# Patient Record
Sex: Male | Born: 1945 | Race: White | Hispanic: No | Smoking: Current every day smoker
Health system: Southern US, Community
[De-identification: ages and names within clinical notes are randomized; demographics above are authoritative.]

## PROBLEM LIST (undated history)

## (undated) DIAGNOSIS — I10 Essential (primary) hypertension: Secondary | ICD-10-CM

## (undated) DIAGNOSIS — R6520 Severe sepsis without septic shock: Secondary | ICD-10-CM

## (undated) DIAGNOSIS — A419 Sepsis, unspecified organism: Secondary | ICD-10-CM

## (undated) DIAGNOSIS — I214 Non-ST elevation (NSTEMI) myocardial infarction: Secondary | ICD-10-CM

## (undated) DIAGNOSIS — K5010 Crohn's disease of large intestine without complications: Secondary | ICD-10-CM

## (undated) DIAGNOSIS — I5021 Acute systolic (congestive) heart failure: Secondary | ICD-10-CM

## (undated) DIAGNOSIS — E119 Type 2 diabetes mellitus without complications: Secondary | ICD-10-CM

## (undated) DIAGNOSIS — I4891 Unspecified atrial fibrillation: Secondary | ICD-10-CM

## (undated) DIAGNOSIS — J9621 Acute and chronic respiratory failure with hypoxia: Secondary | ICD-10-CM

## (undated) DIAGNOSIS — I482 Chronic atrial fibrillation, unspecified: Secondary | ICD-10-CM

## (undated) HISTORY — PX: CAROTID ENDARTERECTOMY: SUR193

---

## 2018-04-26 ENCOUNTER — Inpatient Hospital Stay
Admission: RE | Admit: 2018-04-26 | Discharge: 2018-05-13 | Disposition: A | Discharge status: Home / Self Care | Payer: Medicare HMO | Source: Other Acute Inpatient Hospital | Attending: Internal Medicine | Admitting: Internal Medicine

## 2018-04-26 ENCOUNTER — Other Ambulatory Visit (HOSPITAL_COMMUNITY): Payer: Self-pay

## 2018-04-26 DIAGNOSIS — R140 Abdominal distension (gaseous): Secondary | ICD-10-CM

## 2018-04-26 DIAGNOSIS — R6520 Severe sepsis without septic shock: Secondary | ICD-10-CM

## 2018-04-26 DIAGNOSIS — I482 Chronic atrial fibrillation, unspecified: Secondary | ICD-10-CM | POA: Diagnosis present

## 2018-04-26 DIAGNOSIS — J189 Pneumonia, unspecified organism: Secondary | ICD-10-CM

## 2018-04-26 DIAGNOSIS — J9690 Respiratory failure, unspecified, unspecified whether with hypoxia or hypercapnia: Secondary | ICD-10-CM

## 2018-04-26 DIAGNOSIS — Z978 Presence of other specified devices: Secondary | ICD-10-CM

## 2018-04-26 DIAGNOSIS — Z4659 Encounter for fitting and adjustment of other gastrointestinal appliance and device: Secondary | ICD-10-CM

## 2018-04-26 DIAGNOSIS — Z9289 Personal history of other medical treatment: Secondary | ICD-10-CM

## 2018-04-26 DIAGNOSIS — A419 Sepsis, unspecified organism: Secondary | ICD-10-CM | POA: Diagnosis present

## 2018-04-26 DIAGNOSIS — I5021 Acute systolic (congestive) heart failure: Secondary | ICD-10-CM | POA: Diagnosis present

## 2018-04-26 DIAGNOSIS — I509 Heart failure, unspecified: Secondary | ICD-10-CM

## 2018-04-26 DIAGNOSIS — J9621 Acute and chronic respiratory failure with hypoxia: Secondary | ICD-10-CM | POA: Diagnosis present

## 2018-04-26 DIAGNOSIS — I214 Non-ST elevation (NSTEMI) myocardial infarction: Secondary | ICD-10-CM | POA: Diagnosis present

## 2018-04-26 DIAGNOSIS — Z9911 Dependence on respirator [ventilator] status: Secondary | ICD-10-CM

## 2018-04-26 HISTORY — DX: Acute systolic (congestive) heart failure: I50.21

## 2018-04-26 HISTORY — DX: Sepsis, unspecified organism: A41.9

## 2018-04-26 HISTORY — DX: Chronic atrial fibrillation, unspecified: I48.20

## 2018-04-26 HISTORY — DX: Severe sepsis without septic shock: R65.20

## 2018-04-26 HISTORY — DX: Acute and chronic respiratory failure with hypoxia: J96.21

## 2018-04-26 HISTORY — DX: Non-ST elevation (NSTEMI) myocardial infarction: I21.4

## 2018-04-27 LAB — COMPREHENSIVE METABOLIC PANEL
ALT: 13 U/L (ref 0–44)
AST: 17 U/L (ref 15–41)
Albumin: 2.5 g/dL — ABNORMAL LOW (ref 3.5–5.0)
Alkaline Phosphatase: 38 U/L (ref 38–126)
Anion gap: 6 (ref 5–15)
BUN: 71 mg/dL — AB (ref 8–23)
CHLORIDE: 118 mmol/L — AB (ref 98–111)
CO2: 19 mmol/L — ABNORMAL LOW (ref 22–32)
Calcium: 8.2 mg/dL — ABNORMAL LOW (ref 8.9–10.3)
Creatinine, Ser: 1.81 mg/dL — ABNORMAL HIGH (ref 0.61–1.24)
GFR calc Af Amer: 42 mL/min — ABNORMAL LOW (ref >60)
GFR calc non Af Amer: 37 mL/min — ABNORMAL LOW (ref >60)
Glucose, Bld: 80 mg/dL (ref 70–99)
Potassium: 4.5 mmol/L (ref 3.5–5.1)
Sodium: 143 mmol/L (ref 135–145)
Total Bilirubin: 0.4 mg/dL (ref 0.3–1.2)
Total Protein: 5.3 g/dL — ABNORMAL LOW (ref 6.5–8.1)

## 2018-04-27 LAB — BLOOD GAS, ARTERIAL
ACID-BASE DEFICIT: 4.6 mmol/L — AB (ref 0.0–2.0)
Acid-base deficit: 4.2 mmol/L — ABNORMAL HIGH (ref 0.0–2.0)
Acid-base deficit: 5.2 mmol/L — ABNORMAL HIGH (ref 0.0–2.0)
Acid-base deficit: 3.1 mmol/L — ABNORMAL HIGH (ref 0.0–2.0)
BICARBONATE: 21.9 mmol/L (ref 20.0–28.0)
Bicarbonate: 22.5 mmol/L (ref 20.0–28.0)
Bicarbonate: 21.3 mmol/L (ref 20.0–28.0)
Bicarbonate: 21.9 mmol/L (ref 20.0–28.0)
FIO2: 60
FIO2: 0.6
FIO2: 0.7
FIO2: 60
LHR: 16 {breaths}/min
MECHVT: 440 mL
MECHVT: 440 mL
MECHVT: 440 mL
MECHVT: 440 mL
O2 Saturation: 90.3 %
O2 Saturation: 94.3 %
O2 Saturation: 92 %
O2 Saturation: 98.1 %
PATIENT TEMPERATURE: 98.6
PCO2 ART: 43.2 mmHg (ref 32.0–48.0)
PEEP: 5 cmH2O
PEEP: 5 cmH2O
PEEP: 8 cmH2O
PEEP: 8 cmH2O
Patient temperature: 98.6
Patient temperature: 98.6
Patient temperature: 98.6
RATE: 16 resp/min
RATE: 20 resp/min
RATE: 24 resp/min
pCO2 arterial: 57.6 mmHg — ABNORMAL HIGH (ref 32.0–48.0)
pCO2 arterial: 55.2 mmHg — ABNORMAL HIGH (ref 32.0–48.0)
pCO2 arterial: 53.8 mmHg — ABNORMAL HIGH (ref 32.0–48.0)
pH, Arterial: 7.216 — ABNORMAL LOW (ref 7.350–7.450)
pH, Arterial: 7.222 — ABNORMAL LOW (ref 7.350–7.450)
pH, Arterial: 7.221 — ABNORMAL LOW (ref 7.350–7.450)
pH, Arterial: 7.326 — ABNORMAL LOW (ref 7.350–7.450)
pO2, Arterial: 66.2 mmHg — ABNORMAL LOW (ref 83.0–108.0)
pO2, Arterial: 77 mmHg — ABNORMAL LOW (ref 83.0–108.0)
pO2, Arterial: 69 mmHg — ABNORMAL LOW (ref 83.0–108.0)
pO2, Arterial: 104 mmHg (ref 83.0–108.0)

## 2018-04-27 LAB — URINALYSIS, ROUTINE W REFLEX MICROSCOPIC
Bilirubin Urine: NEGATIVE
Glucose, UA: NEGATIVE mg/dL
Ketones, ur: NEGATIVE mg/dL
Leukocytes,Ua: NEGATIVE
Nitrite: NEGATIVE
Protein, ur: 30 mg/dL — AB
Specific Gravity, Urine: 1.015 (ref 1.005–1.030)
pH: 5 (ref 5.0–8.0)

## 2018-04-27 LAB — CBC
HCT: 31.8 % — ABNORMAL LOW (ref 39.0–52.0)
Hemoglobin: 9.6 g/dL — ABNORMAL LOW (ref 13.0–17.0)
MCH: 27.9 pg (ref 26.0–34.0)
MCHC: 30.2 g/dL (ref 30.0–36.0)
MCV: 92.4 fL (ref 80.0–100.0)
Platelets: 143 10*3/uL — ABNORMAL LOW (ref 150–400)
RBC: 3.44 MIL/uL — ABNORMAL LOW (ref 4.22–5.81)
RDW: 16.1 % — ABNORMAL HIGH (ref 11.5–15.5)
WBC: 9.1 10*3/uL (ref 4.0–10.5)
nRBC: 0 % (ref 0.0–0.2)

## 2018-04-27 NOTE — Consult Note (Signed)
Referring Physician: Hijazi/Li  Richard Wang is an 73 y.o. male.                       Chief Complaint: CAD and CHF  HPI: 73 year old male has acute on chronic respiratory failure following CAD, NSTEMI and right lung pneumonia. He has PMH of hypertenion, CKD, II and COPD. His echocardiogram done at [facility] center showed moderate LV Systolic dysfunction with moderate to severe MR and moderate pulmonary systolic hypertension.  Past medical history as above.    The histories are not reviewed yet. Please review them in the "History" navigator section and refresh this SmartLink.  No family history on file. Social History:  has no history on file for tobacco, alcohol, and drug.  Allergies: Allergies not on file  No medications prior to admission.  See MAR.  Results for orders placed or performed during the hospital encounter of 04/26/18 (from the past 48 hour(s))  Blood gas, arterial     Status: Abnormal   Collection Time: 04/26/18 12:58 AM  Result Value Ref Range   FIO2 60.00    Delivery systems VENTILATOR    Mode PRESSURE REGULATED VOLUME CONTROL    VT 440 mL   LHR 16 resp/min   Peep/cpap 5.0 cm H20   pH, Arterial 7.216 (L) 7.350 - 7.450   pCO2 arterial 57.6 (H) 32.0 - 48.0 mmHg   pO2, Arterial 66.2 (L) 83.0 - 108.0 mmHg   Bicarbonate 22.5 20.0 - 28.0 mmol/L   Acid-base deficit 4.2 (H) 0.0 - 2.0 mmol/L   O2 Saturation 90.3 %   Patient temperature 98.6    Collection site RIGHT RADIAL    Drawn by COLLECTED BY RT    Sample type ARTERIAL DRAW    Allens test (pass/fail) PASS PASS  Comprehensive metabolic panel     Status: Abnormal   Collection Time: 04/27/18  1:24 AM  Result Value Ref Range   Sodium 143 135 - 145 mmol/L   Potassium 4.5 3.5 - 5.1 mmol/L   Chloride 118 (H) 98 - 111 mmol/L   CO2 19 (L) 22 - 32 mmol/L   Glucose, Bld 80 70 - 99 mg/dL   BUN 71 (H) 8 - 23 mg/dL   Creatinine, Ser 1.49 (H) 0.61 - 1.24 mg/dL   Calcium 8.2 (L) 8.9 - 10.3 mg/dL   Total  Protein 5.3 (L) 6.5 - 8.1 g/dL   Albumin 2.5 (L) 3.5 - 5.0 g/dL   AST 17 15 - 41 U/L   ALT 13 0 - 44 U/L   Alkaline Phosphatase 38 38 - 126 U/L   Total Bilirubin 0.4 0.3 - 1.2 mg/dL   GFR calc non Af Amer 37 (L) >60 mL/min   GFR calc Af Amer 42 (L) >60 mL/min   Anion gap 6 5 - 15    Comment: Performed at [facility], [address] [place], [place] [address]  CBC     Status: Abnormal   Collection Time: 04/27/18  1:24 AM  Result Value Ref Range   WBC 9.1 4.0 - 10.5 K/uL   RBC 3.44 (L) 4.22 - 5.81 MIL/uL   Hemoglobin 9.6 (L) 13.0 - 17.0 g/dL   HCT 78.5 (L) 88.5 - 02.7 %   MCV 92.4 80.0 - 100.0 fL   MCH 27.9 26.0 - 34.0 pg   MCHC 30.2 30.0 - 36.0 g/dL   RDW 74.1 (H) 28.7 - 86.7 %   Platelets 143 (  L) 150 - 400 K/uL   nRBC 0.0 0.0 - 0.2 %    Comment: Performed at [facility], [address], [place], [place] [address]  Blood gas, arterial     Status: Abnormal   Collection Time: 04/27/18  4:02 AM  Result Value Ref Range   FIO2 0.60    Delivery systems VENTILATOR    Mode ASSIST CONTROL    VT 440 mL   LHR 16 resp/min   Peep/cpap 5.0 cm H20   pH, Arterial 7.222 (L) 7.350 - 7.450   pCO2 arterial 55.2 (H) 32.0 - 48.0 mmHg   pO2, Arterial 77.0 (L) 83.0 - 108.0 mmHg   Bicarbonate 21.9 20.0 - 28.0 mmol/L   Acid-base deficit 4.6 (H) 0.0 - 2.0 mmol/L   O2 Saturation 94.3 %   Patient temperature 98.6    Collection site RIGHT RADIAL    Drawn by COLLECTED BY RT    Sample type ARTERIAL DRAW    Allens test (pass/fail) PASS PASS  Blood gas, arterial     Status: Abnormal   Collection Time: 04/27/18  6:42 AM  Result Value Ref Range   FIO2 0.70    Delivery systems VENTILATOR    Mode ASSIST CONTROL    VT 440 mL   LHR 20 resp/min   Peep/cpap 8.0 cm H20   pH, Arterial 7.221 (L) 7.350 - 7.450   pCO2 arterial 53.8 (H) 32.0 - 48.0 mmHg   pO2, Arterial 69.0 (L) 83.0 - 108.0 mmHg   Bicarbonate 21.3 20.0 - 28.0 mmol/L   Acid-base deficit 5.2 (H) 0.0 - 2.0 mmol/L   O2 Saturation  92.0 %   Patient temperature 98.6    Collection site RIGHT RADIAL    Drawn by COLLECTED BY RT    Sample type ARTERIAL DRAW    Allens test (pass/fail) PASS PASS  Culture, respiratory     Status: None (Preliminary result)   Collection Time: 04/27/18 12:20 PM  Result Value Ref Range   Specimen Description TRACHEAL ASPIRATE    Special Requests NONE    Gram Stain      FEW WBC PRESENT,BOTH PMN AND MONONUCLEAR NO ORGANISMS SEEN Performed at [facility], [address] [place], [place] [address]    Culture PENDING    Report Status PENDING   Urinalysis, Routine w reflex microscopic     Status: Abnormal   Collection Time: 04/27/18  1:20 PM  Result Value Ref Range   Color, Urine YELLOW YELLOW   APPearance CLEAR CLEAR   Specific Gravity, Urine 1.015 1.005 - 1.030   pH 5.0 5.0 - 8.0   Glucose, UA NEGATIVE NEGATIVE mg/dL   Hgb urine dipstick MODERATE (A) NEGATIVE   Bilirubin Urine NEGATIVE NEGATIVE   Ketones, ur NEGATIVE NEGATIVE mg/dL   Protein, ur 30 (A) NEGATIVE mg/dL   Nitrite NEGATIVE NEGATIVE   Leukocytes,Ua NEGATIVE NEGATIVE   RBC / HPF 21-50 0 - 5 RBC/hpf   WBC, UA 6-10 0 - 5 WBC/hpf   Bacteria, UA FEW (A) NONE SEEN   Mucus PRESENT     Comment: Performed at [facility], [address], [place], [place] [address]   Dg Chest Port 1 View  Result Date: 04/26/2018 CLINICAL DATA:  Endotracheal tube placement. EXAM: PORTABLE CHEST 1 VIEW COMPARISON:  Chest radiograph May 12, 2014 FINDINGS: Endotracheal tube tip projects 3.9 cm above the carina. Nasogastric tube past distal esophagus, tip out of field of view. Lung bases out of field  of view. Cardiomediastinal silhouette is normal. Calcified aortic arch. Diffuse interstitial and alveolar airspace opacities. Limited assessment for pleural effusion. No pneumothorax. Surgical clips versus calcifications in the neck. IMPRESSION: 1. Endotracheal tube tip projects 3.9 cm above the carina, nasogastric tube past distal esophagus. 2.  Interstitial and alveolar airspace opacities seen with pulmonary edema and/or infection. 3.  Aortic Atherosclerosis (ICD10-I70.0). Electronically Signed   By: Awilda Metro M.D.   On: 04/26/2018 23:34   Dg Abd Portable 1v  Result Date: 04/26/2018 CLINICAL DATA:  NG tube placement EXAM: PORTABLE ABDOMEN - 1 VIEW COMPARISON:  CT 03/21/2016 FINDINGS: Bilateral pleural effusions and basilar airspace disease. Aortic atherosclerosis. Esophageal tube tip in the low mid abdominal region, presumably over the mid stomach. Gasless abdomen IMPRESSION: 1. Esophageal tube tip projects over the mid gastric region 2. Bilateral pleural effusions and basilar airspace disease Electronically Signed   By: Jasmine Pang M.D.   On: 04/26/2018 23:35    Review Of Systems Unable to obtain and as per PMH.   There were no vitals taken for this visit. There is no height or weight on file to calculate BMI. General appearance: Sedated and trached. Appears stated age and no distress Head: Normocephalic, atraumatic. Eyes: pink conjunctiva, corneas clear.  Neck: No adenopathy, no carotid bruit, no JVD, supple, symmetrical, trachea midline and thyroid not enlarged.  Resp: Clearing to auscultation bilaterally. Cardio: Irregular rate and rhythm, S1, S2 normal, III/VI systolic murmur, no click, rub or gallop GI: Soft, non-tender; bowel sounds normal; no organomegaly. Extremities: 1 + edema, cyanosis or clubbing. Skin: Warm and dry.  Neurologic: Alert and oriented X 0, normal strength.   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 Add diltiazem 30 mg per tube twice daily and increase dose as needed for heart rate control.   Ricki Rodriguez, MD  04/27/2018, 4:57 PM

## 2018-04-28 ENCOUNTER — Other Ambulatory Visit (HOSPITAL_COMMUNITY): Payer: Self-pay

## 2018-04-28 LAB — RENAL FUNCTION PANEL
Albumin: 2.3 g/dL — ABNORMAL LOW (ref 3.5–5.0)
Anion gap: 7 (ref 5–15)
BUN: 70 mg/dL — ABNORMAL HIGH (ref 8–23)
CHLORIDE: 113 mmol/L — AB (ref 98–111)
CO2: 22 mmol/L (ref 22–32)
Calcium: 8.6 mg/dL — ABNORMAL LOW (ref 8.9–10.3)
Creatinine, Ser: 1.48 mg/dL — ABNORMAL HIGH (ref 0.61–1.24)
GFR calc Af Amer: 54 mL/min — ABNORMAL LOW (ref >60)
GFR calc non Af Amer: 47 mL/min — ABNORMAL LOW (ref >60)
Glucose, Bld: 135 mg/dL — ABNORMAL HIGH (ref 70–99)
Phosphorus: 4.8 mg/dL — ABNORMAL HIGH (ref 2.5–4.6)
Potassium: 5.5 mmol/L — ABNORMAL HIGH (ref 3.5–5.1)
Sodium: 142 mmol/L (ref 135–145)

## 2018-04-28 LAB — BRAIN NATRIURETIC PEPTIDE: B Natriuretic Peptide: 1239.6 pg/mL — ABNORMAL HIGH (ref 0.0–100.0)

## 2018-04-28 LAB — TSH: TSH: 0.109 u[IU]/mL — ABNORMAL LOW (ref 0.350–4.500)

## 2018-04-28 LAB — URINE CULTURE: Culture: NO GROWTH

## 2018-04-28 LAB — CBC
HCT: 30 % — ABNORMAL LOW (ref 39.0–52.0)
Hemoglobin: 9 g/dL — ABNORMAL LOW (ref 13.0–17.0)
MCH: 27.1 pg (ref 26.0–34.0)
MCHC: 30 g/dL (ref 30.0–36.0)
MCV: 90.4 fL (ref 80.0–100.0)
Platelets: 165 10*3/uL (ref 150–400)
RBC: 3.32 MIL/uL — ABNORMAL LOW (ref 4.22–5.81)
RDW: 15.8 % — ABNORMAL HIGH (ref 11.5–15.5)
WBC: 8.9 10*3/uL (ref 4.0–10.5)
nRBC: 0 % (ref 0.0–0.2)

## 2018-04-28 LAB — MAGNESIUM: MAGNESIUM: 1.9 mg/dL (ref 1.7–2.4)

## 2018-04-28 LAB — HEMOGLOBIN A1C
Hgb A1c MFr Bld: 5 % (ref 4.8–5.6)
Mean Plasma Glucose: 96.8 mg/dL

## 2018-04-29 DIAGNOSIS — I5021 Acute systolic (congestive) heart failure: Secondary | ICD-10-CM

## 2018-04-29 DIAGNOSIS — J9621 Acute and chronic respiratory failure with hypoxia: Secondary | ICD-10-CM | POA: Diagnosis not present

## 2018-04-29 DIAGNOSIS — R6520 Severe sepsis without septic shock: Secondary | ICD-10-CM

## 2018-04-29 DIAGNOSIS — A419 Sepsis, unspecified organism: Secondary | ICD-10-CM

## 2018-04-29 DIAGNOSIS — I214 Non-ST elevation (NSTEMI) myocardial infarction: Secondary | ICD-10-CM

## 2018-04-29 DIAGNOSIS — I482 Chronic atrial fibrillation, unspecified: Secondary | ICD-10-CM

## 2018-04-29 LAB — RENAL FUNCTION PANEL
Albumin: 2.5 g/dL — ABNORMAL LOW (ref 3.5–5.0)
Anion gap: 6 (ref 5–15)
BUN: 73 mg/dL — ABNORMAL HIGH (ref 8–23)
CO2: 25 mmol/L (ref 22–32)
Calcium: 8.8 mg/dL — ABNORMAL LOW (ref 8.9–10.3)
Chloride: 111 mmol/L (ref 98–111)
Creatinine, Ser: 1.51 mg/dL — ABNORMAL HIGH (ref 0.61–1.24)
GFR calc Af Amer: 53 mL/min — ABNORMAL LOW (ref >60)
GFR calc non Af Amer: 45 mL/min — ABNORMAL LOW (ref >60)
Glucose, Bld: 139 mg/dL — ABNORMAL HIGH (ref 70–99)
Phosphorus: 5.2 mg/dL — ABNORMAL HIGH (ref 2.5–4.6)
Potassium: 5.2 mmol/L — ABNORMAL HIGH (ref 3.5–5.1)
Sodium: 142 mmol/L (ref 135–145)

## 2018-04-29 LAB — MAGNESIUM: MAGNESIUM: 2 mg/dL (ref 1.7–2.4)

## 2018-04-29 LAB — CULTURE, RESPIRATORY W GRAM STAIN: Culture: NO GROWTH

## 2018-04-29 LAB — T4, FREE: Free T4: 1.25 ng/dL (ref 0.82–1.77)

## 2018-04-29 LAB — CBC
HCT: 31.1 % — ABNORMAL LOW (ref 39.0–52.0)
Hemoglobin: 9.5 g/dL — ABNORMAL LOW (ref 13.0–17.0)
MCH: 27.3 pg (ref 26.0–34.0)
MCHC: 30.5 g/dL (ref 30.0–36.0)
MCV: 89.4 fL (ref 80.0–100.0)
Platelets: 162 10*3/uL (ref 150–400)
RBC: 3.48 MIL/uL — ABNORMAL LOW (ref 4.22–5.81)
RDW: 15.5 % (ref 11.5–15.5)
WBC: 9.3 10*3/uL (ref 4.0–10.5)
nRBC: 0.2 % (ref 0.0–0.2)

## 2018-04-29 NOTE — Consult Note (Addendum)
Pulmonary Critical Care Medicine [facility] [place]  PULMONARY SERVICE  Date of Service: 04/29/2018  PULMONARY CRITICAL CARE Richard Wang  YEM:[id number]  DOB: [birth year]   DOA: 04/26/2018  Referring Physician: Carron Curie, MD  HPI: Richard Wang is a 73 y.o. male seen for follow up of Acute on Chronic Respiratory Failure.  Patient has a past medical history significant for COPD pneumonia coronary artery disease non-STEMI renal insufficiency atrial fibrillation hypertension.  Patient was admitted with increasing respiratory distress was started on BiPAP however patient deteriorated and ended up having to be intubated placed on the ventilator.  Subsequently had complications associated with worsening renal function congestive heart failure with reduced ejection fraction pulmonary hypertension.  Patient was attempted extubation however failed and eventually transferred to our facility for further management.  Patient remains on propofol and fentanyl for sedation at this time  Review of Systems:  ROS performed and is unremarkable other than noted above.  Past Medical History:  Diagnosis Date  . Cancer (HCC)  . Hypertension  . Thrombophlebitis of deep femoral vein (HCC)   Past Surgical History:  Procedure Laterality Date  . LITHOTRIPSY  Renal  . PROSTATE SURGERY   Family History  Problem Relation Age of Onset  . Glaucoma Neg Hx  . Macular degeneration Neg Hx   Social History   Socioeconomic History  . Marital status: Married  Spouse name: Not on file  . Number of children: Not on file  . Years of education: Not on file  . Highest education level: Not on file  Occupational History  . Not on file  Social Needs  . Financial resource strain: Not on file  . Food insecurity:  Worry: Not on file  Inability: Not on file  . Transportation needs:  Medical: Not on file  Non-medical: Not on file  Tobacco Use  . Smoking status: Former Smoker  Last attempt to  quit: 05/23/1967  Years since quitting: 50.8  . Smokeless tobacco: Never Used  Substance and Sexual Activity  . Alcohol use: No  . Drug use: No    Medications: Reviewed on Rounds  Physical Exam:  Vitals: Temperature 98.0 pulse 85 respiratory 24 blood pressure 128/79 saturations 100%  Ventilator Settings mode of ventilation assist control FiO2 60% tidal volume 457 PEEP 8  . General: Comfortable at this time . Eyes: Grossly normal lids, irises & conjunctiva . ENT: grossly tongue is normal . Neck: no obvious mass . Cardiovascular: S1-S2 normal no gallop or rub is noted . Respiratory: Coarse breath sounds with few scattered rhonchi . Abdomen: Soft and nontender . Skin: no rash seen on limited exam . Musculoskeletal: not rigid . Psychiatric:unable to assess . Neurologic: no seizure no involuntary movements         Labs on Admission:  Basic Metabolic Panel: Recent Labs  Lab 04/27/18 0124 04/28/18 1052 04/29/18 0453  NA 143 142 142  K 4.5 5.5* 5.2*  CL 118* 113* 111  CO2 19* 22 25  GLUCOSE 80 135* 139*  BUN 71* 70* 73*  CREATININE 1.81* 1.48* 1.51*  CALCIUM 8.2* 8.6* 8.8*  MG  --  1.9 2.0  PHOS  --  4.8* 5.2*    Recent Labs  Lab 04/26/18 0058 04/27/18 0402 04/27/18 0642 04/27/18 1813  PHART 7.216* 7.222* 7.221* 7.326*  PCO2ART 57.6* 55.2* 53.8* 43.2  PO2ART 66.2* 77.0* 69.0* 104  HCO3 22.5 21.9 21.3 21.9  O2SAT 90.3 94.3 92.0 98.1    Liver Function Tests: Recent  Labs  Lab 04/27/18 0124 04/28/18 1052 04/29/18 0453  AST 17  --   --   ALT 13  --   --   ALKPHOS 38  --   --   BILITOT 0.4  --   --   PROT 5.3*  --   --   ALBUMIN 2.5* 2.3* 2.5*   No results for input(s): LIPASE, AMYLASE in the last 168 hours. No results for input(s): AMMONIA in the last 168 hours.  CBC: Recent Labs  Lab 04/27/18 0124 04/28/18 1332 04/29/18 0453  WBC 9.1 8.9 9.3  HGB 9.6* 9.0* 9.5*  HCT 31.8* 30.0* 31.1*  MCV 92.4 90.4 89.4  PLT 143* 165 162    Cardiac  Enzymes: No results for input(s): CKTOTAL, CKMB, CKMBINDEX, TROPONINI in the last 168 hours.  BNP (last 3 results) Recent Labs    04/28/18 1052  BNP 1,239.6*    ProBNP (last 3 results) No results for input(s): PROBNP in the last 8760 hours.   Radiological Exams on Admission: Dg Chest Port 1 View  Result Date: 04/28/2018 CLINICAL DATA:  Intubation.  Pneumonia.  Congestive heart failure. EXAM: PORTABLE CHEST 1 VIEW COMPARISON:  04/26/2018 FINDINGS: Endotracheal tube terminates 6.0 cm above carina. Nasogastric tube extends beyond the inferior aspect of the film. Upper normal heart size, accentuated by AP portable technique. Atherosclerosis in the transverse aorta. Probable layering small bilateral pleural effusions. No pneumothorax. Improved aeration, with decreased interstitial and airspace disease which is lower lung predominant. IMPRESSION: Improvement in congestive heart failure. Concurrent improved lower lobe predominant alveolar opacities which could also relate to failure or pneumonia. Aortic Atherosclerosis (ICD10-I70.0). Electronically Signed   By: Jeronimo Greaves M.D.   On: 04/28/2018 07:51   Dg Chest Port 1 View  Result Date: 04/26/2018 CLINICAL DATA:  Endotracheal tube placement. EXAM: PORTABLE CHEST 1 VIEW COMPARISON:  Chest radiograph May 12, 2014 FINDINGS: Endotracheal tube tip projects 3.9 cm above the carina. Nasogastric tube past distal esophagus, tip out of field of view. Lung bases out of field of view. Cardiomediastinal silhouette is normal. Calcified aortic arch. Diffuse interstitial and alveolar airspace opacities. Limited assessment for pleural effusion. No pneumothorax. Surgical clips versus calcifications in the neck. IMPRESSION: 1. Endotracheal tube tip projects 3.9 cm above the carina, nasogastric tube past distal esophagus. 2. Interstitial and alveolar airspace opacities seen with pulmonary edema and/or infection. 3.  Aortic Atherosclerosis (ICD10-I70.0). Electronically  Signed   By: Awilda Metro M.D.   On: 04/26/2018 23:34   Dg Abd Portable 1v  Result Date: 04/26/2018 CLINICAL DATA:  NG tube placement EXAM: PORTABLE ABDOMEN - 1 VIEW COMPARISON:  CT 03/21/2016 FINDINGS: Bilateral pleural effusions and basilar airspace disease. Aortic atherosclerosis. Esophageal tube tip in the low mid abdominal region, presumably over the mid stomach. Gasless abdomen IMPRESSION: 1. Esophageal tube tip projects over the mid gastric region 2. Bilateral pleural effusions and basilar airspace disease Electronically Signed   By: Jasmine Pang M.D.   On: 04/26/2018 23:35    Assessment/Plan Active Problems:   Acute on chronic respiratory failure with hypoxia (HCC)   Severe sepsis (HCC)   Chronic atrial fibrillation   Non-STEMI (non-ST elevated myocardial infarction) (HCC)   Acute systolic heart failure (HCC)   1. Acute on chronic respiratory failure with hypoxia patient will be continued on full support is intubated endotracheally.  Patient is on propofol as well as fentanyl for sedation.  Other issues are including the cardiac failure and coronary disease cardiology consultation was asked to see  the patient 2. Severe sepsis syndrome patient right now is hemodynamically stable we will continue with off pressors as necessary.  Fluids as necessary patient has been treated with antibiotics. 3. Chronic atrial fibrillation rate is controlled at this time we will continue with supportive care. 4. Non-STEMI medical management will continue with supportive care. 5. Acute systolic heart failure on the last echo patient had moderate reduction in the left ventricular function.  We will monitor fluid status closely also follow-up with echo cardiogram and follow-up cardiology recommendations  I have personally seen and evaluated the patient, evaluated laboratory and imaging results, formulated the assessment and plan and placed orders.  Patient is critically ill in danger of cardiac arrest and  death.  Patient has a high risk airway with endotracheal tube in place patient is critically ill in danger of cardiac arrest and death requires frequent titration of complex drips and has a high risk airway orally intubated The Patient requires high complexity decision making for assessment and support.  Case was discussed on Rounds with the Respiratory Therapy Staff Time Spent  Yevonne Pax, MD [facility] Pulmonary Critical Care Medicine Sleep Medicine

## 2018-04-30 DIAGNOSIS — I5021 Acute systolic (congestive) heart failure: Secondary | ICD-10-CM | POA: Diagnosis not present

## 2018-04-30 DIAGNOSIS — I482 Chronic atrial fibrillation, unspecified: Secondary | ICD-10-CM | POA: Diagnosis not present

## 2018-04-30 DIAGNOSIS — J9621 Acute and chronic respiratory failure with hypoxia: Secondary | ICD-10-CM | POA: Diagnosis not present

## 2018-04-30 DIAGNOSIS — I214 Non-ST elevation (NSTEMI) myocardial infarction: Secondary | ICD-10-CM | POA: Diagnosis not present

## 2018-04-30 LAB — BASIC METABOLIC PANEL
Anion gap: 9 (ref 5–15)
BUN: 70 mg/dL — ABNORMAL HIGH (ref 8–23)
CHLORIDE: 110 mmol/L (ref 98–111)
CO2: 24 mmol/L (ref 22–32)
Calcium: 9.2 mg/dL (ref 8.9–10.3)
Creatinine, Ser: 1.44 mg/dL — ABNORMAL HIGH (ref 0.61–1.24)
GFR calc Af Amer: 56 mL/min — ABNORMAL LOW (ref >60)
GFR calc non Af Amer: 48 mL/min — ABNORMAL LOW (ref >60)
Glucose, Bld: 157 mg/dL — ABNORMAL HIGH (ref 70–99)
Potassium: 5.1 mmol/L (ref 3.5–5.1)
Sodium: 143 mmol/L (ref 135–145)

## 2018-04-30 NOTE — Progress Notes (Addendum)
Pulmonary Critical Care Medicine [facility] [place]   PULMONARY CRITICAL CARE SERVICE  PROGRESS NOTE  Date of Service: 04/30/2018  Nysir Krishnamoorthy  RKY:[id number]  DOB: [birth year]   DOA: 04/26/2018  Referring Physician: Carron Curie, MD  HPI: Richard Wang is a 73 y.o. male seen for follow up of Acute on Chronic Respiratory Failure.  Patient remains orally intubated on propofol and fentanyl drips.  On full support on ventilator with an FiO2 of 45%.  Patient is not able to do any weaning at this time because of sedation  Medications: Reviewed on Rounds  Physical Exam:  Vitals: Pulse 83 respirations 24 BP 102/59 O2 sat 100% temp 99.2  Ventilator Settings ventilator mode AC VC FiO2 45% PEEP of 8 rate of 24 tidal volume 440  . General: Comfortable at this time . Eyes: Grossly normal lids, irises & conjunctiva . ENT: grossly tongue is normal . Neck: no obvious mass . Cardiovascular: S1 S2 normal no gallop . Respiratory: Coarse breath sounds . Abdomen: soft . Skin: no rash seen on limited exam . Musculoskeletal: not rigid . Psychiatric:unable to assess . Neurologic: no seizure no involuntary movements         Lab Data:   Basic Metabolic Panel: Recent Labs  Lab 04/27/18 0124 04/28/18 1052 04/29/18 0453 04/30/18 0520  NA 143 142 142 143  K 4.5 5.5* 5.2* 5.1  CL 118* 113* 111 110  CO2 19* 22 25 24   GLUCOSE 80 135* 139* 157*  BUN 71* 70* 73* 70*  CREATININE 1.81* 1.48* 1.51* 1.44*  CALCIUM 8.2* 8.6* 8.8* 9.2  MG  --  1.9 2.0  --   PHOS  --  4.8* 5.2*  --     ABG: Recent Labs  Lab 04/26/18 0058 04/27/18 0402 04/27/18 0642 04/27/18 1813  PHART 7.216* 7.222* 7.221* 7.326*  PCO2ART 57.6* 55.2* 53.8* 43.2  PO2ART 66.2* 77.0* 69.0* 104  HCO3 22.5 21.9 21.3 21.9  O2SAT 90.3 94.3 92.0 98.1    Liver Function Tests: Recent Labs  Lab 04/27/18 0124 04/28/18 1052 04/29/18 0453  AST 17  --   --   ALT 13  --   --   ALKPHOS 38  --   --   BILITOT 0.4  --   --    PROT 5.3*  --   --   ALBUMIN 2.5* 2.3* 2.5*   No results for input(s): LIPASE, AMYLASE in the last 168 hours. No results for input(s): AMMONIA in the last 168 hours.  CBC: Recent Labs  Lab 04/27/18 0124 04/28/18 1332 04/29/18 0453  WBC 9.1 8.9 9.3  HGB 9.6* 9.0* 9.5*  HCT 31.8* 30.0* 31.1*  MCV 92.4 90.4 89.4  PLT 143* 165 162    Cardiac Enzymes: No results for input(s): CKTOTAL, CKMB, CKMBINDEX, TROPONINI in the last 168 hours.  BNP (last 3 results) Recent Labs    04/28/18 1052  BNP 1,239.6*    ProBNP (last 3 results) No results for input(s): PROBNP in the last 8760 hours.  Radiological Exams: No results found.  Assessment/Plan Active Problems:   Acute on chronic respiratory failure with hypoxia (HCC)   Severe sepsis (HCC)   Chronic atrial fibrillation   Non-STEMI (non-ST elevated myocardial infarction) (HCC)   Acute systolic heart failure (HCC)   1. Acute on chronic respiratory failure with hypoxia continue on full support with patient intubated.  Patient will continue on propofol and fentanyl for sedation. 2. Severe sepsis syndrome hemodynamically stable.  Continue current management. 3. Chronic  atrial fibrillation rate controlled continue supportive care 4. Non-STEMI medical management continue supportive care 5. Acute systolic heart failure continue to monitor fluid status obtain follow-up echo per cardiology recommendations   I have personally seen and evaluated the patient, evaluated laboratory and imaging results, formulated the assessment and plan and placed orders.  Time 35 minutes patient is critically ill in danger of cardiac arrest and death requiring advanced titration of high risk drips patient is orally intubated The Patient requires high complexity decision making for assessment and support.  Case was discussed on Rounds with the Respiratory Therapy Staff  Yevonne Pax, MD [facility] Pulmonary Critical Care Medicine Sleep Medicine

## 2018-05-01 ENCOUNTER — Other Ambulatory Visit (HOSPITAL_COMMUNITY): Payer: Self-pay

## 2018-05-01 DIAGNOSIS — J9621 Acute and chronic respiratory failure with hypoxia: Secondary | ICD-10-CM | POA: Diagnosis not present

## 2018-05-01 DIAGNOSIS — I5021 Acute systolic (congestive) heart failure: Secondary | ICD-10-CM | POA: Diagnosis not present

## 2018-05-01 DIAGNOSIS — I482 Chronic atrial fibrillation, unspecified: Secondary | ICD-10-CM | POA: Diagnosis not present

## 2018-05-01 DIAGNOSIS — I214 Non-ST elevation (NSTEMI) myocardial infarction: Secondary | ICD-10-CM | POA: Diagnosis not present

## 2018-05-01 LAB — POTASSIUM: Potassium: 4.7 mmol/L (ref 3.5–5.1)

## 2018-05-01 NOTE — Progress Notes (Addendum)
Pulmonary Critical Care Medicine [facility] [place]   PULMONARY CRITICAL CARE SERVICE  PROGRESS NOTE  Date of Service: 05/01/2018  Richard Wang  [id number]  DOB: 08-07-1945   DOA: 04/26/2018  Referring Physician: Carron Curie, MD  HPI: Richard Wang is a 73 y.o. male seen for follow up of Acute on Chronic Respiratory Failure.  Patient remains intubated and sedated on propofol and fentanyl.  Today respiratory therapy was able to decrease the patient's PEEP from 8-5 and FiO2 from 60 to 40%.  Medications: Reviewed on Rounds  Physical Exam:  Vitals: Pulse 106 respirations 24 BP 107/59 O2 sat 98% temp 97.4  Ventilator Settings ventilator mode AC VC rate of 24 PEEP of 5 FiO2 40% tidal volume 440  . General: Comfortable at this time . Eyes: Grossly normal lids, irises & conjunctiva . ENT: grossly tongue is normal . Neck: no obvious mass . Cardiovascular: S1 S2 normal no gallop . Respiratory: Coarse breath sounds . Abdomen: soft . Skin: no rash seen on limited exam . Musculoskeletal: not rigid . Psychiatric:unable to assess . Neurologic: no seizure no involuntary movements         Lab Data:   Basic Metabolic Panel: Recent Labs  Lab 04/27/18 0124 04/28/18 1052 04/29/18 0453 04/30/18 0520 05/01/18 0534  NA 143 142 142 143  --   K 4.5 5.5* 5.2* 5.1 4.7  CL 118* 113* 111 110  --   CO2 19* 22 25 24   --   GLUCOSE 80 135* 139* 157*  --   BUN 71* 70* 73* 70*  --   CREATININE 1.81* 1.48* 1.51* 1.44*  --   CALCIUM 8.2* 8.6* 8.8* 9.2  --   MG  --  1.9 2.0  --   --   PHOS  --  4.8* 5.2*  --   --     ABG: Recent Labs  Lab 04/26/18 0058 04/27/18 0402 04/27/18 0642 04/27/18 1813  PHART 7.216* 7.222* 7.221* 7.326*  PCO2ART 57.6* 55.2* 53.8* 43.2  PO2ART 66.2* 77.0* 69.0* 104  HCO3 22.5 21.9 21.3 21.9  O2SAT 90.3 94.3 92.0 98.1    Liver Function Tests: Recent Labs  Lab 04/27/18 0124 04/28/18 1052 04/29/18 0453  AST 17  --   --   ALT 13  --   --    ALKPHOS 38  --   --   BILITOT 0.4  --   --   PROT 5.3*  --   --   ALBUMIN 2.5* 2.3* 2.5*   No results for input(s): LIPASE, AMYLASE in the last 168 hours. No results for input(s): AMMONIA in the last 168 hours.  CBC: Recent Labs  Lab 04/27/18 0124 04/28/18 1332 04/29/18 0453  WBC 9.1 8.9 9.3  HGB 9.6* 9.0* 9.5*  HCT 31.8* 30.0* 31.1*  MCV 92.4 90.4 89.4  PLT 143* 165 162    Cardiac Enzymes: No results for input(s): CKTOTAL, CKMB, CKMBINDEX, TROPONINI in the last 168 hours.  BNP (last 3 results) Recent Labs    04/28/18 1052  BNP 1,239.6*    ProBNP (last 3 results) No results for input(s): PROBNP in the last 8760 hours.  Radiological Exams: Dg Abd 1 View  Result Date: 05/01/2018 CLINICAL DATA:  Abdominal distension EXAM: ABDOMEN - 1 VIEW COMPARISON:  04/26/2018 FINDINGS: Nonobstructive bowel gas pattern. Enteric tube terminates in the distal gastric body. Mild degenerative changes of the lower lumbar spine. IMPRESSION: Enteric tube terminates in the distal gastric body. Electronically Signed   By: Lurlean Horns  Rito Ehrlich M.D.   On: 05/01/2018 18:21    Assessment/Plan Active Problems:   Acute on chronic respiratory failure with hypoxia (HCC)   Severe sepsis (HCC)   Chronic atrial fibrillation   Non-STEMI (non-ST elevated myocardial infarction) (HCC)   Acute systolic heart failure (HCC)   1. Acute on chronic respiratory failure with hypoxia continue on full support patient intubated patient continue propofol and fentanyl for sedation at this time.  Continue slow weaning process. 2. Severe sepsis syndrome hemodynamically stable continue current management 3. Chronic atrial fibrillation rate controlled continue supportive care 4. Non-STEMI medical management continue supportive care 5. Acute systolic heart failure continue to monitor fluid status and obtain follow-up echo per cardiology recommendations   I have personally seen and evaluated the patient, evaluated  laboratory and imaging results, formulated the assessment and plan and placed orders. The Patient requires high complexity decision making for assessment and support.  Case was discussed on Rounds with the Respiratory Therapy Staff  Yevonne Pax, MD [facility] Pulmonary Critical Care Medicine Sleep Medicine

## 2018-05-02 DIAGNOSIS — J9621 Acute and chronic respiratory failure with hypoxia: Secondary | ICD-10-CM | POA: Diagnosis not present

## 2018-05-02 DIAGNOSIS — I482 Chronic atrial fibrillation, unspecified: Secondary | ICD-10-CM | POA: Diagnosis not present

## 2018-05-02 DIAGNOSIS — I5021 Acute systolic (congestive) heart failure: Secondary | ICD-10-CM | POA: Diagnosis not present

## 2018-05-02 DIAGNOSIS — I214 Non-ST elevation (NSTEMI) myocardial infarction: Secondary | ICD-10-CM | POA: Diagnosis not present

## 2018-05-02 NOTE — Progress Notes (Addendum)
Pulmonary Critical Care Medicine [facility] [place]   PULMONARY CRITICAL CARE SERVICE  PROGRESS NOTE  Date of Service: 05/02/2018  Richard Wang  WTU:[id number]  DOB: 01/27/1946   DOA: 04/26/2018  Referring Physician: Carron Curie, MD  HPI: Richard Wang is a 73 y.o. male seen for follow up of Acute on Chronic Respiratory Failure.  Patient is on full support right now on assist control FiO2 30% with a PEEP of 5  Medications: Reviewed on Rounds  Physical Exam:  Vitals: Temperature 98.5 pulse 99 respiratory rate 20 blood pressure 180/95 saturations 100%  Ventilator Settings on assist control FiO2 30% tidal line 459 PEEP 5  . General: Comfortable at this time . Eyes: Grossly normal lids, irises & conjunctiva . ENT: grossly tongue is normal . Neck: no obvious mass . Cardiovascular: S1 S2 normal no gallop . Respiratory: No rhonchi or rales are noted . Abdomen: soft . Skin: no rash seen on limited exam . Musculoskeletal: not rigid . Psychiatric:unable to assess . Neurologic: no seizure no involuntary movements         Lab Data:   Basic Metabolic Panel: Recent Labs  Lab 04/27/18 0124 04/28/18 1052 04/29/18 0453 04/30/18 0520 05/01/18 0534  NA 143 142 142 143  --   K 4.5 5.5* 5.2* 5.1 4.7  CL 118* 113* 111 110  --   CO2 19* 22 25 24   --   GLUCOSE 80 135* 139* 157*  --   BUN 71* 70* 73* 70*  --   CREATININE 1.81* 1.48* 1.51* 1.44*  --   CALCIUM 8.2* 8.6* 8.8* 9.2  --   MG  --  1.9 2.0  --   --   PHOS  --  4.8* 5.2*  --   --     ABG: Recent Labs  Lab 04/26/18 0058 04/27/18 0402 04/27/18 0642 04/27/18 1813  PHART 7.216* 7.222* 7.221* 7.326*  PCO2ART 57.6* 55.2* 53.8* 43.2  PO2ART 66.2* 77.0* 69.0* 104  HCO3 22.5 21.9 21.3 21.9  O2SAT 90.3 94.3 92.0 98.1    Liver Function Tests: Recent Labs  Lab 04/27/18 0124 04/28/18 1052 04/29/18 0453  AST 17  --   --   ALT 13  --   --   ALKPHOS 38  --   --   BILITOT 0.4  --   --   PROT 5.3*  --   --    ALBUMIN 2.5* 2.3* 2.5*   No results for input(s): LIPASE, AMYLASE in the last 168 hours. No results for input(s): AMMONIA in the last 168 hours.  CBC: Recent Labs  Lab 04/27/18 0124 04/28/18 1332 04/29/18 0453  WBC 9.1 8.9 9.3  HGB 9.6* 9.0* 9.5*  HCT 31.8* 30.0* 31.1*  MCV 92.4 90.4 89.4  PLT 143* 165 162    Cardiac Enzymes: No results for input(s): CKTOTAL, CKMB, CKMBINDEX, TROPONINI in the last 168 hours.  BNP (last 3 results) Recent Labs    04/28/18 1052  BNP 1,239.6*    ProBNP (last 3 results) No results for input(s): PROBNP in the last 8760 hours.  Radiological Exams: Dg Abd 1 View  Result Date: 05/01/2018 CLINICAL DATA:  Abdominal distension EXAM: ABDOMEN - 1 VIEW COMPARISON:  04/26/2018 FINDINGS: Nonobstructive bowel gas pattern. Enteric tube terminates in the distal gastric body. Mild degenerative changes of the lower lumbar spine. IMPRESSION: Enteric tube terminates in the distal gastric body. Electronically Signed   By: Charline Bills M.D.   On: 05/01/2018 18:21    Assessment/Plan Active  Problems:   Acute on chronic respiratory failure with hypoxia (HCC)   Severe sepsis (HCC)   Chronic atrial fibrillation   Non-STEMI (non-ST elevated myocardial infarction) (HCC)   Acute systolic heart failure (HCC)   1. Acute on chronic respiratory failure with hypoxia continue with full vent support at this time patient still requiring propofol for sedation titrate down as tolerated 2. Severe sepsis hemodynamically remains stable continue to monitor 3. Chronic atrial fibrillation rate is controlled we will continue with supportive care 4. Non-STEMI unchanged 5. Acute systolic heart failure at baseline we will continue supportive care   I have personally seen and evaluated the patient, evaluated laboratory and imaging results, formulated the assessment and plan and placed orders. The Patient requires high complexity decision making for assessment and support.   Case was discussed on Rounds with the Respiratory Therapy Staff  Yevonne Pax, MD [facility] Pulmonary Critical Care Medicine Sleep Medicine

## 2018-05-03 DIAGNOSIS — I214 Non-ST elevation (NSTEMI) myocardial infarction: Secondary | ICD-10-CM | POA: Diagnosis not present

## 2018-05-03 DIAGNOSIS — I482 Chronic atrial fibrillation, unspecified: Secondary | ICD-10-CM | POA: Diagnosis not present

## 2018-05-03 DIAGNOSIS — J9621 Acute and chronic respiratory failure with hypoxia: Secondary | ICD-10-CM | POA: Diagnosis not present

## 2018-05-03 DIAGNOSIS — I5021 Acute systolic (congestive) heart failure: Secondary | ICD-10-CM | POA: Diagnosis not present

## 2018-05-03 LAB — CBC
HEMATOCRIT: 34.1 % — AB (ref 39.0–52.0)
Hemoglobin: 10.4 g/dL — ABNORMAL LOW (ref 13.0–17.0)
MCH: 27.9 pg (ref 26.0–34.0)
MCHC: 30.5 g/dL (ref 30.0–36.0)
MCV: 91.4 fL (ref 80.0–100.0)
Platelets: 150 10*3/uL (ref 150–400)
RBC: 3.73 MIL/uL — ABNORMAL LOW (ref 4.22–5.81)
RDW: 15.8 % — ABNORMAL HIGH (ref 11.5–15.5)
WBC: 13.3 10*3/uL — ABNORMAL HIGH (ref 4.0–10.5)
nRBC: 0 % (ref 0.0–0.2)

## 2018-05-03 LAB — BASIC METABOLIC PANEL
Anion gap: 11 (ref 5–15)
BUN: 60 mg/dL — ABNORMAL HIGH (ref 8–23)
CO2: 23 mmol/L (ref 22–32)
Calcium: 9.3 mg/dL (ref 8.9–10.3)
Chloride: 111 mmol/L (ref 98–111)
Creatinine, Ser: 1.12 mg/dL (ref 0.61–1.24)
GFR calc Af Amer: >60 mL/min (ref >60)
GFR calc non Af Amer: >60 mL/min (ref >60)
Glucose, Bld: 107 mg/dL — ABNORMAL HIGH (ref 70–99)
Potassium: 4.1 mmol/L (ref 3.5–5.1)
Sodium: 145 mmol/L (ref 135–145)

## 2018-05-03 LAB — MAGNESIUM: Magnesium: 2 mg/dL (ref 1.7–2.4)

## 2018-05-03 NOTE — Progress Notes (Addendum)
Pulmonary Critical Care Medicine [facility] [place]   PULMONARY CRITICAL CARE SERVICE  PROGRESS NOTE  Date of Service: 05/03/2018  Richard Wang  GBT:[id number]  DOB: 1945/04/18   DOA: 04/26/2018  Referring Physician: Carron Curie, MD  HPI: Richard Wang is a 73 y.o. male seen for follow up of Acute on Chronic Respiratory Failure.  Patient is comfortable right now without distress remains on assist control mode remains orally intubated patient is going to need a tracheostomy for anticipated long-term mechanical ventilation  Medications: Reviewed on Rounds  Physical Exam:  Vitals: Temperature 98.0 pulse 112 respiratory 17 blood pressure 160/74 saturations 100%  Ventilator Settings mode ventilation assist control FiO2 28% tidal volume 400 PEEP 5  . General: Comfortable at this time . Eyes: Grossly normal lids, irises & conjunctiva . ENT: grossly tongue is normal . Neck: no obvious mass . Cardiovascular: S1 S2 normal no gallop . Respiratory: No rhonchi or rales are noted . Abdomen: soft . Skin: no rash seen on limited exam . Musculoskeletal: not rigid . Psychiatric:unable to assess . Neurologic: no seizure no involuntary movements         Lab Data:   Basic Metabolic Panel: Recent Labs  Lab 04/27/18 0124 04/28/18 1052 04/29/18 0453 04/30/18 0520 05/01/18 0534 05/03/18 0548  NA 143 142 142 143  --  145  K 4.5 5.5* 5.2* 5.1 4.7 4.1  CL 118* 113* 111 110  --  111  CO2 19* 22 25 24   --  23  GLUCOSE 80 135* 139* 157*  --  107*  BUN 71* 70* 73* 70*  --  60*  CREATININE 1.81* 1.48* 1.51* 1.44*  --  1.12  CALCIUM 8.2* 8.6* 8.8* 9.2  --  9.3  MG  --  1.9 2.0  --   --  2.0  PHOS  --  4.8* 5.2*  --   --   --     ABG: Recent Labs  Lab 04/27/18 0402 04/27/18 0642 04/27/18 1813  PHART 7.222* 7.221* 7.326*  PCO2ART 55.2* 53.8* 43.2  PO2ART 77.0* 69.0* 104  HCO3 21.9 21.3 21.9  O2SAT 94.3 92.0 98.1    Liver Function Tests: Recent Labs  Lab 04/27/18 0124  04/28/18 1052 04/29/18 0453  AST 17  --   --   ALT 13  --   --   ALKPHOS 38  --   --   BILITOT 0.4  --   --   PROT 5.3*  --   --   ALBUMIN 2.5* 2.3* 2.5*   No results for input(s): LIPASE, AMYLASE in the last 168 hours. No results for input(s): AMMONIA in the last 168 hours.  CBC: Recent Labs  Lab 04/27/18 0124 04/28/18 1332 04/29/18 0453 05/03/18 0548  WBC 9.1 8.9 9.3 13.3*  HGB 9.6* 9.0* 9.5* 10.4*  HCT 31.8* 30.0* 31.1* 34.1*  MCV 92.4 90.4 89.4 91.4  PLT 143* 165 162 150    Cardiac Enzymes: No results for input(s): CKTOTAL, CKMB, CKMBINDEX, TROPONINI in the last 168 hours.  BNP (last 3 results) Recent Labs    04/28/18 1052  BNP 1,239.6*    ProBNP (last 3 results) No results for input(s): PROBNP in the last 8760 hours.  Radiological Exams: Dg Abd 1 View  Result Date: 05/01/2018 CLINICAL DATA:  Abdominal distension EXAM: ABDOMEN - 1 VIEW COMPARISON:  04/26/2018 FINDINGS: Nonobstructive bowel gas pattern. Enteric tube terminates in the distal gastric body. Mild degenerative changes of the lower lumbar spine. IMPRESSION: Enteric tube terminates  in the distal gastric body. Electronically Signed   By: Charline Bills M.D.   On: 05/01/2018 18:21    Assessment/Plan Active Problems:   Acute on chronic respiratory failure with hypoxia (HCC)   Severe sepsis (HCC)   Chronic atrial fibrillation   Non-STEMI (non-ST elevated myocardial infarction) (HCC)   Acute systolic heart failure (HCC)   1. Acute on chronic respiratory failure with hypoxia patient currently is on assist control 28% FiO2 orally intubated.  Patient has high risk airway and I anticipate that he will require prolonged mechanical ventilation we will get consultation for surgery to see the patient for tracheostomy 2. Severe sepsis right now appears to be hemodynamically stable we will continue to monitor. 3. Chronic atrial fibrillation cardiology has been seeing the patient appreciate their  input 4. Non-STEMI unchanged we will continue with supportive care appreciate cardiac input 5. Acute systolic heart failure poor ejection fraction we will continue with supportive care monitor fluid status   I have personally seen and evaluated the patient, evaluated laboratory and imaging results, formulated the assessment and plan and placed orders.  Time 35 minutes patient is critically ill in danger of cardiac arrest and death is orally intubated with a high risk airway The Patient requires high complexity decision making for assessment and support.  Case was discussed on Rounds with the Respiratory Therapy Staff  Yevonne Pax, MD [facility] Pulmonary Critical Care Medicine Sleep Medicine

## 2018-05-04 ENCOUNTER — Other Ambulatory Visit (HOSPITAL_COMMUNITY): Payer: Self-pay

## 2018-05-04 DIAGNOSIS — I482 Chronic atrial fibrillation, unspecified: Secondary | ICD-10-CM | POA: Diagnosis not present

## 2018-05-04 DIAGNOSIS — J9621 Acute and chronic respiratory failure with hypoxia: Secondary | ICD-10-CM | POA: Diagnosis not present

## 2018-05-04 DIAGNOSIS — I214 Non-ST elevation (NSTEMI) myocardial infarction: Secondary | ICD-10-CM | POA: Diagnosis not present

## 2018-05-04 DIAGNOSIS — I5021 Acute systolic (congestive) heart failure: Secondary | ICD-10-CM | POA: Diagnosis not present

## 2018-05-04 NOTE — Progress Notes (Addendum)
Pulmonary Critical Care Medicine [facility] [place]   PULMONARY CRITICAL CARE SERVICE  PROGRESS NOTE  Date of Service: 05/04/2018  Breeze Schackmann  HVF:[id number]  DOB: [birth year]   DOA: 04/26/2018  Referring Physician: Carron Curie, MD  HPI: Hovanes Fanta is a 73 y.o. male seen for follow up of Acute on Chronic Respiratory Failure.  Patient remains orally intubated on fentanyl and propofol.  Unable to wean at this time.  Most likely will need tracheostomy and ENT consult is placed.  Current FiO2 requirement 28%.  Medications: Reviewed on Rounds  Physical Exam:  Vitals: Pulse 91 respirations 24 BP 129/81 O2 sat 100% temp 99.4  Ventilator Settings ventilator mode AC VC rate of 24 tidal volume 440 PEEP of 5 FiO2 28%  . General: Comfortable at this time . Eyes: Grossly normal lids, irises & conjunctiva . ENT: grossly tongue is normal . Neck: no obvious mass . Cardiovascular: S1 S2 normal no gallop . Respiratory: No rales or rhonchi noted . Abdomen: soft . Skin: no rash seen on limited exam . Musculoskeletal: not rigid . Psychiatric:unable to assess . Neurologic: no seizure no involuntary movements         Lab Data:   Basic Metabolic Panel: Recent Labs  Lab 04/28/18 1052 04/29/18 0453 04/30/18 0520 05/01/18 0534 05/03/18 0548  NA 142 142 143  --  145  K 5.5* 5.2* 5.1 4.7 4.1  CL 113* 111 110  --  111  CO2 22 25 24   --  23  GLUCOSE 135* 139* 157*  --  107*  BUN 70* 73* 70*  --  60*  CREATININE 1.48* 1.51* 1.44*  --  1.12  CALCIUM 8.6* 8.8* 9.2  --  9.3  MG 1.9 2.0  --   --  2.0  PHOS 4.8* 5.2*  --   --   --     ABG: Recent Labs  Lab 04/27/18 1813  PHART 7.326*  PCO2ART 43.2  PO2ART 104  HCO3 21.9  O2SAT 98.1    Liver Function Tests: Recent Labs  Lab 04/28/18 1052 04/29/18 0453  ALBUMIN 2.3* 2.5*   No results for input(s): LIPASE, AMYLASE in the last 168 hours. No results for input(s): AMMONIA in the last 168 hours.  CBC: Recent Labs   Lab 04/28/18 1332 04/29/18 0453 05/03/18 0548  WBC 8.9 9.3 13.3*  HGB 9.0* 9.5* 10.4*  HCT 30.0* 31.1* 34.1*  MCV 90.4 89.4 91.4  PLT 165 162 150    Cardiac Enzymes: No results for input(s): CKTOTAL, CKMB, CKMBINDEX, TROPONINI in the last 168 hours.  BNP (last 3 results) Recent Labs    04/28/18 1052  BNP 1,239.6*    ProBNP (last 3 results) No results for input(s): PROBNP in the last 8760 hours.  Radiological Exams: Dg Chest Port 1 View  Result Date: 05/04/2018 CLINICAL DATA:  Follow-up CHF EXAM: PORTABLE CHEST 1 VIEW COMPARISON:  04/28/2010 FINDINGS: An endotracheal tube with tip 6 cm above the carina and NG tube entering the stomach with tip off the field of view again noted. Interstitial edema has resolved Improved bibasilar aeration with continued small effusions and LEFT basilar atelectasis. IMPRESSION: Resolved interstitial pulmonary edema and improved bibasilar aeration. Small bilateral pleural effusions and LEFT LOWER lobe atelectasis persists. Electronically Signed   By: Harmon Pier M.D.   On: 05/04/2018 08:33    Assessment/Plan Active Problems:   Acute on chronic respiratory failure with hypoxia (HCC)   Severe sepsis (HCC)   Chronic atrial fibrillation  Non-STEMI (non-ST elevated myocardial infarction) (HCC)   Acute systolic heart failure (HCC)   1. Acute on chronic respiratory failure with hypoxia patient currently on assist control 28% FiO2 orally intubated.  Patient had prolonged mechanical ventilation and a consult for ENT for tracheostomy was placed. 2. Severe sepsis right now appears to be hemodynamically stable continue to monitor 3. Chronic atrial fibrillation cardiology following. 4. Non-STEMI unchanged continue supportive care 5. Acute systolic heart failure poor ejection fraction continue supportive care monitor fluids   I have personally seen and evaluated the patient, evaluated laboratory and imaging results, formulated the assessment and plan  and placed orders. The Patient requires high complexity decision making for assessment and support.  Case was discussed on Rounds with the Respiratory Therapy Staff  Yevonne Pax, MD [facility] Pulmonary Critical Care Medicine Sleep Medicine

## 2018-05-05 DIAGNOSIS — I5021 Acute systolic (congestive) heart failure: Secondary | ICD-10-CM | POA: Diagnosis not present

## 2018-05-05 DIAGNOSIS — I214 Non-ST elevation (NSTEMI) myocardial infarction: Secondary | ICD-10-CM | POA: Diagnosis not present

## 2018-05-05 DIAGNOSIS — I482 Chronic atrial fibrillation, unspecified: Secondary | ICD-10-CM | POA: Diagnosis not present

## 2018-05-05 DIAGNOSIS — J9621 Acute and chronic respiratory failure with hypoxia: Secondary | ICD-10-CM | POA: Diagnosis not present

## 2018-05-05 LAB — URINALYSIS, ROUTINE W REFLEX MICROSCOPIC
BILIRUBIN URINE: NEGATIVE
Glucose, UA: 50 mg/dL — AB
Ketones, ur: NEGATIVE mg/dL
LEUKOCYTE UA: NEGATIVE
NITRITE: NEGATIVE
Protein, ur: NEGATIVE mg/dL
SPECIFIC GRAVITY, URINE: 1.016 (ref 1.005–1.030)
pH: 5 (ref 5.0–8.0)

## 2018-05-05 LAB — CBC
HCT: 28 % — ABNORMAL LOW (ref 39.0–52.0)
Hemoglobin: 8.8 g/dL — ABNORMAL LOW (ref 13.0–17.0)
MCH: 28.4 pg (ref 26.0–34.0)
MCHC: 31.4 g/dL (ref 30.0–36.0)
MCV: 90.3 fL (ref 80.0–100.0)
Platelets: 116 10*3/uL — ABNORMAL LOW (ref 150–400)
RBC: 3.1 MIL/uL — ABNORMAL LOW (ref 4.22–5.81)
RDW: 15.9 % — ABNORMAL HIGH (ref 11.5–15.5)
WBC: 13.9 10*3/uL — ABNORMAL HIGH (ref 4.0–10.5)
nRBC: 0 % (ref 0.0–0.2)

## 2018-05-05 LAB — BASIC METABOLIC PANEL
Anion gap: 7 (ref 5–15)
BUN: 44 mg/dL — ABNORMAL HIGH (ref 8–23)
CO2: 22 mmol/L (ref 22–32)
Calcium: 8.6 mg/dL — ABNORMAL LOW (ref 8.9–10.3)
Chloride: 112 mmol/L — ABNORMAL HIGH (ref 98–111)
Creatinine, Ser: 0.95 mg/dL (ref 0.61–1.24)
GFR calc Af Amer: >60 mL/min (ref >60)
GFR calc non Af Amer: >60 mL/min (ref >60)
Glucose, Bld: 120 mg/dL — ABNORMAL HIGH (ref 70–99)
Potassium: 3.5 mmol/L (ref 3.5–5.1)
Sodium: 141 mmol/L (ref 135–145)

## 2018-05-05 LAB — MAGNESIUM: MAGNESIUM: 1.6 mg/dL — AB (ref 1.7–2.4)

## 2018-05-05 NOTE — Progress Notes (Addendum)
Pulmonary Critical Care Medicine [facility] [place]   PULMONARY CRITICAL CARE SERVICE  PROGRESS NOTE  Date of Service: 05/05/2018  Richard Wang  RUE:[id number]  DOB: 1946-02-05   DOA: 04/26/2018  Referring Physician: Carron Curie, MD  HPI: Richard Wang is a 73 y.o. male seen for follow up of Acute on Chronic Respiratory Failure.  Patient has a goal today of 8 hours on pressure support.  Continues with fentanyl and propofol drips in place.  Current FiO2 requirement is 28%.  Medications: Reviewed on Rounds  Physical Exam:  Vitals: Pulse 79 respirations 24 BP 141/74 O2 sat 100% temp 98.7  Ventilator Settings ventilator mode AC VC rate of 24 tidal volume 440 PEEP of 5 FiO2 28%  . General: Comfortable at this time . Eyes: Grossly normal lids, irises & conjunctiva . ENT: grossly tongue is normal . Neck: no obvious mass . Cardiovascular: S1 S2 normal no gallop . Respiratory: No rales or rhonchi noted . Abdomen: soft . Skin: no rash seen on limited exam . Musculoskeletal: not rigid . Psychiatric:unable to assess . Neurologic: no seizure no involuntary movements         Lab Data:   Basic Metabolic Panel: Recent Labs  Lab 04/29/18 0453 04/30/18 0520 05/01/18 0534 05/03/18 0548 05/05/18 0818  NA 142 143  --  145 141  K 5.2* 5.1 4.7 4.1 3.5  CL 111 110  --  111 112*  CO2 25 24  --  23 22  GLUCOSE 139* 157*  --  107* 120*  BUN 73* 70*  --  60* 44*  CREATININE 1.51* 1.44*  --  1.12 0.95  CALCIUM 8.8* 9.2  --  9.3 8.6*  MG 2.0  --   --  2.0 1.6*  PHOS 5.2*  --   --   --   --     ABG: No results for input(s): PHART, PCO2ART, PO2ART, HCO3, O2SAT in the last 168 hours.  Liver Function Tests: Recent Labs  Lab 04/29/18 0453  ALBUMIN 2.5*   No results for input(s): LIPASE, AMYLASE in the last 168 hours. No results for input(s): AMMONIA in the last 168 hours.  CBC: Recent Labs  Lab 04/29/18 0453 05/03/18 0548 05/05/18 0818  WBC 9.3 13.3* 13.9*  HGB 9.5*  10.4* 8.8*  HCT 31.1* 34.1* 28.0*  MCV 89.4 91.4 90.3  PLT 162 150 116*    Cardiac Enzymes: No results for input(s): CKTOTAL, CKMB, CKMBINDEX, TROPONINI in the last 168 hours.  BNP (last 3 results) Recent Labs    04/28/18 1052  BNP 1,239.6*    ProBNP (last 3 results) No results for input(s): PROBNP in the last 8760 hours.  Radiological Exams: Dg Chest Port 1 View  Result Date: 05/04/2018 CLINICAL DATA:  Follow-up CHF EXAM: PORTABLE CHEST 1 VIEW COMPARISON:  04/28/2010 FINDINGS: An endotracheal tube with tip 6 cm above the carina and NG tube entering the stomach with tip off the field of view again noted. Interstitial edema has resolved Improved bibasilar aeration with continued small effusions and LEFT basilar atelectasis. IMPRESSION: Resolved interstitial pulmonary edema and improved bibasilar aeration. Small bilateral pleural effusions and LEFT LOWER lobe atelectasis persists. Electronically Signed   By: Harmon Pier M.D.   On: 05/04/2018 08:33    Assessment/Plan Active Problems:   Acute on chronic respiratory failure with hypoxia (HCC)   Severe sepsis (HCC)   Chronic atrial fibrillation   Non-STEMI (non-ST elevated myocardial infarction) (HCC)   Acute systolic heart failure (HCC)  1. Acute on chronic respiratory failure with hypoxia patient currently on assist control 20% FiO2 orally intubated.  Awaiting ENT consultation for tracheostomy. 2. Severe sepsis right now appears to be dynamically stable continue to monitor 3. Chronic atrial fibrillation cardiology following 4. NSTEMI unchanged continue supportive care 5. Acute systolic heart failure poor ejection fraction continue supportive care monitor fluid intake.   I have personally seen and evaluated the patient, evaluated laboratory and imaging results, formulated the assessment and plan and placed orders. The Patient requires high complexity decision making for assessment and support.  Case was discussed on Rounds with  the Respiratory Therapy Staff  Yevonne Pax, MD [facility] Pulmonary Critical Care Medicine Sleep Medicine

## 2018-05-06 ENCOUNTER — Other Ambulatory Visit (HOSPITAL_COMMUNITY): Payer: Self-pay

## 2018-05-06 DIAGNOSIS — J9621 Acute and chronic respiratory failure with hypoxia: Secondary | ICD-10-CM | POA: Diagnosis not present

## 2018-05-06 DIAGNOSIS — I5021 Acute systolic (congestive) heart failure: Secondary | ICD-10-CM | POA: Diagnosis not present

## 2018-05-06 DIAGNOSIS — I214 Non-ST elevation (NSTEMI) myocardial infarction: Secondary | ICD-10-CM | POA: Diagnosis not present

## 2018-05-06 DIAGNOSIS — I482 Chronic atrial fibrillation, unspecified: Secondary | ICD-10-CM | POA: Diagnosis not present

## 2018-05-06 LAB — CBC
HCT: 27.9 % — ABNORMAL LOW (ref 39.0–52.0)
Hemoglobin: 8.6 g/dL — ABNORMAL LOW (ref 13.0–17.0)
MCH: 27.8 pg (ref 26.0–34.0)
MCHC: 30.8 g/dL (ref 30.0–36.0)
MCV: 90.3 fL (ref 80.0–100.0)
Platelets: 127 10*3/uL — ABNORMAL LOW (ref 150–400)
RBC: 3.09 MIL/uL — ABNORMAL LOW (ref 4.22–5.81)
RDW: 16.4 % — ABNORMAL HIGH (ref 11.5–15.5)
WBC: 14.6 10*3/uL — AB (ref 4.0–10.5)
nRBC: 0 % (ref 0.0–0.2)

## 2018-05-06 LAB — RENAL FUNCTION PANEL
ALBUMIN: 2.8 g/dL — AB (ref 3.5–5.0)
Anion gap: 9 (ref 5–15)
BUN: 45 mg/dL — ABNORMAL HIGH (ref 8–23)
CALCIUM: 8.9 mg/dL (ref 8.9–10.3)
CO2: 21 mmol/L — ABNORMAL LOW (ref 22–32)
Chloride: 111 mmol/L (ref 98–111)
Creatinine, Ser: 1.02 mg/dL (ref 0.61–1.24)
GFR calc Af Amer: >60 mL/min (ref >60)
GFR calc non Af Amer: >60 mL/min (ref >60)
Glucose, Bld: 176 mg/dL — ABNORMAL HIGH (ref 70–99)
PHOSPHORUS: 4 mg/dL (ref 2.5–4.6)
Potassium: 3.8 mmol/L (ref 3.5–5.1)
Sodium: 141 mmol/L (ref 135–145)

## 2018-05-06 LAB — URINE CULTURE: Culture: 30000 — AB

## 2018-05-06 LAB — MAGNESIUM: Magnesium: 2 mg/dL (ref 1.7–2.4)

## 2018-05-06 NOTE — Progress Notes (Addendum)
Pulmonary Critical Care Medicine [facility] [place]   PULMONARY CRITICAL CARE SERVICE  PROGRESS NOTE  Date of Service: 05/06/2018  Richard Wang  [id number]:[id number]  DOB: October 01, 1945   DOA: 04/26/2018  Referring Physician: Carron Curie, MD  HPI: Richard Wang is a 73 y.o. male seen for follow up of Acute on Chronic Respiratory Failure.  Patient remains orally intubated he is more awake now the propofol has been weaned patient was attempted on pressure support and did not exactly tolerate it very well.  Respiratory therapy will reassess and try to resume the wean once again  Medications: Reviewed on Rounds  Physical Exam:  Vitals: Temperature 98.6 pulse 70 respiratory 29 blood pressure 146/66 saturations 100%  Ventilator Settings mode ventilation assist control FiO2 is 28% tidal volume 448 PEEP 5  . General: Comfortable at this time . Eyes: Grossly normal lids, irises & conjunctiva . ENT: grossly tongue is normal . Neck: no obvious mass . Cardiovascular: S1 S2 normal no gallop . Respiratory: Scattered rhonchi expansion is equal . Abdomen: soft . Skin: no rash seen on limited exam . Musculoskeletal: not rigid . Psychiatric:unable to assess . Neurologic: no seizure no involuntary movements         Lab Data:   Basic Metabolic Panel: Recent Labs  Lab 04/30/18 0520 05/01/18 0534 05/03/18 0548 05/05/18 0818 05/06/18 0659  NA 143  --  145 141  --   K 5.1 4.7 4.1 3.5  --   CL 110  --  111 112*  --   CO2 24  --  23 22  --   GLUCOSE 157*  --  107* 120*  --   BUN 70*  --  60* 44*  --   CREATININE 1.44*  --  1.12 0.95  --   CALCIUM 9.2  --  9.3 8.6*  --   MG  --   --  2.0 1.6* 2.0    ABG: No results for input(s): PHART, PCO2ART, PO2ART, HCO3, O2SAT in the last 168 hours.  Liver Function Tests: No results for input(s): AST, ALT, ALKPHOS, BILITOT, PROT, ALBUMIN in the last 168 hours. No results for input(s): LIPASE, AMYLASE in the last 168 hours. No results for  input(s): AMMONIA in the last 168 hours.  CBC: Recent Labs  Lab 05/03/18 0548 05/05/18 0818 05/06/18 0659  WBC 13.3* 13.9* 14.6*  HGB 10.4* 8.8* 8.6*  HCT 34.1* 28.0* 27.9*  MCV 91.4 90.3 90.3  PLT 150 116* 127*    Cardiac Enzymes: No results for input(s): CKTOTAL, CKMB, CKMBINDEX, TROPONINI in the last 168 hours.  BNP (last 3 results) Recent Labs    04/28/18 1052  BNP 1,239.6*    ProBNP (last 3 results) No results for input(s): PROBNP in the last 8760 hours.  Radiological Exams: Dg Chest Port 1 View  Result Date: 05/06/2018 CLINICAL DATA:  Endotracheal tube position EXAM: PORTABLE CHEST 1 VIEW COMPARISON:  Two days ago FINDINGS: Endotracheal tube tip just below the clavicular heads. The orogastric tube reaches the stomach. Normal heart size for technique. Unchanged haziness of the bilateral chest. No edema, effusion, or pneumothorax. IMPRESSION: 1. Unremarkable hardware positioning. 2. Stable aeration. Electronically Signed   By: Marnee Spring M.D.   On: 05/06/2018 05:09    Assessment/Plan Active Problems:   Acute on chronic respiratory failure with hypoxia (HCC)   Severe sepsis (HCC)   Chronic atrial fibrillation   Non-STEMI (non-ST elevated myocardial infarction) (HCC)   Acute systolic heart failure (HCC)   1.  Acute on chronic respiratory failure with hypoxia patient currently is on assist control mode patient is orally intubated we will continue with full support on mechanical ventilation.  Respiratory therapy is going to recheck the RSB I and see if he is able to wean but I think unfortunately patient will more than likely end up needing to have a tracheostomy. 2. Severe sepsis hemodynamically right now stable we will continue to monitor hemodynamics 3. Chronic atrial fibrillation rate controlled at this time 4. Non-STEMI at baseline continue supportive care 5. Acute systolic heart failure last chest x-ray reviewed stable aeration we will continue with supportive  care   I have personally seen and evaluated the patient, evaluated laboratory and imaging results, formulated the assessment and plan and placed orders.  Patient is critically ill in danger of cardiac arrest and death has a high risk airway orally intubated time is 35 minutes The Patient requires high complexity decision making for assessment and support.  Case was discussed on Rounds with the Respiratory Therapy Staff  Yevonne Pax, MD [facility] Pulmonary Critical Care Medicine Sleep Medicine

## 2018-05-07 ENCOUNTER — Other Ambulatory Visit (HOSPITAL_COMMUNITY): Payer: Self-pay

## 2018-05-07 DIAGNOSIS — J9621 Acute and chronic respiratory failure with hypoxia: Secondary | ICD-10-CM | POA: Diagnosis not present

## 2018-05-07 DIAGNOSIS — I482 Chronic atrial fibrillation, unspecified: Secondary | ICD-10-CM | POA: Diagnosis not present

## 2018-05-07 DIAGNOSIS — I5021 Acute systolic (congestive) heart failure: Secondary | ICD-10-CM | POA: Diagnosis not present

## 2018-05-07 DIAGNOSIS — I214 Non-ST elevation (NSTEMI) myocardial infarction: Secondary | ICD-10-CM | POA: Diagnosis not present

## 2018-05-07 LAB — RENAL FUNCTION PANEL
Albumin: 2.7 g/dL — ABNORMAL LOW (ref 3.5–5.0)
Anion gap: 7 (ref 5–15)
BUN: 47 mg/dL — ABNORMAL HIGH (ref 8–23)
CO2: 24 mmol/L (ref 22–32)
Calcium: 9.2 mg/dL (ref 8.9–10.3)
Chloride: 113 mmol/L — ABNORMAL HIGH (ref 98–111)
Creatinine, Ser: 1.15 mg/dL (ref 0.61–1.24)
GFR calc non Af Amer: >60 mL/min (ref >60)
Glucose, Bld: 117 mg/dL — ABNORMAL HIGH (ref 70–99)
PHOSPHORUS: 4 mg/dL (ref 2.5–4.6)
Potassium: 3.4 mmol/L — ABNORMAL LOW (ref 3.5–5.1)
Sodium: 144 mmol/L (ref 135–145)

## 2018-05-07 LAB — CBC
HEMATOCRIT: 23.5 % — AB (ref 39.0–52.0)
Hemoglobin: 7.3 g/dL — ABNORMAL LOW (ref 13.0–17.0)
MCH: 28.2 pg (ref 26.0–34.0)
MCHC: 31.1 g/dL (ref 30.0–36.0)
MCV: 90.7 fL (ref 80.0–100.0)
Platelets: 127 10*3/uL — ABNORMAL LOW (ref 150–400)
RBC: 2.59 MIL/uL — ABNORMAL LOW (ref 4.22–5.81)
RDW: 17 % — ABNORMAL HIGH (ref 11.5–15.5)
WBC: 16.2 10*3/uL — ABNORMAL HIGH (ref 4.0–10.5)
nRBC: 0 % (ref 0.0–0.2)

## 2018-05-07 LAB — MAGNESIUM: Magnesium: 1.8 mg/dL (ref 1.7–2.4)

## 2018-05-07 NOTE — Progress Notes (Addendum)
Pulmonary Critical Care Medicine [facility] [place]   PULMONARY CRITICAL CARE SERVICE  PROGRESS NOTE  Date of Service: 05/07/2018  Richard Wang  YJE:[id number]  DOB: 1945-12-21   DOA: 04/26/2018  Referring Physician: Carron Curie, MD  HPI: Richard Wang is a 73 y.o. male seen for follow up of Acute on Chronic Respiratory Failure.  Patient remains orally intubated has been on pressure support mode to attempt to wean.  Right now is on 28% oxygen with a pressure support 12/5  Medications: Reviewed on Rounds  Physical Exam:  Vitals: Temperature 98.2 pulse 64 respiratory 16 blood pressure 153/77 saturations 100%  Ventilator Settings mode ventilation pressure support FiO2 28% tidal volume 613 pressure 12 PEEP 5  . General: Comfortable at this time . Eyes: Grossly normal lids, irises & conjunctiva . ENT: grossly tongue is normal . Neck: no obvious mass . Cardiovascular: S1 S2 normal no gallop . Respiratory: No rhonchi or rales are noted at this time . Abdomen: soft . Skin: no rash seen on limited exam . Musculoskeletal: not rigid . Psychiatric:unable to assess . Neurologic: no seizure no involuntary movements         Lab Data:   Basic Metabolic Panel: Recent Labs  Lab 05/01/18 0534 05/03/18 0548 05/05/18 0818 05/06/18 0659 05/06/18 1405 05/07/18 0453  NA  --  145 141  --  141 144  K 4.7 4.1 3.5  --  3.8 3.4*  CL  --  111 112*  --  111 113*  CO2  --  23 22  --  21* 24  GLUCOSE  --  107* 120*  --  176* 117*  BUN  --  60* 44*  --  45* 47*  CREATININE  --  1.12 0.95  --  1.02 1.15  CALCIUM  --  9.3 8.6*  --  8.9 9.2  MG  --  2.0 1.6* 2.0  --  1.8  PHOS  --   --   --   --  4.0 4.0    ABG: No results for input(s): PHART, PCO2ART, PO2ART, HCO3, O2SAT in the last 168 hours.  Liver Function Tests: Recent Labs  Lab 05/06/18 1405 05/07/18 0453  ALBUMIN 2.8* 2.7*   No results for input(s): LIPASE, AMYLASE in the last 168 hours. No results for input(s):  AMMONIA in the last 168 hours.  CBC: Recent Labs  Lab 05/03/18 0548 05/05/18 0818 05/06/18 0659 05/07/18 0453  WBC 13.3* 13.9* 14.6* 16.2*  HGB 10.4* 8.8* 8.6* 7.3*  HCT 34.1* 28.0* 27.9* 23.5*  MCV 91.4 90.3 90.3 90.7  PLT 150 116* 127* 127*    Cardiac Enzymes: No results for input(s): CKTOTAL, CKMB, CKMBINDEX, TROPONINI in the last 168 hours.  BNP (last 3 results) Recent Labs    04/28/18 1052  BNP 1,239.6*    ProBNP (last 3 results) No results for input(s): PROBNP in the last 8760 hours.  Radiological Exams: Dg Chest Port 1 View  Result Date: 05/07/2018 CLINICAL DATA:  Hypoxia EXAM: PORTABLE CHEST 1 VIEW COMPARISON:  May 06, 2018 FINDINGS: Endotracheal tube tip is 4.0 cm above the carina. Nasogastric tube tip and side port are below the diaphragm. No pneumothorax. There is a calcified granuloma in the left lower lobe. There is no edema or consolidation. Heart is upper normal in size with pulmonary vascularity normal. No adenopathy. There is aortic atherosclerosis. No bone lesions. IMPRESSION: Tube positions as described without pneumothorax. No edema or consolidation. Stable cardiac silhouette. Small granuloma left lower lobe.  Aortic Atherosclerosis (ICD10-I70.0). Electronically Signed   By: Bretta Bang III M.D.   On: 05/07/2018 07:49   Dg Chest Port 1 View  Result Date: 05/06/2018 CLINICAL DATA:  Endotracheal tube position EXAM: PORTABLE CHEST 1 VIEW COMPARISON:  Two days ago FINDINGS: Endotracheal tube tip just below the clavicular heads. The orogastric tube reaches the stomach. Normal heart size for technique. Unchanged haziness of the bilateral chest. No edema, effusion, or pneumothorax. IMPRESSION: 1. Unremarkable hardware positioning. 2. Stable aeration. Electronically Signed   By: Marnee Spring M.D.   On: 05/06/2018 05:09    Assessment/Plan Active Problems:   Acute on chronic respiratory failure with hypoxia (HCC)   Severe sepsis (HCC)   Chronic atrial  fibrillation   Non-STEMI (non-ST elevated myocardial infarction) (HCC)   Acute systolic heart failure (HCC)   1. Acute on chronic respiratory failure hypoxia continue with pressure support titrate oxygen continue pulmonary toilet 2. Severe sepsis hemodynamically stable 3. Chronic atrial fibrillation rate is controlled 4. Non-STEMI at baseline continue present management 5. Acute systolic heart failure compensated we will continue to monitor closely diuretics as tolerated   I have personally seen and evaluated the patient, evaluated laboratory and imaging results, formulated the assessment and plan and placed orders. The Patient requires high complexity decision making for assessment and support.  Case was discussed on Rounds with the Respiratory Therapy Staff  Yevonne Pax, MD [facility] Pulmonary Critical Care Medicine Sleep Medicine

## 2018-05-08 DIAGNOSIS — I214 Non-ST elevation (NSTEMI) myocardial infarction: Secondary | ICD-10-CM | POA: Diagnosis not present

## 2018-05-08 DIAGNOSIS — I5021 Acute systolic (congestive) heart failure: Secondary | ICD-10-CM | POA: Diagnosis not present

## 2018-05-08 DIAGNOSIS — J9621 Acute and chronic respiratory failure with hypoxia: Secondary | ICD-10-CM | POA: Diagnosis not present

## 2018-05-08 DIAGNOSIS — I482 Chronic atrial fibrillation, unspecified: Secondary | ICD-10-CM | POA: Diagnosis not present

## 2018-05-08 LAB — CBC
HEMATOCRIT: 23.5 % — AB (ref 39.0–52.0)
HEMOGLOBIN: 7 g/dL — AB (ref 13.0–17.0)
MCH: 27.6 pg (ref 26.0–34.0)
MCHC: 29.8 g/dL — ABNORMAL LOW (ref 30.0–36.0)
MCV: 92.5 fL (ref 80.0–100.0)
Platelets: 128 10*3/uL — ABNORMAL LOW (ref 150–400)
RBC: 2.54 MIL/uL — ABNORMAL LOW (ref 4.22–5.81)
RDW: 17.4 % — ABNORMAL HIGH (ref 11.5–15.5)
WBC: 13.2 10*3/uL — ABNORMAL HIGH (ref 4.0–10.5)
nRBC: 0 % (ref 0.0–0.2)

## 2018-05-08 LAB — BASIC METABOLIC PANEL
Anion gap: 8 (ref 5–15)
BUN: 53 mg/dL — ABNORMAL HIGH (ref 8–23)
CO2: 26 mmol/L (ref 22–32)
Calcium: 9.4 mg/dL (ref 8.9–10.3)
Chloride: 113 mmol/L — ABNORMAL HIGH (ref 98–111)
Creatinine, Ser: 1.15 mg/dL (ref 0.61–1.24)
GFR calc Af Amer: >60 mL/min (ref >60)
GFR calc non Af Amer: >60 mL/min (ref >60)
GLUCOSE: 127 mg/dL — AB (ref 70–99)
Potassium: 3.6 mmol/L (ref 3.5–5.1)
Sodium: 147 mmol/L — ABNORMAL HIGH (ref 135–145)

## 2018-05-08 LAB — ABO/RH: ABO/RH(D): O POS

## 2018-05-08 LAB — MAGNESIUM: Magnesium: 1.9 mg/dL (ref 1.7–2.4)

## 2018-05-08 LAB — PREPARE RBC (CROSSMATCH)

## 2018-05-08 LAB — PHOSPHORUS: Phosphorus: 4.4 mg/dL (ref 2.5–4.6)

## 2018-05-08 NOTE — Progress Notes (Addendum)
Pulmonary Critical Care Medicine [facility] [place]   PULMONARY CRITICAL CARE SERVICE  PROGRESS NOTE  Date of Service: 05/08/2018  Richard Wang  [id number]:[id number]  DOB: 12/31/1945   DOA: 04/26/2018  Referring Physician: Carron Curie, MD  HPI: Richard Wang is a 73 y.o. male seen for follow up of Acute on Chronic Respiratory Failure.  Patient has a goal on pressure support today of 16 hours.  He received a dose of Versed at some point and became apneic.  He also had hemoglobin today of 7 and was given packed red blood cells.  Medications: Reviewed on Rounds  Physical Exam:  Vitals: Pulse 68 respirations 24 BP 145/77 O2 sat 100% temp 98.7  Ventilator Settings ventilator mode pressure support 12/5 FiO2 28%  . General: Comfortable at this time . Eyes: Grossly normal lids, irises & conjunctiva . ENT: grossly tongue is normal . Neck: no obvious mass . Cardiovascular: S1 S2 normal no gallop . Respiratory: No rales or rhonchi noted . Abdomen: soft . Skin: no rash seen on limited exam . Musculoskeletal: not rigid . Psychiatric:unable to assess . Neurologic: no seizure no involuntary movements         Lab Data:   Basic Metabolic Panel: Recent Labs  Lab 05/03/18 0548 05/05/18 0818 05/06/18 0659 05/06/18 1405 05/07/18 0453 05/08/18 0431  NA 145 141  --  141 144 147*  K 4.1 3.5  --  3.8 3.4* 3.6  CL 111 112*  --  111 113* 113*  CO2 23 22  --  21* 24 26  GLUCOSE 107* 120*  --  176* 117* 127*  BUN 60* 44*  --  45* 47* 53*  CREATININE 1.12 0.95  --  1.02 1.15 1.15  CALCIUM 9.3 8.6*  --  8.9 9.2 9.4  MG 2.0 1.6* 2.0  --  1.8 1.9  PHOS  --   --   --  4.0 4.0 4.4    ABG: No results for input(s): PHART, PCO2ART, PO2ART, HCO3, O2SAT in the last 168 hours.  Liver Function Tests: Recent Labs  Lab 05/06/18 1405 05/07/18 0453  ALBUMIN 2.8* 2.7*   No results for input(s): LIPASE, AMYLASE in the last 168 hours. No results for input(s): AMMONIA in the last 168  hours.  CBC: Recent Labs  Lab 05/03/18 0548 05/05/18 0818 05/06/18 0659 05/07/18 0453 05/08/18 0431  WBC 13.3* 13.9* 14.6* 16.2* 13.2*  HGB 10.4* 8.8* 8.6* 7.3* 7.0*  HCT 34.1* 28.0* 27.9* 23.5* 23.5*  MCV 91.4 90.3 90.3 90.7 92.5  PLT 150 116* 127* 127* 128*    Cardiac Enzymes: No results for input(s): CKTOTAL, CKMB, CKMBINDEX, TROPONINI in the last 168 hours.  BNP (last 3 results) Recent Labs    04/28/18 1052  BNP 1,239.6*    ProBNP (last 3 results) No results for input(s): PROBNP in the last 8760 hours.  Radiological Exams: Dg Chest Port 1 View  Result Date: 05/07/2018 CLINICAL DATA:  Hypoxia EXAM: PORTABLE CHEST 1 VIEW COMPARISON:  May 06, 2018 FINDINGS: Endotracheal tube tip is 4.0 cm above the carina. Nasogastric tube tip and side port are below the diaphragm. No pneumothorax. There is a calcified granuloma in the left lower lobe. There is no edema or consolidation. Heart is upper normal in size with pulmonary vascularity normal. No adenopathy. There is aortic atherosclerosis. No bone lesions. IMPRESSION: Tube positions as described without pneumothorax. No edema or consolidation. Stable cardiac silhouette. Small granuloma left lower lobe. Aortic Atherosclerosis (ICD10-I70.0). Electronically Signed   By:  Bretta Bang III M.D.   On: 05/07/2018 07:49    Assessment/Plan Active Problems:   Acute on chronic respiratory failure with hypoxia (HCC)   Severe sepsis (HCC)   Chronic atrial fibrillation   Non-STEMI (non-ST elevated myocardial infarction) (HCC)   Acute systolic heart failure (HCC)   1. Acute on chronic respiratory failure with hypoxia continue with pressure support and titrate oxygen.  Continue aggressive pulmonary toilet and secretion management. 2. Severe sepsis hemodynamically stable 3. Chronic atrial fibrillation rate controlled 4. Non-STEMI at baseline continue present management 5. Acute systolic heart failure compensated continue to monitor  closely using diuretics as tolerated.   I have personally seen and evaluated the patient, evaluated laboratory and imaging results, formulated the assessment and plan and placed orders. The Patient requires high complexity decision making for assessment and support.  Case was discussed on Rounds with the Respiratory Therapy Staff  Yevonne Pax, MD [facility] Pulmonary Critical Care Medicine Sleep Medicine

## 2018-05-09 DIAGNOSIS — I214 Non-ST elevation (NSTEMI) myocardial infarction: Secondary | ICD-10-CM | POA: Diagnosis not present

## 2018-05-09 DIAGNOSIS — I482 Chronic atrial fibrillation, unspecified: Secondary | ICD-10-CM | POA: Diagnosis not present

## 2018-05-09 DIAGNOSIS — I5021 Acute systolic (congestive) heart failure: Secondary | ICD-10-CM | POA: Diagnosis not present

## 2018-05-09 DIAGNOSIS — J9621 Acute and chronic respiratory failure with hypoxia: Secondary | ICD-10-CM | POA: Diagnosis not present

## 2018-05-09 LAB — TYPE AND SCREEN
ABO/RH(D): O POS
Antibody Screen: NEGATIVE
Unit division: 0

## 2018-05-09 LAB — BPAM RBC
Blood Product Expiration Date: 202004102359
ISSUE DATE / TIME: 202003181533
Unit Type and Rh: 5100

## 2018-05-09 LAB — CBC
HCT: 25.5 % — ABNORMAL LOW (ref 39.0–52.0)
Hemoglobin: 8.1 g/dL — ABNORMAL LOW (ref 13.0–17.0)
MCH: 28 pg (ref 26.0–34.0)
MCHC: 31.8 g/dL (ref 30.0–36.0)
MCV: 88.2 fL (ref 80.0–100.0)
PLATELETS: 122 10*3/uL — AB (ref 150–400)
RBC: 2.89 MIL/uL — ABNORMAL LOW (ref 4.22–5.81)
RDW: 17.2 % — ABNORMAL HIGH (ref 11.5–15.5)
WBC: 15.4 10*3/uL — ABNORMAL HIGH (ref 4.0–10.5)
nRBC: 0 % (ref 0.0–0.2)

## 2018-05-09 LAB — BASIC METABOLIC PANEL
Anion gap: 11 (ref 5–15)
BUN: 50 mg/dL — ABNORMAL HIGH (ref 8–23)
CALCIUM: 9.4 mg/dL (ref 8.9–10.3)
CO2: 22 mmol/L (ref 22–32)
Chloride: 111 mmol/L (ref 98–111)
Creatinine, Ser: 1.12 mg/dL (ref 0.61–1.24)
GFR calc Af Amer: >60 mL/min (ref >60)
GFR calc non Af Amer: >60 mL/min (ref >60)
Glucose, Bld: 102 mg/dL — ABNORMAL HIGH (ref 70–99)
Potassium: 3.7 mmol/L (ref 3.5–5.1)
SODIUM: 144 mmol/L (ref 135–145)

## 2018-05-09 LAB — CULTURE, RESPIRATORY W GRAM STAIN

## 2018-05-09 LAB — MAGNESIUM: Magnesium: 1.7 mg/dL (ref 1.7–2.4)

## 2018-05-09 LAB — OCCULT BLOOD X 1 CARD TO LAB, STOOL: Fecal Occult Bld: POSITIVE — AB

## 2018-05-09 NOTE — Progress Notes (Addendum)
Pulmonary Critical Care Medicine [facility] [place]   PULMONARY CRITICAL CARE SERVICE  PROGRESS NOTE  Date of Service: 05/09/2018  Richard Wang  [id number]  DOB: 1945/09/10   DOA: 04/26/2018  Referring Physician: Carron Curie, MD  HPI: Richard Wang is a 73 y.o. male seen for follow up of Acute on Chronic Respiratory Failure.  Patient currently is on pressure support mode has endotracheal tube in place awaiting tracheostomy is failed extubation several times previously.  The family wants to proceed with tracheostomy apparently  Medications: Reviewed on Rounds  Physical Exam:  Vitals: Temperature 98.0 pulse 72 respiratory rate 26 blood pressure 166/81 saturations 100%  Ventilator Settings mode ventilation pressure support FiO2 28% pressure 12 PEEP 5  . General: Comfortable at this time . Eyes: Grossly normal lids, irises & conjunctiva . ENT: grossly tongue is normal . Neck: no obvious mass . Cardiovascular: S1 S2 normal no gallop . Respiratory: Scattered rhonchi are noted at this time . Abdomen: soft . Skin: no rash seen on limited exam . Musculoskeletal: not rigid . Psychiatric:unable to assess . Neurologic: no seizure no involuntary movements         Lab Data:   Basic Metabolic Panel: Recent Labs  Lab 05/05/18 0818 05/06/18 0659 05/06/18 1405 05/07/18 0453 05/08/18 0431 05/09/18 0545  NA 141  --  141 144 147* 144  K 3.5  --  3.8 3.4* 3.6 3.7  CL 112*  --  111 113* 113* 111  CO2 22  --  21* 24 26 22   GLUCOSE 120*  --  176* 117* 127* 102*  BUN 44*  --  45* 47* 53* 50*  CREATININE 0.95  --  1.02 1.15 1.15 1.12  CALCIUM 8.6*  --  8.9 9.2 9.4 9.4  MG 1.6* 2.0  --  1.8 1.9 1.7  PHOS  --   --  4.0 4.0 4.4  --     ABG: No results for input(s): PHART, PCO2ART, PO2ART, HCO3, O2SAT in the last 168 hours.  Liver Function Tests: Recent Labs  Lab 05/06/18 1405 05/07/18 0453  ALBUMIN 2.8* 2.7*   No results for input(s): LIPASE, AMYLASE in the last  168 hours. No results for input(s): AMMONIA in the last 168 hours.  CBC: Recent Labs  Lab 05/05/18 0818 05/06/18 0659 05/07/18 0453 05/08/18 0431 05/09/18 0545  WBC 13.9* 14.6* 16.2* 13.2* 15.4*  HGB 8.8* 8.6* 7.3* 7.0* 8.1*  HCT 28.0* 27.9* 23.5* 23.5* 25.5*  MCV 90.3 90.3 90.7 92.5 88.2  PLT 116* 127* 127* 128* 122*    Cardiac Enzymes: No results for input(s): CKTOTAL, CKMB, CKMBINDEX, TROPONINI in the last 168 hours.  BNP (last 3 results) Recent Labs    04/28/18 1052  BNP 1,239.6*    ProBNP (last 3 results) No results for input(s): PROBNP in the last 8760 hours.  Radiological Exams: No results found.  Assessment/Plan Active Problems:   Acute on chronic respiratory failure with hypoxia (HCC)   Severe sepsis (HCC)   Chronic atrial fibrillation   Non-STEMI (non-ST elevated myocardial infarction) (HCC)   Acute systolic heart failure (HCC)   1. Acute on chronic respiratory failure with hypoxia because of his previous failure to be able to be extubated patient is is going to be needing a tracheostomy ENT consultation has been placed and will be scheduled. 2. Severe sepsis hemodynamically stable treated we will continue to monitor 3. Chronic atrial fibrillation rate is controlled at this time 4. Non-STEMI at baseline we will continue present management  5. Acute systolic heart failure at baseline we will continue with supportive care   I have personally seen and evaluated the patient, evaluated laboratory and imaging results, formulated the assessment and plan and placed orders. The Patient requires high complexity decision making for assessment and support.  Case was discussed on Rounds with the Respiratory Therapy Staff  Yevonne Pax, MD [facility] Pulmonary Critical Care Medicine Sleep Medicine

## 2018-05-10 DIAGNOSIS — A419 Sepsis, unspecified organism: Secondary | ICD-10-CM

## 2018-05-10 DIAGNOSIS — I214 Non-ST elevation (NSTEMI) myocardial infarction: Secondary | ICD-10-CM

## 2018-05-10 DIAGNOSIS — J9621 Acute and chronic respiratory failure with hypoxia: Secondary | ICD-10-CM

## 2018-05-10 DIAGNOSIS — I5021 Acute systolic (congestive) heart failure: Secondary | ICD-10-CM | POA: Diagnosis not present

## 2018-05-10 DIAGNOSIS — I482 Chronic atrial fibrillation, unspecified: Secondary | ICD-10-CM

## 2018-05-10 LAB — CBC
HCT: 26.3 % — ABNORMAL LOW (ref 39.0–52.0)
Hemoglobin: 8.1 g/dL — ABNORMAL LOW (ref 13.0–17.0)
MCH: 28.2 pg (ref 26.0–34.0)
MCHC: 30.8 g/dL (ref 30.0–36.0)
MCV: 91.6 fL (ref 80.0–100.0)
NRBC: 0 % (ref 0.0–0.2)
Platelets: 153 10*3/uL (ref 150–400)
RBC: 2.87 MIL/uL — ABNORMAL LOW (ref 4.22–5.81)
RDW: 17.8 % — ABNORMAL HIGH (ref 11.5–15.5)
WBC: 15.8 10*3/uL — ABNORMAL HIGH (ref 4.0–10.5)

## 2018-05-10 LAB — BASIC METABOLIC PANEL
Anion gap: 9 (ref 5–15)
BUN: 40 mg/dL — ABNORMAL HIGH (ref 8–23)
CO2: 24 mmol/L (ref 22–32)
Calcium: 9 mg/dL (ref 8.9–10.3)
Chloride: 110 mmol/L (ref 98–111)
Creatinine, Ser: 1.1 mg/dL (ref 0.61–1.24)
GFR calc non Af Amer: >60 mL/min (ref >60)
Glucose, Bld: 114 mg/dL — ABNORMAL HIGH (ref 70–99)
Potassium: 3.4 mmol/L — ABNORMAL LOW (ref 3.5–5.1)
SODIUM: 143 mmol/L (ref 135–145)

## 2018-05-10 LAB — MAGNESIUM: Magnesium: 2.2 mg/dL (ref 1.7–2.4)

## 2018-05-10 LAB — PHOSPHORUS: Phosphorus: 3.6 mg/dL (ref 2.5–4.6)

## 2018-05-10 NOTE — Progress Notes (Addendum)
Pulmonary Critical Care Medicine [facility] [place]   PULMONARY CRITICAL CARE SERVICE  PROGRESS NOTE  Date of Service: 05/10/2018  Richard Wang  [id number]:[id number]  DOB: 1945-12-14   DOA: 04/26/2018  Referring Physician: Carron Curie, MD  HPI: Richard Wang is a 73 y.o. male seen for follow up of Acute on Chronic Respiratory Failure.  Remains on pressure support mode right now endotracheally intubated.  He is awake and alert is actually had excellent volumes.  Will discuss with the primary care team if he should be given a trial of extubation as he is showing significant improvement.  1 issue that remains with him that he has had failed extubation was previously and this is why we had requested a tracheostomy.  Medications: Reviewed on Rounds  Physical Exam:  Vitals: Temperature 98.0 pulse 72 respiratory 15 blood pressure 189/72 saturations 99%  Ventilator Settings temperature 98.0 pulse 72 respiratory 15 blood pressure 189/72 saturations 99%  . General: Comfortable at this time . Eyes: Grossly normal lids, irises & conjunctiva . ENT: grossly tongue is normal . Neck: no obvious mass . Cardiovascular: S1 S2 normal no gallop . Respiratory: Scattered rhonchi expansion is equal . Abdomen: soft . Skin: no rash seen on limited exam . Musculoskeletal: not rigid . Psychiatric:unable to assess . Neurologic: no seizure no involuntary movements         Lab Data:   Basic Metabolic Panel: Recent Labs  Lab 05/06/18 0659 05/06/18 1405 05/07/18 0453 05/08/18 0431 05/09/18 0545 05/10/18 0624  NA  --  141 144 147* 144 143  K  --  3.8 3.4* 3.6 3.7 3.4*  CL  --  111 113* 113* 111 110  CO2  --  21* 24 26 22 24   GLUCOSE  --  176* 117* 127* 102* 114*  BUN  --  45* 47* 53* 50* 40*  CREATININE  --  1.02 1.15 1.15 1.12 1.10  CALCIUM  --  8.9 9.2 9.4 9.4 9.0  MG 2.0  --  1.8 1.9 1.7 2.2  PHOS  --  4.0 4.0 4.4  --  3.6    ABG: No results for input(s): PHART, PCO2ART, PO2ART, HCO3,  O2SAT in the last 168 hours.  Liver Function Tests: Recent Labs  Lab 05/06/18 1405 05/07/18 0453  ALBUMIN 2.8* 2.7*   No results for input(s): LIPASE, AMYLASE in the last 168 hours. No results for input(s): AMMONIA in the last 168 hours.  CBC: Recent Labs  Lab 05/06/18 0659 05/07/18 0453 05/08/18 0431 05/09/18 0545 05/10/18 0624  WBC 14.6* 16.2* 13.2* 15.4* 15.8*  HGB 8.6* 7.3* 7.0* 8.1* 8.1*  HCT 27.9* 23.5* 23.5* 25.5* 26.3*  MCV 90.3 90.7 92.5 88.2 91.6  PLT 127* 127* 128* 122* 153    Cardiac Enzymes: No results for input(s): CKTOTAL, CKMB, CKMBINDEX, TROPONINI in the last 168 hours.  BNP (last 3 results) Recent Labs    04/28/18 1052  BNP 1,239.6*    ProBNP (last 3 results) No results for input(s): PROBNP in the last 8760 hours.  Radiological Exams: No results found.  Assessment/Plan Active Problems:   Acute on chronic respiratory failure with hypoxia (HCC)   Severe sepsis (HCC)   Chronic atrial fibrillation   Non-STEMI (non-ST elevated myocardial infarction) (HCC)   Acute systolic heart failure (HCC)   1. Acute on chronic respiratory failure with hypoxia right now remains on pressure support continuously.  He is actually done very well has had excellent volumes.  We will have respiratory therapy check  cuff leak and also discussed with the primary care team whether to give him a trial of extubation to BiPAP 2. Severe sepsis hemodynamically stable 3. Chronic atrial fibrillation rate is controlled at this time 4. Non-STEMI treated improved 5. Acute systolic heart failure compensated we will continue with present management   I have personally seen and evaluated the patient, evaluated laboratory and imaging results, formulated the assessment and plan and placed orders. The Patient requires high complexity decision making for assessment and support.  Case was discussed on Rounds with the Respiratory Therapy Staff  Yevonne Pax, MD [facility] Pulmonary  Critical Care Medicine Sleep Medicine

## 2018-05-11 ENCOUNTER — Other Ambulatory Visit (HOSPITAL_COMMUNITY): Payer: Self-pay

## 2018-05-11 DIAGNOSIS — I214 Non-ST elevation (NSTEMI) myocardial infarction: Secondary | ICD-10-CM | POA: Diagnosis not present

## 2018-05-11 DIAGNOSIS — I5021 Acute systolic (congestive) heart failure: Secondary | ICD-10-CM | POA: Diagnosis not present

## 2018-05-11 DIAGNOSIS — J9621 Acute and chronic respiratory failure with hypoxia: Secondary | ICD-10-CM | POA: Diagnosis not present

## 2018-05-11 DIAGNOSIS — I482 Chronic atrial fibrillation, unspecified: Secondary | ICD-10-CM | POA: Diagnosis not present

## 2018-05-11 LAB — POTASSIUM: Potassium: 3.8 mmol/L (ref 3.5–5.1)

## 2018-05-11 NOTE — Progress Notes (Addendum)
Pulmonary Critical Care Medicine [facility] [place]   PULMONARY CRITICAL CARE SERVICE  PROGRESS NOTE  Date of Service: 05/11/2018  Chantry Torchio  [id number]  DOB: Oct 04, 1945   DOA: 04/26/2018  Referring Physician: Carron Curie, MD  HPI: Richard Wang is a 73 y.o. male seen for follow up of Acute on Chronic Respiratory Failure.  Patient is comfortable right now on pressure support endotracheally intubated.  He has been on pressure support now for several days has excellent tidal volumes.  Spoke with the patient and he stated he is willing to give an extubation trial a chance.  He understands that if he fails he will have to be reintubated  Medications: Reviewed on Rounds  Physical Exam:  Vitals: Temperature 97.8 pulse 95 respiratory 21 blood pressure 160/78 saturation 96%  Ventilator Settings currently on pressure support FiO2 28% tidal volume is 1271 PEEP 5 pressure support 12  . General: Comfortable at this time . Eyes: Grossly normal lids, irises & conjunctiva . ENT: grossly tongue is normal . Neck: no obvious mass . Cardiovascular: S1 S2 normal no gallop . Respiratory: No rhonchi or rales are noted at this time . Abdomen: soft . Skin: no rash seen on limited exam . Musculoskeletal: not rigid . Psychiatric:unable to assess . Neurologic: no seizure no involuntary movements         Lab Data:   Basic Metabolic Panel: Recent Labs  Lab 05/06/18 0659 05/06/18 1405 05/07/18 0453 05/08/18 0431 05/09/18 0545 05/10/18 0624 05/11/18 0401  NA  --  141 144 147* 144 143  --   K  --  3.8 3.4* 3.6 3.7 3.4* 3.8  CL  --  111 113* 113* 111 110  --   CO2  --  21* 24 26 22 24   --   GLUCOSE  --  176* 117* 127* 102* 114*  --   BUN  --  45* 47* 53* 50* 40*  --   CREATININE  --  1.02 1.15 1.15 1.12 1.10  --   CALCIUM  --  8.9 9.2 9.4 9.4 9.0  --   MG 2.0  --  1.8 1.9 1.7 2.2  --   PHOS  --  4.0 4.0 4.4  --  3.6  --     ABG: No results for input(s): PHART, PCO2ART,  PO2ART, HCO3, O2SAT in the last 168 hours.  Liver Function Tests: Recent Labs  Lab 05/06/18 1405 05/07/18 0453  ALBUMIN 2.8* 2.7*   No results for input(s): LIPASE, AMYLASE in the last 168 hours. No results for input(s): AMMONIA in the last 168 hours.  CBC: Recent Labs  Lab 05/06/18 0659 05/07/18 0453 05/08/18 0431 05/09/18 0545 05/10/18 0624  WBC 14.6* 16.2* 13.2* 15.4* 15.8*  HGB 8.6* 7.3* 7.0* 8.1* 8.1*  HCT 27.9* 23.5* 23.5* 25.5* 26.3*  MCV 90.3 90.7 92.5 88.2 91.6  PLT 127* 127* 128* 122* 153    Cardiac Enzymes: No results for input(s): CKTOTAL, CKMB, CKMBINDEX, TROPONINI in the last 168 hours.  BNP (last 3 results) Recent Labs    04/28/18 1052  BNP 1,239.6*    ProBNP (last 3 results) No results for input(s): PROBNP in the last 8760 hours.  Radiological Exams: Dg Abd Portable 1v  Result Date: 05/11/2018 CLINICAL DATA:  Initial evaluation for NG tube placement EXAM: PORTABLE ABDOMEN - 1 VIEW COMPARISON:  Prior radiograph from 05/01/2018 FINDINGS: Enteric tube in place with tip overlying the stomach, side hole well beyond the GE junction. Tube is coiled within  the gastric body. Visualized bowel gas pattern is nonobstructive. Mild subsegmental atelectatic changes at the right lung base. IMPRESSION: Enteric tube coiled within the stomach, side hole well beyond the GE junction. Electronically Signed   By: Rise Mu M.D.   On: 05/11/2018 04:39    Assessment/Plan Active Problems:   Acute on chronic respiratory failure with hypoxia (HCC)   Severe sepsis (HCC)   Chronic atrial fibrillation   Non-STEMI (non-ST elevated myocardial infarction) (HCC)   Acute systolic heart failure (HCC)   1. Acute on chronic respiratory failure with hypoxia right now on pressure support we will proceed to extubate 2. Severe sepsis hemodynamically stable resolved 3. Chronic atrial fibrillation rate is controlled at this time 4. Non-STEMI resolved with supportive  care 5. Acute systolic heart failure at baseline   I have personally seen and evaluated the patient, evaluated laboratory and imaging results, formulated the assessment and plan and placed orders. The Patient requires high complexity decision making for assessment and support.  Case was discussed on Rounds with the Respiratory Therapy Staff  Yevonne Pax, MD [facility] Pulmonary Critical Care Medicine Sleep Medicine

## 2018-05-12 ENCOUNTER — Other Ambulatory Visit (HOSPITAL_COMMUNITY): Payer: Self-pay

## 2018-05-12 DIAGNOSIS — I482 Chronic atrial fibrillation, unspecified: Secondary | ICD-10-CM | POA: Diagnosis not present

## 2018-05-12 DIAGNOSIS — J9621 Acute and chronic respiratory failure with hypoxia: Secondary | ICD-10-CM | POA: Diagnosis not present

## 2018-05-12 DIAGNOSIS — I214 Non-ST elevation (NSTEMI) myocardial infarction: Secondary | ICD-10-CM | POA: Diagnosis not present

## 2018-05-12 DIAGNOSIS — I5021 Acute systolic (congestive) heart failure: Secondary | ICD-10-CM | POA: Diagnosis not present

## 2018-05-12 LAB — RENAL FUNCTION PANEL
Albumin: 2.9 g/dL — ABNORMAL LOW (ref 3.5–5.0)
Anion gap: 9 (ref 5–15)
BUN: 30 mg/dL — ABNORMAL HIGH (ref 8–23)
CO2: 25 mmol/L (ref 22–32)
Calcium: 8.8 mg/dL — ABNORMAL LOW (ref 8.9–10.3)
Chloride: 109 mmol/L (ref 98–111)
Creatinine, Ser: 1.03 mg/dL (ref 0.61–1.24)
GFR calc non Af Amer: >60 mL/min (ref >60)
Glucose, Bld: 93 mg/dL (ref 70–99)
Phosphorus: 3.4 mg/dL (ref 2.5–4.6)
Potassium: 3.5 mmol/L (ref 3.5–5.1)
Sodium: 143 mmol/L (ref 135–145)

## 2018-05-12 LAB — CBC
HCT: 26.7 % — ABNORMAL LOW (ref 39.0–52.0)
HEMOGLOBIN: 8.2 g/dL — AB (ref 13.0–17.0)
MCH: 29.2 pg (ref 26.0–34.0)
MCHC: 30.7 g/dL (ref 30.0–36.0)
MCV: 95 fL (ref 80.0–100.0)
Platelets: 156 10*3/uL (ref 150–400)
RBC: 2.81 MIL/uL — ABNORMAL LOW (ref 4.22–5.81)
RDW: 18.5 % — ABNORMAL HIGH (ref 11.5–15.5)
WBC: 10.3 10*3/uL (ref 4.0–10.5)
nRBC: 0 % (ref 0.0–0.2)

## 2018-05-12 LAB — BLOOD GAS, ARTERIAL
Acid-Base Excess: 2.7 mmol/L — ABNORMAL HIGH (ref 0.0–2.0)
Bicarbonate: 26.5 mmol/L (ref 20.0–28.0)
FIO2: 21
O2 Saturation: 99.6 %
PATIENT TEMPERATURE: 97.5
pCO2 arterial: 37.4 mmHg (ref 32.0–48.0)
pH, Arterial: 7.46 — ABNORMAL HIGH (ref 7.350–7.450)
pO2, Arterial: 145 mmHg — ABNORMAL HIGH (ref 83.0–108.0)

## 2018-05-12 LAB — MAGNESIUM: Magnesium: 2 mg/dL (ref 1.7–2.4)

## 2018-05-12 NOTE — Progress Notes (Addendum)
Pulmonary Critical Care Medicine [facility] [place]   PULMONARY CRITICAL CARE SERVICE  PROGRESS NOTE  Date of Service: 05/12/2018  Richard Wang  [id number]:[id number]  DOB: [birth year]   DOA: 04/26/2018  Referring Physician: Carron Curie, MD  HPI: Richard Wang is a 73 y.o. male seen for follow up of Acute on Chronic Respiratory Failure.  Patient is extubated doing well he has been on BiPAP overnight  Medications: Reviewed on Rounds  Physical Exam:  Vitals: Temperature 98.1 pulse 68 respiratory 14 blood pressure 174/87 saturations 100%  Ventilator Settings extubated off the ventilator right now  . General: Comfortable at this time . Eyes: Grossly normal lids, irises & conjunctiva . ENT: grossly tongue is normal . Neck: no obvious mass . Cardiovascular: S1 S2 normal no gallop . Respiratory: No rhonchi or rales are noted at this time . Abdomen: soft . Skin: no rash seen on limited exam . Musculoskeletal: not rigid . Psychiatric:unable to assess . Neurologic: no seizure no involuntary movements         Lab Data:   Basic Metabolic Panel: Recent Labs  Lab 05/06/18 1405 05/07/18 0453 05/08/18 0431 05/09/18 0545 05/10/18 0624 05/11/18 0401 05/12/18 0515  NA 141 144 147* 144 143  --  143  K 3.8 3.4* 3.6 3.7 3.4* 3.8 3.5  CL 111 113* 113* 111 110  --  109  CO2 21* 24 26 22 24   --  25  GLUCOSE 176* 117* 127* 102* 114*  --  93  BUN 45* 47* 53* 50* 40*  --  30*  CREATININE 1.02 1.15 1.15 1.12 1.10  --  1.03  CALCIUM 8.9 9.2 9.4 9.4 9.0  --  8.8*  MG  --  1.8 1.9 1.7 2.2  --  2.0  PHOS 4.0 4.0 4.4  --  3.6  --  3.4    ABG: Recent Labs  Lab 05/12/18 0643  PHART 7.460*  PCO2ART 37.4  PO2ART 145*  HCO3 26.5  O2SAT 99.6    Liver Function Tests: Recent Labs  Lab 05/06/18 1405 05/07/18 0453 05/12/18 0515  ALBUMIN 2.8* 2.7* 2.9*   No results for input(s): LIPASE, AMYLASE in the last 168 hours. No results for input(s): AMMONIA in the last 168  hours.  CBC: Recent Labs  Lab 05/07/18 0453 05/08/18 0431 05/09/18 0545 05/10/18 0624 05/12/18 0515  WBC 16.2* 13.2* 15.4* 15.8* 10.3  HGB 7.3* 7.0* 8.1* 8.1* 8.2*  HCT 23.5* 23.5* 25.5* 26.3* 26.7*  MCV 90.7 92.5 88.2 91.6 95.0  PLT 127* 128* 122* 153 156    Cardiac Enzymes: No results for input(s): CKTOTAL, CKMB, CKMBINDEX, TROPONINI in the last 168 hours.  BNP (last 3 results) Recent Labs    04/28/18 1052  BNP 1,239.6*    ProBNP (last 3 results) No results for input(s): PROBNP in the last 8760 hours.  Radiological Exams: Dg Abd Portable 1v  Result Date: 05/11/2018 CLINICAL DATA:  Initial evaluation for NG tube placement EXAM: PORTABLE ABDOMEN - 1 VIEW COMPARISON:  Prior radiograph from 05/01/2018 FINDINGS: Enteric tube in place with tip overlying the stomach, side hole well beyond the GE junction. Tube is coiled within the gastric body. Visualized bowel gas pattern is nonobstructive. Mild subsegmental atelectatic changes at the right lung base. IMPRESSION: Enteric tube coiled within the stomach, side hole well beyond the GE junction. Electronically Signed   By: Rise Mu M.D.   On: 05/11/2018 04:39    Assessment/Plan Active Problems:   Acute on chronic respiratory failure with  hypoxia (HCC)   Severe sepsis (HCC)   Chronic atrial fibrillation   Non-STEMI (non-ST elevated myocardial infarction) (HCC)   Acute systolic heart failure (HCC)   1. Acute on chronic respiratory failure with hypoxia successful extubation 24 hours patient will use the BiPAP as necessary now 2. Severe sepsis resolved we will continue with supportive care 3. Chronic atrial fibrillation currently rate is controlled we will monitor 4. Non-STEMI resolved at baseline we will continue supportive care 5. Acute systolic heart failure is fluid status and diuresis as necessary   I have personally seen and evaluated the patient, evaluated laboratory and imaging results, formulated the  assessment and plan and placed orders. The Patient requires high complexity decision making for assessment and support.  Case was discussed on Rounds with the Respiratory Therapy Staff  Yevonne Pax, MD [facility] Pulmonary Critical Care Medicine Sleep Medicine

## 2018-05-13 ENCOUNTER — Inpatient Hospital Stay
Admission: RE | Admit: 2018-05-13 | Discharge: 2018-05-17 | Disposition: A | Discharge status: Still In Hospital | Payer: Medicare Other | Attending: Internal Medicine | Admitting: Internal Medicine

## 2018-05-13 DIAGNOSIS — A419 Sepsis, unspecified organism: Secondary | ICD-10-CM | POA: Diagnosis not present

## 2018-05-13 DIAGNOSIS — J9621 Acute and chronic respiratory failure with hypoxia: Secondary | ICD-10-CM | POA: Diagnosis present

## 2018-05-13 DIAGNOSIS — I482 Chronic atrial fibrillation, unspecified: Secondary | ICD-10-CM | POA: Diagnosis present

## 2018-05-13 DIAGNOSIS — I214 Non-ST elevation (NSTEMI) myocardial infarction: Secondary | ICD-10-CM | POA: Diagnosis present

## 2018-05-13 DIAGNOSIS — I5021 Acute systolic (congestive) heart failure: Secondary | ICD-10-CM | POA: Diagnosis present

## 2018-05-13 DIAGNOSIS — R6520 Severe sepsis without septic shock: Secondary | ICD-10-CM

## 2018-05-13 LAB — DIGOXIN LEVEL: Digoxin Level: 0.3 ng/mL — ABNORMAL LOW (ref 0.8–2.0)

## 2018-05-13 NOTE — Progress Notes (Addendum)
Pulmonary Critical Care Medicine [facility] [place]   PULMONARY CRITICAL CARE SERVICE  PROGRESS NOTE  Date of Service: 05/13/2018  Richard Wang  [id number]  DOB: [birth year]   DOA: 05/13/2018  Referring Physician: Carron Curie, MD  HPI: Richard Wang is a 73 y.o. male seen for follow up of Acute on Chronic Respiratory Failure.  Patient appears to be doing well at this time remains on room air.  No acute distress noted.  Medications: Reviewed on Rounds  Physical Exam:  Vitals: Pulse 75 respirations 25 BP 139/73 O2 sat 99% temp 98.6  Ventilator Settings not currently on ventilator  . General: Comfortable at this time . Eyes: Grossly normal lids, irises & conjunctiva . ENT: grossly tongue is normal . Neck: no obvious mass . Cardiovascular: S1 S2 normal no gallop . Respiratory: No rales or rhonchi noted . Abdomen: soft . Skin: no rash seen on limited exam . Musculoskeletal: not rigid . Psychiatric:unable to assess . Neurologic: no seizure no involuntary movements         Lab Data:   Basic Metabolic Panel: Recent Labs  Lab 05/07/18 0453 05/08/18 0431 05/09/18 0545 05/10/18 0624 05/11/18 0401 05/12/18 0515  NA 144 147* 144 143  --  143  K 3.4* 3.6 3.7 3.4* 3.8 3.5  CL 113* 113* 111 110  --  109  CO2 24 26 22 24   --  25  GLUCOSE 117* 127* 102* 114*  --  93  BUN 47* 53* 50* 40*  --  30*  CREATININE 1.15 1.15 1.12 1.10  --  1.03  CALCIUM 9.2 9.4 9.4 9.0  --  8.8*  MG 1.8 1.9 1.7 2.2  --  2.0  PHOS 4.0 4.4  --  3.6  --  3.4    ABG: Recent Labs  Lab 05/12/18 0643  PHART 7.460*  PCO2ART 37.4  PO2ART 145*  HCO3 26.5  O2SAT 99.6    Liver Function Tests: Recent Labs  Lab 05/07/18 0453 05/12/18 0515  ALBUMIN 2.7* 2.9*   No results for input(s): LIPASE, AMYLASE in the last 168 hours. No results for input(s): AMMONIA in the last 168 hours.  CBC: Recent Labs  Lab 05/07/18 0453 05/08/18 0431 05/09/18 0545 05/10/18 0624 05/12/18 0515   WBC 16.2* 13.2* 15.4* 15.8* 10.3  HGB 7.3* 7.0* 8.1* 8.1* 8.2*  HCT 23.5* 23.5* 25.5* 26.3* 26.7*  MCV 90.7 92.5 88.2 91.6 95.0  PLT 127* 128* 122* 153 156    Cardiac Enzymes: No results for input(s): CKTOTAL, CKMB, CKMBINDEX, TROPONINI in the last 168 hours.  BNP (last 3 results) Recent Labs    04/28/18 1052  BNP 1,239.6*    ProBNP (last 3 results) No results for input(s): PROBNP in the last 8760 hours.  Radiological Exams: Dg Abd 1 View  Result Date: 05/12/2018 CLINICAL DATA:  NG tube placement EXAM: ABDOMEN - 1 VIEW COMPARISON:  05/11/2018 FINDINGS: 1628 hours. The tip of the NG tube overlies the mid stomach. Proximal port of the NG tube is just distal to the EG junction. Nonspecific bowel gas pattern in the visualized upper abdomen. Telemetry leads overlie the chest. IMPRESSION: NG tube tip is in the mid stomach. Electronically Signed   By: Kennith Center M.D.   On: 05/12/2018 17:45    Assessment/Plan Active Problems:   * No active hospital problems. *   1. Acute on chronic respiratory failure with hypoxia patient was extubated and was on BiPAP however is now on room air doing well.  2. Severe sepsis resolved continues. 3. Chronic atrial fibrillation rate controlled continue to monitor 4. Non-STEMI resolved at baseline continue support care 5. Acute systolic heart failure continue to monitor fluid status diurese as necessary.   I have personally seen and evaluated the patient, evaluated laboratory and imaging results, formulated the assessment and plan and placed orders. The Patient requires high complexity decision making for assessment and support.  Case was discussed on Rounds with the Respiratory Therapy Staff  Yevonne Pax, MD [facility] Pulmonary Critical Care Medicine Sleep Medicine

## 2018-05-14 ENCOUNTER — Encounter: Payer: Self-pay | Admitting: Internal Medicine

## 2018-05-14 ENCOUNTER — Other Ambulatory Visit (HOSPITAL_COMMUNITY): Payer: Medicare Other

## 2018-05-14 DIAGNOSIS — R6520 Severe sepsis without septic shock: Secondary | ICD-10-CM

## 2018-05-14 DIAGNOSIS — I5021 Acute systolic (congestive) heart failure: Secondary | ICD-10-CM | POA: Diagnosis present

## 2018-05-14 DIAGNOSIS — A419 Sepsis, unspecified organism: Secondary | ICD-10-CM | POA: Diagnosis not present

## 2018-05-14 DIAGNOSIS — J9621 Acute and chronic respiratory failure with hypoxia: Secondary | ICD-10-CM | POA: Insufficient documentation

## 2018-05-14 DIAGNOSIS — I482 Chronic atrial fibrillation, unspecified: Secondary | ICD-10-CM | POA: Diagnosis present

## 2018-05-14 DIAGNOSIS — I214 Non-ST elevation (NSTEMI) myocardial infarction: Secondary | ICD-10-CM | POA: Diagnosis present

## 2018-05-14 SURGERY — CREATION, TRACHEOSTOMY
Anesthesia: General

## 2018-05-14 NOTE — Progress Notes (Addendum)
Pulmonary Critical Care Medicine [facility] [place]   PULMONARY CRITICAL CARE SERVICE  PROGRESS NOTE  Date of Service: 05/14/2018  Richard Wang  VQX:[id number]  DOB: September 12, 1945   DOA: 05/13/2018  Referring Physician: Carron Curie, MD  HPI: Richard Wang is a 73 y.o. male seen for follow up of Acute on Chronic Respiratory Failure.  Patient continues on room air doing well at this time.  Medications: Reviewed on Rounds  Physical Exam:  Vitals: Pulse 59 respirations 22 BP 98/52 O2 sat 98% temp 97.7  Ventilator Settings not currently on ventilator  . General: Comfortable at this time . Eyes: Grossly normal lids, irises & conjunctiva . ENT: grossly tongue is normal . Neck: no obvious mass . Cardiovascular: S1 S2 normal no gallop . Respiratory: No rales or rhonchi noted . Abdomen: soft . Skin: no rash seen on limited exam . Musculoskeletal: not rigid . Psychiatric:unable to assess . Neurologic: no seizure no involuntary movements         Lab Data:   Basic Metabolic Panel: Recent Labs  Lab 05/08/18 0431 05/09/18 0545 05/10/18 0624 05/11/18 0401 05/12/18 0515  NA 147* 144 143  --  143  K 3.6 3.7 3.4* 3.8 3.5  CL 113* 111 110  --  109  CO2 26 22 24   --  25  GLUCOSE 127* 102* 114*  --  93  BUN 53* 50* 40*  --  30*  CREATININE 1.15 1.12 1.10  --  1.03  CALCIUM 9.4 9.4 9.0  --  8.8*  MG 1.9 1.7 2.2  --  2.0  PHOS 4.4  --  3.6  --  3.4    ABG: Recent Labs  Lab 05/12/18 0643  PHART 7.460*  PCO2ART 37.4  PO2ART 145*  HCO3 26.5  O2SAT 99.6    Liver Function Tests: Recent Labs  Lab 05/12/18 0515  ALBUMIN 2.9*   No results for input(s): LIPASE, AMYLASE in the last 168 hours. No results for input(s): AMMONIA in the last 168 hours.  CBC: Recent Labs  Lab 05/08/18 0431 05/09/18 0545 05/10/18 0624 05/12/18 0515  WBC 13.2* 15.4* 15.8* 10.3  HGB 7.0* 8.1* 8.1* 8.2*  HCT 23.5* 25.5* 26.3* 26.7*  MCV 92.5 88.2 91.6 95.0  PLT 128* 122* 153  156    Cardiac Enzymes: No results for input(s): CKTOTAL, CKMB, CKMBINDEX, TROPONINI in the last 168 hours.  BNP (last 3 results) Recent Labs    04/28/18 1052  BNP 1,239.6*    ProBNP (last 3 results) No results for input(s): PROBNP in the last 8760 hours.  Radiological Exams: Dg Abd 1 View  Result Date: 05/12/2018 CLINICAL DATA:  NG tube placement EXAM: ABDOMEN - 1 VIEW COMPARISON:  05/11/2018 FINDINGS: 1628 hours. The tip of the NG tube overlies the mid stomach. Proximal port of the NG tube is just distal to the EG junction. Nonspecific bowel gas pattern in the visualized upper abdomen. Telemetry leads overlie the chest. IMPRESSION: NG tube tip is in the mid stomach. Electronically Signed   By: Kennith Center M.D.   On: 05/12/2018 17:45    Assessment/Plan Active Problems:   Acute and chronic respiratory failure with hypoxia (HCC)   Acute systolic heart failure (HCC)   Chronic atrial fibrillation   Acute myocardial infarction, subendocardial infarction, subsequent episode of care (HCC)   Unspecified septicemia(038.9) (HCC)   1. Acute on chronic respiratory failure with hypoxia patient doing well on room air currently. 2. Severe sepsis resolved 3. Chronic atrial  fibrillation rate controlled continue to monitor 4. Non-STEMI resolved at baseline continue supportive care 5. Acute systolic heart failure continue to monitor fluid status, diurese as necessary   I have personally seen and evaluated the patient, evaluated laboratory and imaging results, formulated the assessment and plan and placed orders. The Patient requires high complexity decision making for assessment and support.  Case was discussed on Rounds with the Respiratory Therapy Staff  Yevonne Pax, MD [facility] Pulmonary Critical Care Medicine Sleep Medicine

## 2018-05-15 DIAGNOSIS — I482 Chronic atrial fibrillation, unspecified: Secondary | ICD-10-CM | POA: Diagnosis not present

## 2018-05-15 DIAGNOSIS — I214 Non-ST elevation (NSTEMI) myocardial infarction: Secondary | ICD-10-CM | POA: Diagnosis not present

## 2018-05-15 DIAGNOSIS — J9621 Acute and chronic respiratory failure with hypoxia: Secondary | ICD-10-CM | POA: Diagnosis not present

## 2018-05-15 DIAGNOSIS — A419 Sepsis, unspecified organism: Secondary | ICD-10-CM | POA: Diagnosis not present

## 2018-05-15 LAB — BASIC METABOLIC PANEL
Anion gap: 8 (ref 5–15)
BUN: 22 mg/dL (ref 8–23)
CO2: 23 mmol/L (ref 22–32)
Calcium: 8.6 mg/dL — ABNORMAL LOW (ref 8.9–10.3)
Chloride: 113 mmol/L — ABNORMAL HIGH (ref 98–111)
Creatinine, Ser: 1.11 mg/dL (ref 0.61–1.24)
GFR calc Af Amer: >60 mL/min (ref >60)
GFR calc non Af Amer: >60 mL/min (ref >60)
Glucose, Bld: 109 mg/dL — ABNORMAL HIGH (ref 70–99)
Potassium: 3.6 mmol/L (ref 3.5–5.1)
Sodium: 144 mmol/L (ref 135–145)

## 2018-05-15 NOTE — Progress Notes (Addendum)
Pulmonary Critical Care Medicine [facility] [place]   PULMONARY CRITICAL CARE SERVICE  PROGRESS NOTE  Date of Service: 05/15/2018  EION KOLIN  UYE:[id number]  DOB: May 10, 1945   DOA: 05/13/2018  Referring Physician: Carron Curie, MD  HPI: THORVALD CAROTENUTO is a 73 y.o. male seen for follow up of Acute on Chronic Respiratory Failure.  Patient remains on room air doing well at this time with no secretions or breathing issues noted.  Medications: Reviewed on Rounds  Physical Exam:  Vitals: Pulse 78 respirations 20 BP 138/77 O2 sat 92% temp 98.1  Ventilator Settings not currently on ventilator  . General: Comfortable at this time . Eyes: Grossly normal lids, irises & conjunctiva . ENT: grossly tongue is normal . Neck: no obvious mass . Cardiovascular: S1 S2 normal no gallop . Respiratory: No rales or rhonchi noted . Abdomen: soft . Skin: no rash seen on limited exam . Musculoskeletal: not rigid . Psychiatric:unable to assess . Neurologic: no seizure no involuntary movements         Lab Data:   Basic Metabolic Panel: Recent Labs  Lab 05/09/18 0545 05/10/18 0624 05/11/18 0401 05/12/18 0515 05/15/18 1007  NA 144 143  --  143 144  K 3.7 3.4* 3.8 3.5 3.6  CL 111 110  --  109 113*  CO2 22 24  --  25 23  GLUCOSE 102* 114*  --  93 109*  BUN 50* 40*  --  30* 22  CREATININE 1.12 1.10  --  1.03 1.11  CALCIUM 9.4 9.0  --  8.8* 8.6*  MG 1.7 2.2  --  2.0  --   PHOS  --  3.6  --  3.4  --     ABG: Recent Labs  Lab 05/12/18 0643  PHART 7.460*  PCO2ART 37.4  PO2ART 145*  HCO3 26.5  O2SAT 99.6    Liver Function Tests: Recent Labs  Lab 05/12/18 0515  ALBUMIN 2.9*   No results for input(s): LIPASE, AMYLASE in the last 168 hours. No results for input(s): AMMONIA in the last 168 hours.  CBC: Recent Labs  Lab 05/09/18 0545 05/10/18 0624 05/12/18 0515  WBC 15.4* 15.8* 10.3  HGB 8.1* 8.1* 8.2*  HCT 25.5* 26.3* 26.7*  MCV 88.2 91.6 95.0  PLT 122* 153  156    Cardiac Enzymes: No results for input(s): CKTOTAL, CKMB, CKMBINDEX, TROPONINI in the last 168 hours.  BNP (last 3 results) Recent Labs    04/28/18 1052  BNP 1,239.6*    ProBNP (last 3 results) No results for input(s): PROBNP in the last 8760 hours.  Radiological Exams: No results found.  Assessment/Plan Active Problems:   Acute and chronic respiratory failure with hypoxia (HCC)   Acute systolic heart failure (HCC)   Chronic atrial fibrillation   Acute myocardial infarction, subendocardial infarction, subsequent episode of care (HCC)   Unspecified septicemia(038.9) (HCC)   1. Acute on chronic respiratory failure with hypoxia patient doing well on room air at this time. 2. Severe sepsis resolved 3. Chronic atrial fibrillation rate controlled continue to monitor 4. Non-STEMI resolved at baseline continue supportive care 5. Acute systolic heart failure continue to monitor fluid status diurese as necessary   I have personally seen and evaluated the patient, evaluated laboratory and imaging results, formulated the assessment and plan and placed orders. The Patient requires high complexity decision making for assessment and support.  Case was discussed on Rounds with the Respiratory Therapy Staff  Yevonne Pax, MD [facility]  Pulmonary Critical Care Medicine Sleep Medicine

## 2018-05-16 DIAGNOSIS — I214 Non-ST elevation (NSTEMI) myocardial infarction: Secondary | ICD-10-CM | POA: Diagnosis not present

## 2018-05-16 DIAGNOSIS — A419 Sepsis, unspecified organism: Secondary | ICD-10-CM | POA: Diagnosis not present

## 2018-05-16 DIAGNOSIS — J9621 Acute and chronic respiratory failure with hypoxia: Secondary | ICD-10-CM | POA: Diagnosis not present

## 2018-05-16 DIAGNOSIS — I482 Chronic atrial fibrillation, unspecified: Secondary | ICD-10-CM | POA: Diagnosis not present

## 2018-05-16 NOTE — PMR Pre-admission (Signed)
PMR Admission Coordinator Pre-Admission Assessment  Patient: Richard Wang is an 73 y.o., male MRN: [id number] DOB: [birth year] Height:   Weight:    Insurance Information HMO:     PPO:      PCP:      IPA:      80/20:      OTHER: no HMO PRIMARY: Medicare a and b      Policy#: [id number]      Subscriber: pt Benefits:  Phone #: passport one online     Name: 05/15/2018 Eff. Date: 08/21/2011     Deduct: $1408      Out of Pocket Max: none      Life Max: none CIR: 100%      SNF: 20 full days Outpatient: 80%     Co-Pay: 20% Home Health: 100%      Co-Pay: none DME: 80%     Co-Pay: 20% Providers: pt choice  SECONDARY: none       Patient has left 34 full medicare days, 20 co pay days and 60 lifetime reserve days left  Medicaid Application Date:       Case Manager:  Disability Application Date:       Case Worker:   Emergency Facilities manager Information    Name Relation Home Work [place] Niece   [phone]   Richard Wang Sister   [phone]      Current Medical History  Patient Admitting Diagnosis: Debility, respiratory Failure  History of Present Illness:  Richard Wang is a 73 year old right-handed male with history of COPD with remote tobacco abuse, CAD with non-STEMI maintained on aspirin, renal insufficiency, atrial fibrillation and hypertension. Initially presented to [facility] with increasing shortness of breath and lower extremity edema. Chest x-ray and echocardiogram showed moderate LV systolic dysfunction and moderate to severe MR and moderate pulmonary systolic hypertension consistent with CHF. Patient started on BiPAP however deteriorated and ended up having to be intubated placed on ventilator. Patient subsequently complications associated with worsening renal function, congestive heart failure. Attempt at extubation failed and patient was transferred to [facility] 04/26/2018 for ventilation wean. Initially on IV  Solu-Medrol and transitioned to by mouth prednisone after ventilation discontinued. Tracheostomy tube was not needed. Patient later extubated successfully and monitored. Currently maintained on prednisone 40 mg daily with slow taper. Subcutaneous heparin for DVT prophylaxis. Patient remained on aspirin therapy as well as Cardizem for history of atrial fibrillation. Swallow study completed maintained on a dysphagia #2 running thick liquid diet.   Patient's medical record from [facility]  has been reviewed by the rehabilitation admission coordinator and physician.  Past Medical History  Past Medical History:  Diagnosis Date  . Acute and chronic respiratory failure with hypoxia ([place])   . Acute myocardial infarction, subendocardial infarction, subsequent episode of care ([place])   . Acute systolic heart failure ([place])   . Chronic atrial fibrillation   . Sepsis with acute organ dysfunction ([place])   . Unspecified septicemia(038.9) ([place])     Family History   family history is not on file.  Prior Rehab/Hospitalizations Has the patient had prior rehab or hospitalizations prior to admission? Yes  Has the patient had major surgery during 100 days prior to admission? No   Current Medications See MAR  Patients Current Diet:  Diet Order            DIET DYS 2 Room service appropriate? Yes with Assist; Fluid consistency: Honey Thick  Diet effective  now              Precautions / Restrictions Precautions Precautions: Fall   Has the patient had 2 or more falls or a fall with injury in the past year? No  Prior Activity Level Limited Community (1-2x/wk): Indepndent without AD walked short distances 50 feet before becoming SOB; drove  Prior Functional Level Self Care: Did the patient need help bathing, dressing, using the toilet or eating? Independent  Indoor Mobility: Did the patient need assistance with walking from room to room (with or without device)? Independent  Stairs:  Did the patient need assistance with internal or external stairs (with or without device)? Independent  Functional Cognition: Did the patient need help planning regular tasks such as shopping or remembering to take medications? Independent  Home Assistive Devices / Equipment Home Assistive Devices/Equipment: None  Prior Device Use: Indicate devices/aids used by the patient prior to current illness, exacerbation or injury? None of the above   Prior Functional Level Current Functional Level  Bed Mobility  Independent  Min assist   Transfers  Independent  Mod assist   Mobility - Walk/Wheelchair  Independent(became SOB about 50 feet pta)  Mod assist bed to chair only   Upper Body Dressing  Independent  Min assist   Lower Body Dressing  Independent  Mod assist   Grooming  Independent  Min assist   Eating/Drinking  Independent  Min assist   Toilet Transfer  Independent  Mod assist   Bladder Continence   continent  indwelling catheter   Bowel Management  continent  continent   Stair Climbing   Independent  Other   Communication  independent  indpendent   Memory  intact some confusion per niece since admisison but clearing   Driving   yes      Special needs/care consideration BiPAP/CPAP  N/a CPM  N/a Continuous Drip IV  N/a Dialysis n/a Life Vest  N/a Oxygen  Room air Special Bed  N/a Trach Size  N/a Wound Vac n/a Skin Bilateral heels reddened Bowel mgmt:  continent Bladder mgmt:  Indwelling catheter Diabetic mgmt:  N/a Behavioral consideration some mild disorientation to situation and series of events leading to admission Chemo/radiation n/a   Previous Home Environment  Living Arrangements: Other relatives(lives with sister, brother in law and a niece with old TBI)  Lives With: Family Available Help at Discharge: Family, Available 24 hours/day Type of Home: House Home Layout: One level Bathroom Toilet: Standard Bathroom Accessibility:  Yes How Accessible: Accessible via walker Home Care Services: No  Discharge Living Setting Plans for Discharge Living Setting: Lives with (comment)(sister, and her family for 4 years) Type of Home at Discharge: House Discharge Home Layout: One level Discharge Bathroom Shower/Tub: Tub/shower unit Discharge Bathroom Toilet: Standard Discharge Bathroom Accessibility: Yes How Accessible: Accessible via walker Does the patient have any problems obtaining your medications?: No  Social/Family/Support Systems Contact Information: niece, Richard Wang Anticipated Caregiver: niece and sister Anticipated Caregiver's Contact Information: [phone] Ability/Limitations of Caregiver: this neice does not live with pt and her parents Caregiver Availability: 24/7 Discharge Plan Discussed with Primary Caregiver: Yes Is Caregiver In Agreement with Plan?: Yes Does Caregiver/Family have Issues with Lodging/Transportation while Pt is in Rehab?: No  Niece, Richard Wang, does not live with her parents and Uncle. Another niece lives with patient who has an old TBI.  Goals/Additional Needs Patient/Family Goal for Rehab: Mod I to superivsion PT, OT, and SLP Expected length of stay: ELOS 10 to 14 days Pt/Family  Agrees to Admission and willing to participate: Yes Program Orientation Provided & Reviewed with Pt/Caregiver Including Roles  & Responsibilities: Yes  Decrease burden of Care through IP rehab admission: n/a  Possible need for SNF placement upon discharge: not anticipated  Patient Condition: I have reviewed medical records from [place], spoken with CSW, patient, and niece. I met with patient at the bedside for inpatient rehabilitation assessment.  Patient will benefit from ongoing PT, OT, and SLP), can actively participate in 3 hours of therapy a day 5 days of the week, and can make measurable gains during the admission.  Patient will also benefit from the coordinated team approach  during an Inpatient Acute Rehabilitation admission.  The patient will receive intensive therapy as well as Rehabilitation physician, nursing, social worker, and care management interventions.  Due to bowel management, bladder management, safety, skin/wound care, disease management, medical administration, pain management, patient education the patient requires 24 hour a day rehabilitation nursing.  The patient is currently mod assist with mobility and basic ADLs.  Discharge setting and therapy post discharge at home with home health is anticipated.  Patient has agreed to participate in the Acute Inpatient Rehabilitation Program and will admit today.  Preadmission Screen Completed By:  Cleatrice Burke, RN MSN 05/17/2018 12:40 PM ______________________________________________________________________   Discussed status with Dr. Naaman Plummer  on  05/17/2018 at  94 and received approval for admission today.  Admission Coordinator:  Cleatrice Burke, RN, time  1478 Date  05/17/2018   Assessment/Plan: Diagnosis: debility after multiple medical 1. Does the need for close, 24 hr/day Medical supervision in concert with the patient's rehab needs make it unreasonable for this patient to be served in a less intensive setting? Yes 2. Co-Morbidities requiring supervision/potential complications: copd, cad, nutrition, htn, afib 3. Due to bladder management, bowel management, safety, skin/wound care, disease management, medication administration, pain management and patient education, does the patient require 24 hr/day rehab nursing? Yes 4. Does the patient require coordinated care of a physician, rehab nurse, PT (1-2 hrs/day, 5 days/week), OT (1-2 hrs/day, 5 days/week) and SLP (1-2 hrs/day, 5 days/week) to address physical and functional deficits in the context of the above medical diagnosis(es)? Yes Addressing deficits in the following areas: balance, endurance, locomotion, strength, transferring,  bowel/bladder control, bathing, dressing, feeding, grooming, toileting, speech, swallowing and psychosocial support 5. Can the patient actively participate in an intensive therapy program of at least 3 hrs of therapy 5 days a week? Yes 6. The potential for patient to make measurable gains while on inpatient rehab is excellent 7. Anticipated functional outcomes upon discharge from inpatients are: modified independent and supervision PT, modified independent and supervision OT, modified independent and supervision SLP 8. Estimated rehab length of stay to reach the above functional goals is: 10-14 days 9. Anticipated D/C setting: Home 10. Anticipated post D/C treatments: [place] therapy 11. Overall Rehab/Functional Prognosis: excellent  MD Signature Meredith Staggers, MD, [place] 05/17/2018

## 2018-05-16 NOTE — Progress Notes (Addendum)
Pulmonary Critical Care Medicine [facility] [place]   PULMONARY CRITICAL CARE SERVICE  PROGRESS NOTE  Date of Service: 05/16/2018  Richard Wang  PFX:[id number]  DOB: [birth year]   DOA: 05/13/2018  Referring Physician: Carron Curie, MD  HPI: Richard Wang is a 73 y.o. male seen for follow up of Acute on Chronic Respiratory Failure.  Patient continues to do well on room air with no acute distress noted at this time.  Medications: Reviewed on Rounds  Physical Exam:  Vitals: Pulse 78 respiration 18 BP 138/73 O2 sat 97% temp 98.0  Ventilator Settings not currently on ventilator  . General: Comfortable at this time . Eyes: Grossly normal lids, irises & conjunctiva . ENT: grossly tongue is normal . Neck: no obvious mass . Cardiovascular: S1 S2 normal no gallop . Respiratory: No rales or rhonchi noted . Abdomen: soft . Skin: no rash seen on limited exam . Musculoskeletal: not rigid . Psychiatric:unable to assess . Neurologic: no seizure no involuntary movements         Lab Data:   Basic Metabolic Panel: Recent Labs  Lab 05/10/18 0624 05/11/18 0401 05/12/18 0515 05/15/18 1007  NA 143  --  143 144  K 3.4* 3.8 3.5 3.6  CL 110  --  109 113*  CO2 24  --  25 23  GLUCOSE 114*  --  93 109*  BUN 40*  --  30* 22  CREATININE 1.10  --  1.03 1.11  CALCIUM 9.0  --  8.8* 8.6*  MG 2.2  --  2.0  --   PHOS 3.6  --  3.4  --     ABG: Recent Labs  Lab 05/12/18 0643  PHART 7.460*  PCO2ART 37.4  PO2ART 145*  HCO3 26.5  O2SAT 99.6    Liver Function Tests: Recent Labs  Lab 05/12/18 0515  ALBUMIN 2.9*   No results for input(s): LIPASE, AMYLASE in the last 168 hours. No results for input(s): AMMONIA in the last 168 hours.  CBC: Recent Labs  Lab 05/10/18 0624 05/12/18 0515  WBC 15.8* 10.3  HGB 8.1* 8.2*  HCT 26.3* 26.7*  MCV 91.6 95.0  PLT 153 156    Cardiac Enzymes: No results for input(s): CKTOTAL, CKMB, CKMBINDEX, TROPONINI in the last 168  hours.  BNP (last 3 results) Recent Labs    04/28/18 1052  BNP 1,239.6*    ProBNP (last 3 results) No results for input(s): PROBNP in the last 8760 hours.  Radiological Exams: No results found.  Assessment/Plan Active Problems:   Acute and chronic respiratory failure with hypoxia (HCC)   Acute systolic heart failure (HCC)   Chronic atrial fibrillation   Acute myocardial infarction, subendocardial infarction, subsequent episode of care (HCC)   Unspecified septicemia(038.9) (HCC)   1. Acute on chronic respiratory failure with hypoxia patient doing well on room air at this time. 2. Severe sepsis resolved 3. Chronic atrial fibrillation rate controlled continue to monitor 4. Non-STEMI resolved at baseline continue supportive care 5. Acute systolic heart failure continue monitor fluid status and diurese as necessary.   I have personally seen and evaluated the patient, evaluated laboratory and imaging results, formulated the assessment and plan and placed orders. The Patient requires high complexity decision making for assessment and support.  Case was discussed on Rounds with the Respiratory Therapy Staff  Yevonne Pax, MD [facility] Pulmonary Critical Care Medicine Sleep Medicine

## 2018-05-17 ENCOUNTER — Encounter (HOSPITAL_COMMUNITY): Payer: Self-pay | Admitting: Nurse Practitioner

## 2018-05-17 ENCOUNTER — Inpatient Hospital Stay (HOSPITAL_COMMUNITY)
Admission: RE | Admit: 2018-05-17 | Discharge: 2018-06-02 | DRG: 945 | Disposition: A | Discharge status: Other Acute Inpatient Hospital | Payer: Medicare Other | Source: Other Acute Inpatient Hospital | Attending: Physical Medicine & Rehabilitation | Admitting: Physical Medicine & Rehabilitation

## 2018-05-17 ENCOUNTER — Other Ambulatory Visit: Payer: Self-pay

## 2018-05-17 DIAGNOSIS — E46 Unspecified protein-calorie malnutrition: Secondary | ICD-10-CM

## 2018-05-17 DIAGNOSIS — R0989 Other specified symptoms and signs involving the circulatory and respiratory systems: Secondary | ICD-10-CM | POA: Diagnosis not present

## 2018-05-17 DIAGNOSIS — D72828 Other elevated white blood cell count: Secondary | ICD-10-CM | POA: Diagnosis not present

## 2018-05-17 DIAGNOSIS — J9620 Acute and chronic respiratory failure, unspecified whether with hypoxia or hypercapnia: Secondary | ICD-10-CM

## 2018-05-17 DIAGNOSIS — R1310 Dysphagia, unspecified: Secondary | ICD-10-CM | POA: Diagnosis present

## 2018-05-17 DIAGNOSIS — E0942 Drug or chemical induced diabetes mellitus with neurological complications with diabetic polyneuropathy: Secondary | ICD-10-CM | POA: Diagnosis present

## 2018-05-17 DIAGNOSIS — Z880 Allergy status to penicillin: Secondary | ICD-10-CM | POA: Diagnosis not present

## 2018-05-17 DIAGNOSIS — R1312 Dysphagia, oropharyngeal phase: Secondary | ICD-10-CM

## 2018-05-17 DIAGNOSIS — G8929 Other chronic pain: Secondary | ICD-10-CM | POA: Diagnosis present

## 2018-05-17 DIAGNOSIS — D649 Anemia, unspecified: Secondary | ICD-10-CM | POA: Diagnosis present

## 2018-05-17 DIAGNOSIS — R350 Frequency of micturition: Secondary | ICD-10-CM | POA: Diagnosis not present

## 2018-05-17 DIAGNOSIS — R5381 Other malaise: Principal | ICD-10-CM | POA: Diagnosis present

## 2018-05-17 DIAGNOSIS — J9621 Acute and chronic respiratory failure with hypoxia: Secondary | ICD-10-CM | POA: Diagnosis present

## 2018-05-17 DIAGNOSIS — E876 Hypokalemia: Secondary | ICD-10-CM | POA: Diagnosis not present

## 2018-05-17 DIAGNOSIS — I482 Chronic atrial fibrillation, unspecified: Secondary | ICD-10-CM | POA: Diagnosis present

## 2018-05-17 DIAGNOSIS — R739 Hyperglycemia, unspecified: Secondary | ICD-10-CM

## 2018-05-17 DIAGNOSIS — I4819 Other persistent atrial fibrillation: Secondary | ICD-10-CM | POA: Diagnosis not present

## 2018-05-17 DIAGNOSIS — R339 Retention of urine, unspecified: Secondary | ICD-10-CM | POA: Diagnosis not present

## 2018-05-17 DIAGNOSIS — M21372 Foot drop, left foot: Secondary | ICD-10-CM | POA: Diagnosis present

## 2018-05-17 DIAGNOSIS — I2510 Atherosclerotic heart disease of native coronary artery without angina pectoris: Secondary | ICD-10-CM | POA: Diagnosis present

## 2018-05-17 DIAGNOSIS — R7309 Other abnormal glucose: Secondary | ICD-10-CM | POA: Diagnosis not present

## 2018-05-17 DIAGNOSIS — E785 Hyperlipidemia, unspecified: Secondary | ICD-10-CM | POA: Diagnosis present

## 2018-05-17 DIAGNOSIS — D62 Acute posthemorrhagic anemia: Secondary | ICD-10-CM | POA: Diagnosis not present

## 2018-05-17 DIAGNOSIS — M545 Low back pain: Secondary | ICD-10-CM | POA: Diagnosis not present

## 2018-05-17 DIAGNOSIS — M21371 Foot drop, right foot: Secondary | ICD-10-CM | POA: Diagnosis not present

## 2018-05-17 DIAGNOSIS — J309 Allergic rhinitis, unspecified: Secondary | ICD-10-CM | POA: Diagnosis present

## 2018-05-17 DIAGNOSIS — Z91041 Radiographic dye allergy status: Secondary | ICD-10-CM | POA: Diagnosis not present

## 2018-05-17 DIAGNOSIS — Z8249 Family history of ischemic heart disease and other diseases of the circulatory system: Secondary | ICD-10-CM | POA: Diagnosis not present

## 2018-05-17 DIAGNOSIS — E8809 Other disorders of plasma-protein metabolism, not elsewhere classified: Secondary | ICD-10-CM | POA: Diagnosis not present

## 2018-05-17 DIAGNOSIS — D72829 Elevated white blood cell count, unspecified: Secondary | ICD-10-CM

## 2018-05-17 DIAGNOSIS — T380X5A Adverse effect of glucocorticoids and synthetic analogues, initial encounter: Secondary | ICD-10-CM | POA: Diagnosis present

## 2018-05-17 DIAGNOSIS — J449 Chronic obstructive pulmonary disease, unspecified: Secondary | ICD-10-CM | POA: Diagnosis present

## 2018-05-17 DIAGNOSIS — R079 Chest pain, unspecified: Secondary | ICD-10-CM | POA: Diagnosis not present

## 2018-05-17 DIAGNOSIS — I272 Pulmonary hypertension, unspecified: Secondary | ICD-10-CM | POA: Diagnosis present

## 2018-05-17 DIAGNOSIS — J9610 Chronic respiratory failure, unspecified whether with hypoxia or hypercapnia: Secondary | ICD-10-CM | POA: Diagnosis not present

## 2018-05-17 DIAGNOSIS — I10 Essential (primary) hypertension: Secondary | ICD-10-CM | POA: Diagnosis not present

## 2018-05-17 DIAGNOSIS — Z87891 Personal history of nicotine dependence: Secondary | ICD-10-CM

## 2018-05-17 DIAGNOSIS — I252 Old myocardial infarction: Secondary | ICD-10-CM | POA: Diagnosis not present

## 2018-05-17 DIAGNOSIS — I4891 Unspecified atrial fibrillation: Secondary | ICD-10-CM | POA: Diagnosis not present

## 2018-05-17 DIAGNOSIS — E1142 Type 2 diabetes mellitus with diabetic polyneuropathy: Secondary | ICD-10-CM | POA: Diagnosis not present

## 2018-05-17 HISTORY — DX: Unspecified atrial fibrillation: I48.91

## 2018-05-17 HISTORY — DX: Essential (primary) hypertension: I10

## 2018-05-17 HISTORY — DX: Type 2 diabetes mellitus without complications: E11.9

## 2018-05-17 HISTORY — DX: Crohn's disease of large intestine without complications: K50.10

## 2018-05-17 LAB — CREATININE, SERUM
Creatinine, Ser: 1.29 mg/dL — ABNORMAL HIGH (ref 0.61–1.24)
GFR, EST NON AFRICAN AMERICAN: 55 mL/min — AB (ref >60)

## 2018-05-17 LAB — CBC
HCT: 27.2 % — ABNORMAL LOW (ref 39.0–52.0)
HEMOGLOBIN: 8.1 g/dL — AB (ref 13.0–17.0)
MCH: 27.9 pg (ref 26.0–34.0)
MCHC: 29.8 g/dL — ABNORMAL LOW (ref 30.0–36.0)
MCV: 93.8 fL (ref 80.0–100.0)
Platelets: 187 10*3/uL (ref 150–400)
RBC: 2.9 MIL/uL — ABNORMAL LOW (ref 4.22–5.81)
RDW: 17.2 % — ABNORMAL HIGH (ref 11.5–15.5)
WBC: 10.7 10*3/uL — AB (ref 4.0–10.5)
nRBC: 0 % (ref 0.0–0.2)

## 2018-05-17 LAB — MRSA PCR SCREENING: MRSA by PCR: NEGATIVE

## 2018-05-17 LAB — GLUCOSE, CAPILLARY: Glucose-Capillary: 142 mg/dL — ABNORMAL HIGH (ref 70–99)

## 2018-05-17 MED ORDER — PREDNISONE 20 MG PO TABS
40.0000 mg | ORAL_TABLET | Freq: Every day | ORAL | Status: DC
  Administered 2018-05-18 – 2018-05-21 (×4): 40 mg via ORAL
  Filled 2018-05-17 (×4): qty 2

## 2018-05-17 MED ORDER — SCOPOLAMINE 1 MG/3DAYS TD PT72
1.0000 | MEDICATED_PATCH | TRANSDERMAL | Status: DC
  Administered 2018-05-17 – 2018-06-01 (×6): 1.5 mg via TRANSDERMAL
  Filled 2018-05-17 (×6): qty 1

## 2018-05-17 MED ORDER — SODIUM CHLORIDE 0.45 % IV SOLN: INTRAVENOUS | Status: DC

## 2018-05-17 MED ORDER — FOLIC ACID 1 MG PO TABS
1.0000 mg | ORAL_TABLET | Freq: Every day | ORAL | Status: DC
  Administered 2018-05-18 – 2018-06-02 (×16): 1 mg via ORAL
  Filled 2018-05-17 (×16): qty 1

## 2018-05-17 MED ORDER — DILTIAZEM HCL 60 MG PO TABS
30.0000 mg | ORAL_TABLET | Freq: Two times a day (BID) | ORAL | Status: DC
  Administered 2018-05-17 – 2018-06-02 (×32): 30 mg via ORAL
  Filled 2018-05-17 (×32): qty 1

## 2018-05-17 MED ORDER — TAMSULOSIN HCL 0.4 MG PO CAPS
0.4000 mg | ORAL_CAPSULE | Freq: Every day | ORAL | Status: DC
  Administered 2018-05-18 – 2018-05-21 (×4): 0.4 mg via ORAL
  Filled 2018-05-17 (×5): qty 1

## 2018-05-17 MED ORDER — ACETAMINOPHEN 500 MG PO TABS
1000.0000 mg | ORAL_TABLET | Freq: Four times a day (QID) | ORAL | Status: DC | PRN
  Administered 2018-05-17 – 2018-06-02 (×17): 1000 mg via ORAL
  Filled 2018-05-17 (×17): qty 2

## 2018-05-17 MED ORDER — SENNOSIDES-DOCUSATE SODIUM 8.6-50 MG PO TABS
1.0000 | ORAL_TABLET | Freq: Two times a day (BID) | ORAL | Status: DC
  Administered 2018-05-17 – 2018-06-01 (×24): 1 via ORAL
  Filled 2018-05-17 (×32): qty 1

## 2018-05-17 MED ORDER — PANTOPRAZOLE SODIUM 40 MG PO TBEC
40.0000 mg | DELAYED_RELEASE_TABLET | Freq: Two times a day (BID) | ORAL | Status: DC
  Administered 2018-05-17 – 2018-06-02 (×32): 40 mg via ORAL
  Filled 2018-05-17 (×32): qty 1

## 2018-05-17 MED ORDER — ATORVASTATIN CALCIUM 40 MG PO TABS
40.0000 mg | ORAL_TABLET | Freq: Every day | ORAL | Status: DC
  Administered 2018-05-17 – 2018-06-01 (×16): 40 mg via ORAL
  Filled 2018-05-17 (×16): qty 1

## 2018-05-17 MED ORDER — METOPROLOL TARTRATE 12.5 MG HALF TABLET
12.5000 mg | ORAL_TABLET | Freq: Two times a day (BID) | ORAL | Status: DC
  Administered 2018-05-17 – 2018-06-02 (×32): 12.5 mg via ORAL
  Filled 2018-05-17 (×33): qty 1

## 2018-05-17 MED ORDER — HEPARIN SODIUM (PORCINE) 5000 UNIT/ML IJ SOLN: 5000.0000 [IU] | Freq: Three times a day (TID) | INTRAMUSCULAR | Status: DC

## 2018-05-17 MED ORDER — VITAMIN C 500 MG PO TABS
500.0000 mg | ORAL_TABLET | Freq: Every day | ORAL | Status: DC
  Administered 2018-05-18 – 2018-06-02 (×16): 500 mg via ORAL
  Filled 2018-05-17 (×15): qty 1

## 2018-05-17 MED ORDER — ACETAMINOPHEN 500 MG PO TABS: 500.0000 mg | ORAL_TABLET | Freq: Four times a day (QID) | ORAL | Status: DC | PRN

## 2018-05-17 MED ORDER — INSULIN GLARGINE 100 UNIT/ML ~~LOC~~ SOLN
7.0000 [IU] | Freq: Every day | SUBCUTANEOUS | Status: DC
  Administered 2018-05-17 – 2018-06-02 (×16): 7 [IU] via SUBCUTANEOUS
  Filled 2018-05-17 (×18): qty 0.07

## 2018-05-17 MED ORDER — ASPIRIN EC 81 MG PO TBEC
81.0000 mg | DELAYED_RELEASE_TABLET | Freq: Every day | ORAL | Status: DC
  Administered 2018-05-18 – 2018-05-19 (×2): 81 mg via ORAL
  Filled 2018-05-17 (×3): qty 1

## 2018-05-17 MED ORDER — HYDRALAZINE HCL 10 MG PO TABS
20.0000 mg | ORAL_TABLET | Freq: Three times a day (TID) | ORAL | Status: DC
  Administered 2018-05-17 – 2018-06-02 (×48): 20 mg via ORAL
  Filled 2018-05-17 (×48): qty 2

## 2018-05-17 MED ORDER — ALBUTEROL SULFATE (2.5 MG/3ML) 0.083% IN NEBU: 2.5000 mg | INHALATION_SOLUTION | RESPIRATORY_TRACT | Status: DC | PRN

## 2018-05-17 MED ORDER — AMLODIPINE BESYLATE 5 MG PO TABS
5.0000 mg | ORAL_TABLET | Freq: Every day | ORAL | Status: DC
  Administered 2018-05-18 – 2018-05-28 (×11): 5 mg via ORAL
  Filled 2018-05-17 (×11): qty 1

## 2018-05-17 MED ORDER — HEPARIN SODIUM (PORCINE) 5000 UNIT/ML IJ SOLN
5000.0000 [IU] | Freq: Three times a day (TID) | INTRAMUSCULAR | Status: DC
  Administered 2018-05-17 – 2018-06-02 (×48): 5000 [IU] via SUBCUTANEOUS
  Filled 2018-05-17 (×48): qty 1

## 2018-05-17 MED ORDER — VITAMIN B-1 100 MG PO TABS
100.0000 mg | ORAL_TABLET | Freq: Every day | ORAL | Status: DC
  Administered 2018-05-18 – 2018-06-02 (×16): 100 mg via ORAL
  Filled 2018-05-17 (×16): qty 1

## 2018-05-17 NOTE — Progress Notes (Signed)
Meredith Staggers, MD  Physician  Physical Medicine and Rehabilitation  PMR Pre-admission  Signed  Date of Service:  05/16/2018 5:33 PM       Related encounter: Admission (Discharged) from 05/13/2018 in [place] all [x]Manual[x]Template[x]Copied  Added by: [x]Lusia Greis, Vertis Kelch, RN[x]Swartz, Celesta Gentile, MD  []Hover for details PMR Admission Coordinator Pre-Admission Assessment  Patient: Richard Wang is an 73 y.o., male MRN: [id number] DOB: 11-02-1945 Height:   Weight:    Insurance Information HMO:     PPO:      PCP:      IPA:      80/20:      OTHER: no HMO PRIMARY: Medicare a and b      Policy#: [id number]      Subscriber: pt Benefits:  Phone #: passport one online     Name: 05/15/2018 Eff. Date: 08/21/2011     Deduct: $1408      Out of Pocket Max: none      Life Max: none CIR: 100%      SNF: 20 full days Outpatient: 80%     Co-Pay: 20% Home Health: 100%      Co-Pay: none DME: 80%     Co-Pay: 20% Providers: pt choice  SECONDARY: none       Patient has left 34 full medicare days, 20 co pay days and 60 lifetime reserve days left  Medicaid Application Date:       Case Manager:  Disability Application Date:       Case Worker:   Emergency [employer] copy Information    Name Relation Home Work [place] Niece   [phone]   Onnie Graham Sister   [phone]      Current Medical History  Patient Admitting Diagnosis: Debility, respiratory Failure  History of Present Illness: Richard Wang is a 73 year old right-handed male with history of COPD with remote tobacco abuse, CAD with non-STEMI maintained on aspirin, renal insufficiency, atrial fibrillation and hypertension. Initially presented to [facility] with increasing shortness of breath and lower extremity edema. Chest x-ray and echocardiogram showed moderate LV systolic dysfunction and moderate to  severe MR and moderate pulmonary systolic hypertension consistent with CHF. Patient started on BiPAP however deteriorated and ended up having to be intubated placed on ventilator. Patient subsequently complications associated with worsening renal function, congestive heart failure. Attempt at extubation failed and patient was transferred to [facility] 04/26/2018 for ventilation wean. Initially on IV Solu-Medrol and transitioned to by mouth prednisone after ventilation discontinued. Tracheostomy tube was not needed. Patient later extubated successfully and monitored. Currently maintained on prednisone 40 mg daily with slow taper. Subcutaneous heparin for DVT prophylaxis. Patient remained on aspirin therapy as well as Cardizem for history of atrial fibrillation. Swallow study completed maintained on a dysphagia #2 running thick liquid diet.   Patient's medical record from [facility]  has been reviewed by the rehabilitation admission coordinator and physician.  Past Medical History      Past Medical History:  Diagnosis Date  . Acute and chronic respiratory failure with hypoxia ([place])   . Acute myocardial infarction, subendocardial infarction, subsequent episode of care ([place])   . Acute systolic heart failure ([place])   . Chronic atrial fibrillation   . Sepsis with acute organ dysfunction ([place])   . Unspecified septicemia(038.9) ([place])  Family History   family history is not on file.  Prior Rehab/Hospitalizations Has the patient had prior rehab or hospitalizations prior to admission? Yes  Has the patient had major surgery during 100 days prior to admission? No              Current Medications See MAR  Patients Current Diet:     Diet Order                  DIET DYS 2 Room service appropriate? Yes with Assist; Fluid consistency: Honey Thick  Diet effective now               Precautions / Restrictions Precautions Precautions: Fall    Has the patient had 2 or more falls or a fall with injury in the past year? No  Prior Activity Level Limited Community (1-2x/wk): Indepndent without AD walked short distances 50 feet before becoming SOB; drove  Prior Functional Level Self Care: Did the patient need help bathing, dressing, using the toilet or eating? Independent  Indoor Mobility: Did the patient need assistance with walking from room to room (with or without device)? Independent  Stairs: Did the patient need assistance with internal or external stairs (with or without device)? Independent  Functional Cognition: Did the patient need help planning regular tasks such as shopping or remembering to take medications? Independent  Home Assistive Devices / Equipment Home Assistive Devices/Equipment: None  Prior Device Use: Indicate devices/aids used by the patient prior to current illness, exacerbation or injury? None of the above   Prior Functional Level Current Functional Level  Bed Mobility  Independent  Min assist   Transfers  Independent  Mod assist   Mobility - Walk/Wheelchair  Independent(became SOB about 50 feet pta)  Mod assist bed to chair only   Upper Body Dressing  Independent  Min assist   Lower Body Dressing  Independent  Mod assist   Grooming  Independent  Min assist   Eating/Drinking  Independent  Min assist   Toilet Transfer  Independent  Mod assist   Bladder Continence   continent  indwelling catheter   Bowel Management  continent  continent   Stair Climbing   Independent  Other   Communication  independent  indpendent   Memory  intact some confusion per niece since admisison but clearing   Driving   yes      Special needs/care consideration BiPAP/CPAP  N/a CPM  N/a Continuous Drip IV  N/a Dialysis n/a Life Vest  N/a Oxygen  Room air Special Bed  N/a Trach Size  N/a Wound Vac n/a Skin Bilateral heels  reddened Bowel mgmt:  continent Bladder mgmt:  Indwelling catheter Diabetic mgmt:  N/a Behavioral consideration some mild disorientation to situation and series of events leading to admission Chemo/radiation n/a   Previous Home Environment  Living Arrangements: Other relatives(lives with sister, brother in law and a niece with old TBI)  Lives With: Family Available Help at Discharge: Family, Available 24 hours/day Type of Home: House Home Layout: One level Bathroom Toilet: Standard Bathroom Accessibility: Yes How Accessible: Accessible via walker Home Care Services: No  Discharge Living Setting Plans for Discharge Living Setting: Lives with (comment)(sister, and her family for 4 years) Type of Home at Discharge: House Discharge Home Layout: One level Discharge Bathroom Shower/Tub: Tub/shower unit Discharge Bathroom Toilet: Standard Discharge Bathroom Accessibility: Yes How Accessible: Accessible via walker Does the patient have any problems obtaining your medications?: No  Social/Family/Support Systems Contact Information: niece,  Richarda Blade Anticipated Caregiver: niece and sister Anticipated Caregiver's Contact Information: [phone] Ability/Limitations of Caregiver: this neice does not live with pt and her parents Caregiver Availability: 24/7 Discharge Plan Discussed with Primary Caregiver: Yes Is Caregiver In Agreement with Plan?: Yes Does Caregiver/Family have Issues with Lodging/Transportation while Pt is in Rehab?: No  Niece, Andee Poles, does not live with her parents and Uncle. Another niece lives with patient who has an old TBI.  Goals/Additional Needs Patient/Family Goal for Rehab: Mod I to superivsion PT, OT, and SLP Expected length of stay: ELOS 10 to 14 days Pt/Family Agrees to Admission and willing to participate: Yes Program Orientation Provided & Reviewed with Pt/Caregiver Including Roles  & Responsibilities: Yes  Decrease burden of Care through  IP rehab admission: n/a  Possible need for SNF placement upon discharge: not anticipated  Patient Condition: I have reviewed medical records from [place], spoken with CSW, patient, and niece. I met with patient at the bedside for inpatient rehabilitation assessment.  Patient will benefit from ongoing PT, OT, and SLP), can actively participate in 3 hours of therapy a day 5 days of the week, and can make measurable gains during the admission.  Patient will also benefit from the coordinated team approach during an Inpatient Acute Rehabilitation admission.  The patient will receive intensive therapy as well as Rehabilitation physician, nursing, social worker, and care management interventions.  Due to bowel management, bladder management, safety, skin/wound care, disease management, medical administration, pain management, patient education the patient requires 24 hour a day rehabilitation nursing.  The patient is currently mod assist with mobility and basic ADLs.  Discharge setting and therapy post discharge at home with home health is anticipated.  Patient has agreed to participate in the Acute Inpatient Rehabilitation Program and will admit today.  Preadmission Screen Completed By:  Cleatrice Burke, RN MSN 05/17/2018 12:40 PM ______________________________________________________________________   Discussed status with Dr. Naaman Plummer  on  05/17/2018 at  61 and received approval for admission today.  Admission Coordinator:  Cleatrice Burke, RN, time  1224 Date  05/17/2018   Assessment/Plan: Diagnosis: debility after multiple medical 1. Does the need for close, 24 hr/day Medical supervision in concert with the patient's rehab needs make it unreasonable for this patient to be served in a less intensive setting? Yes 2. Co-Morbidities requiring supervision/potential complications: copd, cad, nutrition, htn, afib 3. Due to bladder management, bowel management, safety,  skin/wound care, disease management, medication administration, pain management and patient education, does the patient require 24 hr/day rehab nursing? Yes 4. Does the patient require coordinated care of a physician, rehab nurse, PT (1-2 hrs/day, 5 days/week), OT (1-2 hrs/day, 5 days/week) and SLP (1-2 hrs/day, 5 days/week) to address physical and functional deficits in the context of the above medical diagnosis(es)? Yes Addressing deficits in the following areas: balance, endurance, locomotion, strength, transferring, bowel/bladder control, bathing, dressing, feeding, grooming, toileting, speech, swallowing and psychosocial support 5. Can the patient actively participate in an intensive therapy program of at least 3 hrs of therapy 5 days a week? Yes 6. The potential for patient to make measurable gains while on inpatient rehab is excellent 7. Anticipated functional outcomes upon discharge from inpatients are: modified independent and supervision PT, modified independent and supervision OT, modified independent and supervision SLP 8. Estimated rehab length of stay to reach the above functional goals is: 10-14 days 9. Anticipated D/C setting: Home 10. Anticipated post D/C treatments: [place] therapy 11. Overall Rehab/Functional Prognosis: excellent  MD Signature Richard Wang  Alen Blew, MD, [place] Physical [facility] & Rehabilitation 05/17/2018         Revision History

## 2018-05-17 NOTE — H&P (Signed)
Physical Medicine and Rehabilitation Admission H&P     Chief complaint: weakness   HPI: Richard Wang is a 73 year old right-handed male with history of COPD with remote tobacco abuse, CAD with non-STEMI maintained on aspirin, renal insufficiency, atrial fibrillation and hypertension. Initially presented to [facility] with increasing shortness of breath and lower extremity edema. Chest x-ray and echocardiogram showed moderate LV systolic dysfunction and moderate to severe MR and moderate pulmonary systolic hypertension consistent with CHF. Patient started on BiPAP however deteriorated and ended up having to be intubated placed on ventilator. Patient subsequently complications associated with worsening renal function, congestive heart failure. Attempt at extubation failed and patient was transferred to [facility] 04/26/2018 for ventilation wean. Initially on IV Solu-Medrol and transitioned to by mouth prednisone after ventilation discontinued. Tracheostomy tube was not needed. Patient later extubated successfully and monitored. Currently maintained on prednisone 40 mg daily with slow taper. Subcutaneous heparin for DVT prophylaxis. Patient remained on aspirin therapy as well as Cardizem for history of atrial fibrillation. Swallow study completed maintained on a dysphagia #2 pudding thick liquid diet. Therapy evaluations ongoing slow but progressive gains and patient was admitted for a comprehensive rehabilitation program   Review of Systems  Constitutional: Negative for chills and fever.  HENT: Negative for hearing loss.   Eyes: Negative for blurred vision and double vision.  Respiratory: Positive for cough, shortness of breath and wheezing.   Cardiovascular: Positive for palpitations and leg swelling. Negative for chest pain.  Gastrointestinal: Positive for constipation. Negative for heartburn, nausea and vomiting.  Genitourinary: Positive for urgency. Negative for  dysuria, flank pain and hematuria.  Musculoskeletal: Positive for joint pain and myalgias.  Skin: Negative for rash.  Neurological: Negative for seizures.  All other systems reviewed and are negative.   Past Medical History:  Diagnosis Date  . Acute and chronic respiratory failure with hypoxia (H[facility])    . Acute myocardial infarction, subendocardial infarction, subsequent episode of care ([facility])    . Acute systolic heart failure ([facility])    . Chronic atrial fibrillation    . Sepsis with acute organ dysfunction ([facility])    . Unspecified septicemia(038.9) ([facility])        Family history. Positive for macular degeneration. Father with hypertension and CAD. Mother with history of hypertension. Denies any diabetes   Social History: she quit smoking 1969 Allergies: penicillin contrast allergy pre-med pack Medications prior to admission. Not listed   Drug Regimen Review Drug regimen was reviewed and remains appropriate with no significant issues identified Home: Home Living Living Arrangements: Other relatives(lives with sister, brother in law and a niece with old TBI) Available Help at Discharge: Family, Available 24 hours/day Type of Home: House Home Layout: One level Bathroom Toilet: Standard Bathroom Accessibility: Yes  Lives With: Family   Functional History/social history: patient lives with wife independent prior to admission     Functional Status:  Mobility:  min to mod assist mobility ambulating 50 feet   ADL: min mod assist upper and lower body ADLs   Cognition:WFL   Physical Exam: There were no vitals taken for this visit. Physical Exam  Constitutional: He is oriented to person, place, and time.  Frail appearing.   HENT:  Head: Normocephalic and atraumatic.  Eyes: Pupils are equal, round, and reactive to light. EOM are normal.  Neck: Normal range of motion. No tracheal deviation present. No thyromegaly present.  Cardiovascular: Normal rate.  Neurological: He is alert and  oriented to  person, place, and time.  Patient is alert sitting up in bed in no acute distress making good eye contact with examiner. Follows full commands. He was not able to recall his full hospital course. UE 3-4/5. RLE: 3/5 HF, KE and 4/5 ADF/PF. LLE: 3/5 HF,KE, 4/5APF, trace ADF. Sensory decreased in stocking glove distribution up to both knees.   Skin: Skin is warm and dry.  Psychiatric:  Pleasant and cooperative                  Medical Problem List and Plan: 1.  Debility secondary to acute on chronic respiratory failure. Pt also with peripheral neuropathy and left sided foot drop. History of low back pain, ?stenosis per pt report. I could not find any imaging or records pertaining to his back however. Pt states he had foot drop prior to the admission             -admit to inpatient rehab             -PRAFO LLE, likely will need AFO             -Consider further work up of low back/ lower extremity sensory loss/weakness 2.  Antithrombotics: -DVT/anticoagulation:  Subcutaneous heparin             -antiplatelet therapy:  Aspirin 81 mg daily 3. Pain Management: Tylenol as needed 4. Mood:  Provide emotional support             -antipsychotic agents: N/A 5. Neuropsych: This patient is capable of making decisions on his own behalf. 6. Skin/Wound Care: routine skin checks 7. Fluids/Electrolytes/Nutrition: routine I's and O's  with follow-up chemistries requested.              -on D2 with honey liquids currently             -supplement with HS IVF 1/2ns 50cc/hr 8.atrial fibrillation. Cardizem 30 mg twice a day, metoprolol 12.5 mg twice a day. Cardiac rate controlled at present 9. Hypertension. Hydralazine 20 mg every 8 hours, Norvasc 5 mg daily 10. Hyperglycemia. Lantus insulin 70 units daily. Monitor with decrease prednisone 11. Hyperlipidemia. Lipitor 12. Acute on chronic respiratory failure:             -slow prednisone taper             -oxygen as needed      Post Admission  Physician Evaluation: 1. Functional deficits secondary  to debility . 2. Patient is admitted to receive collaborative, interdisciplinary care between the physiatrist, rehab nursing staff, and therapy team. 3. Patient's level of medical complexity and substantial therapy needs in context of that medical necessity cannot be provided at a lesser intensity of care such as a SNF. 4. Patient has experienced substantial functional loss from his/her baseline which was documented above under the "Functional History" and "Functional Status" headings.  Judging by the patient's diagnosis, physical exam, and functional history, the patient has potential for functional progress which will result in measurable gains while on inpatient rehab.  These gains will be of substantial and practical use upon discharge  in facilitating mobility and self-care at the household level. 5. Physiatrist will provide 24 hour management of medical needs as well as oversight of the therapy plan/treatment and provide guidance as appropriate regarding the interaction of the two. 6. The Preadmission Screening has been reviewed and patient status is unchanged unless otherwise stated above. 7. 24 hour rehab nursing will assist with bladder management, bowel management,  safety, skin/wound care, disease management, medication administration, pain management and patient education  and help integrate therapy concepts, techniques,education, etc. 8. PT will assess and treat for/with: Lower extremity strength, range of motion, stamina, balance, functional mobility, safety, adaptive techniques and equipment, NMR, orthotics, pain mgt, family ed.   Goals are: mod I to supervision. 9. OT will assess and treat for/with: ADL's, functional mobility, safety, upper extremity strength, adaptive techniques and equipment, NMR, family education.   Goals are: mod I to supervision. Therapy may proceed with showering this patient. 10. SLP will assess and treat  for/with: swallowing, communication.  Goals are: mod I to supervision. 11. Case Management and Social Worker will assess and treat for psychological issues and discharge planning. 12. Team conference will be held weekly to assess progress toward goals and to determine barriers to discharge. 13. Patient will receive at least 3 hours of therapy per day at least 5 days per week. 14. ELOS: 10-14 days       15. Prognosis:  excellent   I have personally performed a face to face diagnostic evaluation of this patient and formulated the key components of the plan.  Additionally, I have personally reviewed laboratory data, imaging studies, as well as relevant notes and concur with the physician assistant's documentation above.  Ranelle Oyster, MD, FAAPMR     Mcarthur Rossetti Angiulli, PA-C 05/17/2018

## 2018-05-17 NOTE — Progress Notes (Addendum)
Pulmonary Critical Care Medicine [facility] [place]   PULMONARY CRITICAL CARE SERVICE  PROGRESS NOTE  Date of Service: 05/17/2018  Richard Wang  NMM:[id number]  DOB: May 12, 1945   DOA: 05/17/2018  Referring Physician: Carron Curie, MD  HPI: Richard Wang is a 73 y.o. male seen for follow up of Acute on Chronic Respiratory Failure.  Patient continues to do well on room air with no acute distress noted at this time.  Medications: Reviewed on Rounds  Physical Exam:  Vitals: Pulse 70 respirations 15 BP 152/83 O2 sat 99% MI 7.4  Ventilator Settings not currently later  . General: Comfortable at this time . Eyes: Grossly normal lids, irises & conjunctiva . ENT: grossly tongue is normal . Neck: no obvious mass . Cardiovascular: S1 S2 normal no gallop . Respiratory: No rales or rhonchi noted . Abdomen: soft . Skin: no rash seen on limited exam . Musculoskeletal: not rigid . Psychiatric:unable to assess . Neurologic: no seizure no involuntary movements         Lab Data:   Basic Metabolic Panel: Recent Labs  Lab 05/11/18 0401 05/12/18 0515 05/15/18 1007  NA  --  143 144  K 3.8 3.5 3.6  CL  --  109 113*  CO2  --  25 23  GLUCOSE  --  93 109*  BUN  --  30* 22  CREATININE  --  1.03 1.11  CALCIUM  --  8.8* 8.6*  MG  --  2.0  --   PHOS  --  3.4  --     ABG: Recent Labs  Lab 05/12/18 0643  PHART 7.460*  PCO2ART 37.4  PO2ART 145*  HCO3 26.5  O2SAT 99.6    Liver Function Tests: Recent Labs  Lab 05/12/18 0515  ALBUMIN 2.9*   No results for input(s): LIPASE, AMYLASE in the last 168 hours. No results for input(s): AMMONIA in the last 168 hours.  CBC: Recent Labs  Lab 05/12/18 0515  WBC 10.3  HGB 8.2*  HCT 26.7*  MCV 95.0  PLT 156    Cardiac Enzymes: No results for input(s): CKTOTAL, CKMB, CKMBINDEX, TROPONINI in the last 168 hours.  BNP (last 3 results) Recent Labs    04/28/18 1052  BNP 1,239.6*    ProBNP (last 3 results) No  results for input(s): PROBNP in the last 8760 hours.  Radiological Exams: No results found.  Assessment/Plan Active Problems:   Debility   1. Acute on chronic respiratory failure with hypoxia patient doing well on room air at this time will likely be discharged. 2. Severe sepsis resolved 3. Chronic A. fib relation rate controlled continue to monitor 4. Non-STEMI resolved at baseline supportive care 5. Acute systolic heart failure continue to monitor fluid status and reassess necessary   I have personally seen and evaluated the patient, evaluated laboratory and imaging results, formulated the assessment and plan and placed orders. The Patient requires high complexity decision making for assessment and support.  Case was discussed on Rounds with the Respiratory Therapy Staff  Yevonne Pax, MD [facility] Pulmonary Critical Care Medicine Sleep Medicine

## 2018-05-17 NOTE — H&P (Signed)
Physical Medicine and Rehabilitation Admission H&P    Chief complaint: weakness  HPI: Richard Wang is a 73 year old right-handed male with history of COPD with remote tobacco abuse, CAD with non-STEMI maintained on aspirin, renal insufficiency, atrial fibrillation and hypertension. Initially presented to [facility] with increasing shortness of breath and lower extremity edema. Chest x-ray and echocardiogram showed moderate LV systolic dysfunction and moderate to severe MR and moderate pulmonary systolic hypertension consistent with CHF. Patient started on BiPAP however deteriorated and ended up having to be intubated placed on ventilator. Patient subsequently complications associated with worsening renal function, congestive heart failure. Attempt at extubation failed and patient was transferred to [facility] 04/26/2018 for ventilation wean. Initially on IV Solu-Medrol and transitioned to by mouth prednisone after ventilation discontinued. Tracheostomy tube was not needed. Patient later extubated successfully and monitored. Currently maintained on prednisone 40 mg daily with slow taper. Subcutaneous heparin for DVT prophylaxis. Patient remained on aspirin therapy as well as Cardizem for history of atrial fibrillation. Swallow study completed maintained on a dysphagia #2 pudding thick liquid diet. Therapy evaluations ongoing slow but progressive gains and patient was admitted for a comprehensive rehabilitation program  Review of Systems  Constitutional: Negative for chills and fever.  HENT: Negative for hearing loss.   Eyes: Negative for blurred vision and double vision.  Respiratory: Positive for cough, shortness of breath and wheezing.   Cardiovascular: Positive for palpitations and leg swelling. Negative for chest pain.  Gastrointestinal: Positive for constipation. Negative for heartburn, nausea and vomiting.  Genitourinary: Positive for urgency. Negative for dysuria,  flank pain and hematuria.  Musculoskeletal: Positive for joint pain and myalgias.  Skin: Negative for rash.  Neurological: Negative for seizures.  All other systems reviewed and are negative.  Past Medical History:  Diagnosis Date  . Acute and chronic respiratory failure with hypoxia (HCC)   . Acute myocardial infarction, subendocardial infarction, subsequent episode of care (HCC)   . Acute systolic heart failure (HCC)   . Chronic atrial fibrillation   . Sepsis with acute organ dysfunction (HCC)   . Unspecified septicemia(038.9) (HCC)     Family history. Positive for macular degeneration. Father with hypertension and CAD. Mother with history of hypertension. Denies any diabetes  Social History: she quit smoking 1969 Allergies: penicillin contrast allergy pre-med pack Medications prior to admission. Not listed  Drug Regimen Review Drug regimen was reviewed and remains appropriate with no significant issues identified Home: Home Living Living Arrangements: Other relatives(lives with sister, brother in law and a niece with old TBI) Available Help at Discharge: Family, Available 24 hours/day Type of Home: House Home Layout: One level Bathroom Toilet: Standard Bathroom Accessibility: Yes  Lives With: Family   Functional History/social history: patient lives with wife independent prior to admission   Functional Status:  Mobility:  min to mod assist mobility ambulating 50 feet        ADL: min mod assist upper and lower body ADLs    Cognition:WFL      Physical Exam: There were no vitals taken for this visit. Physical Exam  Constitutional: He is oriented to person, place, and time.  Frail appearing.   HENT:  Head: Normocephalic and atraumatic.  Eyes: Pupils are equal, round, and reactive to light. EOM are normal.  Neck: Normal range of motion. No tracheal deviation present. No thyromegaly present.  Cardiovascular: Normal rate.  Neurological: He is alert and  oriented to person, place, and time.  Patient is alert sitting  up in bed in no acute distress making good eye contact with examiner. Follows full commands. He was not able to recall his full hospital course. UE 3-4/5. RLE: 3/5 HF, KE and 4/5 ADF/PF. LLE: 3/5 HF,KE, 4/5APF, trace ADF. Sensory decreased in stocking glove distribution up to both knees.   Skin: Skin is warm and dry.  Psychiatric:  Pleasant and cooperative          Medical Problem List and Plan: 1.  Debility secondary to acute on chronic respiratory failure. Pt also with peripheral neuropathy and left sided foot drop. ? History of lumbar stenosis with stenosis   -admit to inpatient rehab  -PRAFO LLE, likely will need AFO  -Consider further work up of lower extremity sensory loss/weakness 2.  Antithrombotics: -DVT/anticoagulation:  Subcutaneous heparin  -antiplatelet therapy:  Aspirin 81 mg daily 3. Pain Management: Tylenol as needed 4. Mood:  Provide emotional support  -antipsychotic agents: N/A 5. Neuropsych: This patient is capable of making decisions on his own behalf. 6. Skin/Wound Care: routine skin checks 7. Fluids/Electrolytes/Nutrition: routine I's and O's  with follow-up chemistries requested.   -on D2 puddings currently  -supplement with HS IVF 8.atrial fibrillation. Cardizem 30 mg twice a day, metoprolol 12.5 mg twice a day. Cardiac rate controlled at present 9. Hypertension. Hydralazine 20 mg every 8 hours, Norvasc 5 mg daily 10. Hyperglycemia. Lantus insulin 70 units daily. Monitor with decrease prednisone 11. Hyperlipidemia. Lipitor 12. Acute on chronic respiratory failure:  -slow prednisone taper  -oxygen      Charlton Amor, PA-C 05/17/2018

## 2018-05-17 NOTE — Progress Notes (Signed)
Patient c/o pain to back and LE'S ,MD on call notified, assisted with repositioning for comfort/

## 2018-05-17 NOTE — Progress Notes (Signed)
Patient ID: MATH FINCK, male   DOB: [birth year], 73 y.o.   MRN: [id number] Patient admitted to 4M04 via wheelchair, escorted by nursing staff.  Patient verbalized understanding of rehab process, specifically our fall prevention policy, our current visitation policy, and personal belongings policy.  Patient appears to be in no immediate distress at this time.  Dani Gobble, RN

## 2018-05-18 ENCOUNTER — Inpatient Hospital Stay (HOSPITAL_COMMUNITY): Payer: Medicare Other | Admitting: Physical Therapy

## 2018-05-18 ENCOUNTER — Inpatient Hospital Stay (HOSPITAL_COMMUNITY): Payer: Medicare Other

## 2018-05-18 ENCOUNTER — Inpatient Hospital Stay (HOSPITAL_COMMUNITY): Payer: Medicare Other | Admitting: Occupational Therapy

## 2018-05-18 DIAGNOSIS — G8929 Other chronic pain: Secondary | ICD-10-CM

## 2018-05-18 DIAGNOSIS — M21372 Foot drop, left foot: Secondary | ICD-10-CM

## 2018-05-18 DIAGNOSIS — M545 Low back pain: Secondary | ICD-10-CM

## 2018-05-18 DIAGNOSIS — M21371 Foot drop, right foot: Secondary | ICD-10-CM

## 2018-05-18 DIAGNOSIS — E1142 Type 2 diabetes mellitus with diabetic polyneuropathy: Secondary | ICD-10-CM

## 2018-05-18 MED ORDER — METHOCARBAMOL 500 MG PO TABS
500.0000 mg | ORAL_TABLET | Freq: Four times a day (QID) | ORAL | Status: DC | PRN
  Administered 2018-05-18 – 2018-06-02 (×36): 500 mg via ORAL
  Filled 2018-05-18 (×37): qty 1

## 2018-05-18 NOTE — Evaluation (Addendum)
Speech Language Pathology Assessment and Plan  Patient Details  Name: Richard Wang MRN: [id number] Date of Birth: 1945-04-17  SLP Diagnosis: Dysphagia;Dysarthria;Cognitive Impairments  Rehab Potential: Good ELOS: 3 weeks     Today's Date: 05/18/2018 SLP Individual Time: 0802-0900 SLP Individual Time Calculation (min): 58 min   Problem List:  Patient Active Problem List   Diagnosis Date Noted  . Debility 05/17/2018  . Acute and chronic respiratory failure with hypoxia ([place])   . Acute systolic heart failure ([place])   . Chronic atrial fibrillation   . Acute myocardial infarction, subendocardial infarction, subsequent episode of care ([place])   . Unspecified septicemia(038.9) ([place])   . Sepsis with acute organ dysfunction ([place])   . Acute and chronic respiratory failure with hypoxia [facility])    Past Medical History:  Past Medical History:  Diagnosis Date  . Acute and chronic respiratory failure with hypoxia ([place])   . Acute myocardial infarction, subendocardial infarction, subsequent episode of care ([place])   . Acute systolic heart failure ([place])   . Atrial fibrillation ([place])   . Chronic atrial fibrillation   . Crohn's colitis ([place])   . Diabetes mellitus without complication ([place])   . Hypertension   . Sepsis with acute organ dysfunction ([place])   . Unspecified septicemia(038.9) ([place])    Past Surgical History: History reviewed. No pertinent surgical history.  Assessment / Plan / Recommendation Clinical Impression   Richard Wang is a 73 year old right-handed male with history of COPD with remote tobacco abuse, CAD with non-STEMI maintained on aspirin, renal insufficiency, atrial fibrillation and hypertension. Initially presented to [facility] with increasing shortness of breath and lower extremity edema. Chest x-ray and echocardiogram showed moderate LV systolic dysfunction and moderate to severe MR and moderate pulmonary systolic hypertension consistent with CHF. Patient started  on BiPAP however deteriorated and ended up having to be intubated placed on ventilator. Patient subsequently complications associated with worsening renal function, congestive heart failure. Attempt at extubation failed and patient was transferred to [facility] 04/26/2018 for ventilation wean. Initially on IV Solu-Medrol and transitioned to by mouth prednisone after ventilation discontinued. Tracheostomy tube was not needed. Patient later extubated successfully and monitored. Currently maintained on prednisone 40 mg daily with slow taper. Subcutaneous heparin for DVT prophylaxis. Patient remained on aspirin therapy as well as Cardizem for history of atrial fibrillation. Swallow study completed maintained on a dysphagia #2 running thick liquid diet.  [facility]  Pt presents with moderate cognitive impairments with deficits including basic-semi-complex problem solving, emergent awareness, short term recall, and selective attention. 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 Pt confirms baseline memory deficits, suspect near baseline. SLP recommends continuing diagnostic assessment of cognitive skills and adjusting treatment plan. SLP spoke with niece Richard Wang via phone and family will bring glasses/dentures on Sunday 3/29. Pt presents with reduced vocal intensity, suggest due to edema from intubation and overall decontioning, impacting speech intelligibility in simple conversation within a moderately noisy environment. Oral care completed by OT prior to BSS and oral motor function appeared [facility]. Pt presents mild-moderate pharyngeal dysphagia, currently edentulous, consuming ice, thin via cup/straw, NTL via cup and HTL via cup with second swallow and no overt s/s aspiration, dys 3 with immediate cough and dys 2 no  overt s/s aspiration. Pt participated in [facility] on 3/24 while under [facility] care, however unable to obtain report,  only recommended diet dys 2/HTL and imaging, SLP left message for ST. SLP recommends continuing current diet dys 2 and HTL, awaiting MBS report to address diet advancement. Pt would continue to benefit from skilled ST services in order to maximize functional independence and reduce buren of care, requiring 24 hour supervision and likely [facility] services.    Skilled Therapeutic Interventions           Skilled ST services focused on cognitive skills. SLP facilitated cognitive linguistic assessment and provided education of results as well as therapeutic plan. SLP facilitated basic problem solving skills utilizing 4-5 step picture cards, pt required min A verbal cues for problem solving and error awareness. All questions were answered to satisfaction. Pt was left in room with call bell within reach and chair alarm set. ST recommends to continue skilled ST services.    SLP Assessment  Patient will need skilled Speech Lanaguage Pathology Services during CIR admission    Recommendations  SLP Diet Recommendations: Honey;Dysphagia 2 (Fine chop) Liquid Administration via: Cup Medication Administration: Whole meds with puree Supervision: Patient able to self feed Compensations: Multiple dry swallows after each bite/sip Postural Changes and/or Swallow Maneuvers: Seated upright 90 degrees Oral Care Recommendations: Oral care BID Patient destination: Home Follow up Recommendations: Home Health SLP;Outpatient SLP;24 hour supervision/assistance Equipment Recommended: None recommended by SLP    SLP Frequency 3 to 5 out of 7 days   SLP Duration  SLP Intensity  SLP Treatment/Interventions 3 weeks   Minumum of 1-2 x/day, 30 to 90 minutes  Cognitive remediation/compensation;Cueing hierarchy;Dysphagia/aspiration precaution training;Functional tasks;Medication managment;Speech/Language facilitation     Pain Pain Assessment Pain Score: 0-No pain  Prior Functioning Cognitive/Linguistic Baseline: Information not available Type of Home: House  Lives With: Family Available Help at Discharge: Family;Available 24 hours/day Education: 9th grade  Vocation: Retired  Industrial/product designer Term Goals: Week 1: SLP Short Term Goal 1 (Week 1): Pt will demonstrate orientation to place and time with supervision A verbal cues for external aid. SLP Short Term Goal 2 (Week 1): Pt will utilize external memory aids and compensatory strategies to recall new, daily information with supervision A verbal cues. SLP Short Term Goal 3 (Week 1): Pt will complete basic, familar tasks with supervision A verbal cues for problem solving. SLP Short Term Goal 4 (Week 1): Pt will self-monitor and self-correct functional errors during problem solving tasks with mod A verbal cues.  SLP Short Term Goal 5 (Week 1): Pt will consume current diet without overt s/s aspiration with min A verbal cues for use of swallow stratgeies.  SLP Short Term Goal 6 (Week 1): Pt will utilizie an increased vocal intensity in simple conversation to achieve 95% intelligiblity in moderately nosiy environment with supervison A verbal cues.  Refer to Care Plan for Long Term Goals  Recommendations for other services: None   Discharge Criteria: Patient will be discharged from SLP if patient refuses treatment 3 consecutive times without medical reason, if treatment goals not met, if there is a change in medical status, if patient makes no progress towards goals or if patient is discharged from hospital.  The above assessment, treatment plan, treatment alternatives and goals were discussed and mutually agreed upon: by patient  Richard Wang  [facility] 05/18/2018, 12:26 PM

## 2018-05-18 NOTE — Progress Notes (Signed)
[place] PHYSICAL MEDICINE & REHABILITATION PROGRESS NOTE   Subjective/Complaints:  No issues overnite ROS- denies CP, SOB< N/V/D Objective:   No results found. Recent Labs    05/17/18 1615  WBC 10.7*  HGB 8.1*  HCT 27.2*  PLT 187   Recent Labs    05/15/18 1007 05/17/18 1615  NA 144  --   K 3.6  --   CL 113*  --   CO2 23  --   GLUCOSE 109*  --   BUN 22  --   CREATININE 1.11 1.29*  CALCIUM 8.6*  --     Intake/Output Summary (Last 24 hours) at 05/18/2018 0858 Last data filed at 05/17/2018 2300 Gross per 24 hour  Intake 300 ml  Output 200 ml  Net 100 ml     Physical Exam: Vital Signs Blood pressure (!) 181/90, pulse 79, temperature 98.2 F (36.8 C), temperature source Oral, resp. rate 16, SpO2 98 %.   General: No acute distress Mood and affect are appropriate Heart: Regular rate and rhythm no rubs murmurs or extra sounds Lungs: Clear to auscultation, breathing unlabored, no rales or wheezes Abdomen: Positive bowel sounds, soft nontender to palpation, nondistended Extremities: No clubbing, cyanosis, or edema, bilateral foot intrinsic atrophy Skin: No evidence of breakdown, no evidence of rash Neurologic: Cranial nerves II through XII intact, motor strength is 5/5 in left and 4/5 right  bilateral deltoid, bicep, tricep, grip, 4- hip flexor, knee extensors,2-  ankle dorsiflexor and plantar flexor Sensory exam normal sensation reduced LT in left footCerebellar exam normal finger to nose to finger as well as heel to shin in bilateral upper and lower extremities Musculoskeletal: Full range of motion in all 4 extremities. No joint swelling   Assessment/Plan: 1. Functional deficits secondary to debility with bialteral foot drop which require 3+ hours per day of interdisciplinary therapy in a comprehensive inpatient rehab setting.  Physiatrist is providing close team supervision and 24 hour management of active medical problems listed below.  Physiatrist and rehab  team continue to assess barriers to discharge/monitor patient progress toward functional and medical goals  Care Tool:  Bathing              Bathing assist       Upper Body Dressing/Undressing Upper body dressing        Upper body assist      Lower Body Dressing/Undressing Lower body dressing            Lower body assist       Toileting Toileting    Toileting assist Assist for toileting: Moderate Assistance - Patient 50 - 74%     Transfers Chair/bed transfer  Transfers assist           Locomotion Ambulation   Ambulation assist              Walk 10 feet activity   Assist           Walk 50 feet activity   Assist           Walk 150 feet activity   Assist           Walk 10 feet on uneven surface  activity   Assist           Wheelchair     Assist               Wheelchair 50 feet with 2 turns activity    Assist  Wheelchair 150 feet activity     Assist          Medical Problem List and Plan: 1.Debilitysecondary to acute on chronic respiratory failure.Pt also with peripheral neuropathy and left > Right sided foot drop. History of low back pain, ?stenosis per pt report. I could not find any imaging or records pertaining to his back however. Pt states he had foot drop prior to the admission -CIR PT, OT -PRAFO LLE, likely will need AFO -Consider further work up of low back/ lower extremity sensory loss/weakness 2. Antithrombotics: -DVT/anticoagulation:Subcutaneous heparin -antiplatelet therapy: Aspirin 81 mg daily 3. Pain Management:Tylenol as needed Chronic low back pain , no focal tenderness, tylenol not helping , diffuse will add methocarbamol 4. Mood:Provide emotional support -antipsychotic agents: N/A 5. Neuropsych: This patientiscapable of making decisions on hisown behalf. 6. Skin/Wound  Care:routine skin checks 7. Fluids/Electrolytes/Nutrition:routineI's and O'swith follow-up chemistries requested.  -on D2 with honey liquids currently -supplement with HS IVF 1/2ns 50cc/hr 8.atrial fibrillation. Cardizem 30 mg twice a day, metoprolol 12.5 mg twice a day. Cardiac rate controlledat present 9. Hypertension. Hydralazine 20 mg every 8 hours, Norvasc 5 mg daily Vitals:   05/17/18 2204 05/18/18 0548  BP: (!) 142/77 (!) 181/90  Pulse: 97 79  Resp: 18 16  Temp: 98.5 F (36.9 C) 98.2 F (36.8 C)  SpO2: 97% 98%   10. 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 Monitor with decrease prednisone CBG (last 3)  Recent Labs    05/17/18 1641  GLUCAP 142*   11. Hyperlipidemia. Lipitor 12. Acute on chronic respiratory failure: -slow prednisone taper -oxygen as needed  12.  Anemia normochronic normocytic  LOS: 1 days A FACE TO FACE EVALUATION WAS PERFORMED  Erick Colace 05/18/2018, 8:58 AM

## 2018-05-18 NOTE — Progress Notes (Signed)
Orthopedic Tech Progress Note Patient Details:  Richard Wang Aug 10, 1945 [id number] Called in order to [facility] Patient ID: Guy Sandifer, male   DOB: June 13, 1945, 73 y.o.   MRN: [id number]   Donald Pore 05/18/2018, 8:33 AM

## 2018-05-18 NOTE — Evaluation (Signed)
Occupational Therapy Assessment and Plan  Patient Details  Name: Richard Wang MRN: [id number] Date of Birth: 1945/06/04  OT Diagnosis: acute pain, muscle weakness (generalized) and decreased sensation Rehab Potential: Rehab Potential (ACUTE ONLY): Fair ELOS: 18-21 days   Today's Date: 05/18/2018 OT Individual Time: 0700-0758 OT Individual Time Calculation (min): 58 min     Problem List:  Patient Active Problem List   Diagnosis Date Noted  . Debility 05/17/2018  . Acute and chronic respiratory failure with hypoxia ([place])   . Acute systolic heart failure ([place])   . Chronic atrial fibrillation   . Acute myocardial infarction, subendocardial infarction, subsequent episode of care ([place])   . Unspecified septicemia(038.9) ([place])   . Sepsis with acute organ dysfunction ([place])   . Acute and chronic respiratory failure with hypoxia [facility])     Past Medical History:  Past Medical History:  Diagnosis Date  . Acute and chronic respiratory failure with hypoxia ([place])   . Acute myocardial infarction, subendocardial infarction, subsequent episode of care ([place])   . Acute systolic heart failure ([place])   . Atrial fibrillation ([place])   . Chronic atrial fibrillation   . Crohn's colitis ([place])   . Diabetes mellitus without complication ([place])   . Hypertension   . Sepsis with acute organ dysfunction ([place])   . Unspecified septicemia(038.9) ([place])    Past Surgical History: History reviewed. No pertinent surgical history.  Assessment & Plan Clinical Impression: Patient is a 73 y.o. year old male  with history of COPD with remote tobacco abuse, CAD with non-STEMI maintained on aspirin, renal insufficiency, atrial fibrillation and hypertension. Initially presented to [facility] with increasing shortness of breath and lower extremity edema. Chest x-ray and echocardiogram showed moderate LV systolic dysfunction and moderate to severe MR and moderate pulmonary systolic hypertension consistent with CHF.  Patient started on BiPAP however deteriorated and ended up having to be intubated placed on ventilator. Patient subsequently complications associated with worsening renal function, congestive heart failure. Attempt at extubation failed and patient was transferred to [facility] 04/26/2018 for ventilation wean. Initially on IV Solu-Medrol and transitioned to by mouth prednisone after ventilation discontinued. Tracheostomy tube was not needed. Patient later extubated successfully and monitored. Currently maintained on prednisone 40 mg daily with slow taper. Subcutaneous heparin for DVT prophylaxis. Patient remained on aspirin therapy as well as Cardizem for history of atrial fibrillation. Swallow study completed maintained on a dysphagia #2puddingthick liquid diet .  Patient transferred to CIR on 05/17/2018 .    Patient currently requires max with basic self-care skills secondary to muscle weakness, decreased cardiorespiratoy endurance, decreased initiation, decreased attention, decreased awareness, decreased problem solving, decreased safety awareness and decreased memory and decreased sitting balance, decreased standing balance, hemiplegia and decreased balance strategies.  Prior to hospitalization, patient could complete ADLs with modified independent .  Patient will benefit from skilled intervention to decrease level of assist with basic self-care skills prior to discharge home with care partner.  Anticipate patient will require 24 hour supervision and follow up home health.  OT - End of Session Activity Tolerance: Decreased this session Endurance Deficit: Yes Endurance Deficit Description: deconditioned/debility, mild increase in work of breathing  OT Assessment Rehab Potential (ACUTE ONLY): Fair OT Barriers to Discharge: Other (comments) OT Barriers to Discharge Comments: none known at this time OT Patient demonstrates impairments in the following area(s):  Balance;Cognition;Endurance;Motor;Pain;Perception;Safety OT Basic ADL's Functional Problem(s): Grooming;Bathing;Dressing;Toileting OT Transfers Functional Problem(s): Toilet;Tub/Shower OT Additional Impairment(s): None OT Plan OT Intensity: Minimum  of 1-2 x/day, 45 to 90 minutes OT Frequency: 5 out of 7 days OT Duration/Estimated Length of Stay: 18-21 days OT Treatment/Interventions: Balance/vestibular training;Disease mangement/prevention;Neuromuscular re-education;Self Care/advanced ADL retraining;Therapeutic Exercise;Wheelchair propulsion/positioning;Cognitive remediation/compensation;DME/adaptive equipment instruction;Pain management;UE/LE Strength taining/ROM;Community reintegration;Functional electrical stimulation;Patient/family education;UE/LE Coordination activities;Discharge planning;Functional mobility training;Psychosocial support;Therapeutic Activities OT Self Feeding Anticipated Outcome(s): n/a OT Basic Self-Care Anticipated Outcome(s): supervision OT Toileting Anticipated Outcome(s): supervision OT Bathroom Transfers Anticipated Outcome(s): supervision OT Recommendation Recommendations for Other Services: Neuropsych consult Patient destination: Home Follow Up Recommendations: Home health OT Equipment Recommended: To be determined   Skilled Therapeutic Intervention Upon entering the room, pt supine in bed with 6/10 c/o low back pain. RN notified. Pt agreeable to OT intervention. OT educated pt on OT purpose, POC, and goals with pt verbalizing understanding. Pt performed supine >sit with min A and seated on EOB with set up A for bathing tasks. Pt fatigues very quickly and needing multiple rest breaks. Pt standing with  Mod A and able to pull pants over B hips. Pt transferred onto drop arm commode chair with max A and then to recliner chair with max squat pivot transfer. Pt requesting to remain in recliner chair with call bell and all needed items within reach. Pt needing min cuing  and increased to to sequence and process mobility.   OT Evaluation Precautions/Restrictions  Precautions Precautions: Fall Restrictions Weight Bearing Restrictions: No Pain Pain Assessment Pain Scale: 0-10 Pain Score: 0-No pain Pain Type: Chronic pain Pain Location: Back Pain Orientation: Lower Pain Intervention(s): Medication (See eMAR);MD notified (Comment) Home Living/Prior [place] expects to be discharged to:: Private residence Living Arrangements: Other relatives Available Help at Discharge: Family, Available 24 hours/day Type of Home: House Home Layout: One level Bathroom Shower/Tub: Optometrist: Yes  Lives With: Family IADL History Education: 9th grade  Prior Function Level of Independence: Independent with basic ADLs, Independent with homemaking with ambulation, Independent with gait, Independent with transfers Driving: Yes Vocation: Retired Comments: walked 50 feet at baseline before becoming SOB Vision Baseline Vision/History: Wears glasses Wears Glasses: Reading only Patient Visual Report: No change from baseline Vision Assessment?: No apparent visual deficits Perception  Perception: Within Functional Limits Praxis Praxis: Intact Cognition Overall Cognitive Status: No family/caregiver present to determine baseline cognitive functioning Arousal/Alertness: Awake/alert Orientation Level: Person;Place;Situation Person: Oriented Place: Oriented Situation: Oriented Year: 2020 Month: March Day of Week: Correct Memory: Impaired Memory Impairment: Storage deficit Immediate Memory Recall: Sock;Blue;Bed Memory Recall: Sock(1/3) Memory Recall Sock: Without Cue Attention: Selective;Sustained Sustained Attention: Appears intact Selective Attention: Impaired Selective Attention Impairment: Functional complex;Verbal complex;Functional basic;Verbal basic Awareness:  Impaired Awareness Impairment: Emergent impairment;Anticipatory impairment Problem Solving: Impaired Problem Solving Impairment: Functional basic;Functional complex;Verbal complex;Verbal basic Safety/Judgment: Appears intact Sensation Sensation Light Touch: Impaired Detail Central sensation comments: intact  Peripheral sensation comments: decreased Light Touch Impaired Details: Impaired RLE;Impaired LLE Coordination Gross Motor Movements are Fluid and Coordinated: No Fine Motor Movements are Fluid and Coordinated: Yes Heel Shin Test: impaired bilaterally 2/2 weakness Motor  Motor Motor: Other (comment) Motor - Skilled Clinical Observations: generalized weakness, L foot drop  Mobility  Bed Mobility Bed Mobility: Rolling Left;Rolling Right;Supine to Sit;Sit to Supine Rolling Right: Supervision/verbal cueing Rolling Left: Supervision/Verbal cueing Supine to Sit: Supervision/Verbal cueing Sit to Supine: Supervision/Verbal cueing Transfers Sit to Stand: Maximal Assistance - Patient 25-49% Stand to Sit: Maximal Assistance - Patient 25-49%  Trunk/Postural Assessment  Cervical Assessment Cervical Assessment: Exceptions to WFL(forward head) Thoracic Assessment Thoracic Assessment: Within Functional Limits Lumbar  Assessment Lumbar Assessment: Within Functional Limits Postural Control Postural Control: Deficits on evaluation(delayed)  Balance Balance Balance Assessed: Yes Static Sitting Balance Static Sitting - Balance Support: Feet supported;No upper extremity supported Static Sitting - Level of Assistance: 5: Stand by assistance Dynamic Sitting Balance Dynamic Sitting - Balance Support: Feet supported;No upper extremity supported Dynamic Sitting - Level of Assistance: 4: Min Insurance risk surveyor Standing - Balance Support: Bilateral upper extremity supported;During functional activity Static Standing - Level of Assistance: 4: Min assist Extremity/Trunk  Assessment RUE Assessment RUE Assessment: Exceptions to [facility] Passive Range of Motion (PROM) Comments: WFLs Active Range of Motion (AROM) Comments: WFLs General Strength Comments: 3+/5 LUE Assessment LUE Assessment: Exceptions to [facility] Passive Range of Motion (PROM) Comments: WFLs Active Range of Motion (AROM) Comments: WFLs General Strength Comments: 3+/5     Refer to Care Plan for Long Term Goals  Recommendations for other services: Neuropsych   Discharge Criteria: Patient will be discharged from OT if patient refuses treatment 3 consecutive times without medical reason, if treatment goals not met, if there is a change in medical status, if patient makes no progress towards goals or if patient is discharged from hospital.  The above assessment, treatment plan, treatment alternatives and goals were discussed and mutually agreed upon: by patient  Gypsy Decant 05/18/2018, 12:45 PM

## 2018-05-18 NOTE — Evaluation (Addendum)
Physical Therapy Assessment and Plan  Patient Details  Name: Richard Wang MRN: [id number] Date of Birth: 31-Jan-1946  PT Diagnosis: Abnormal posture, Abnormality of gait, Coordination disorder, Difficulty walking, Impaired sensation and Muscle weakness Rehab Potential: Good ELOS: 2.5-3 weeks   Today's Date: 05/18/2018 PT Individual Time: 0737-1062 PT Individual Time Calculation (min): 67 min    Problem List:  Patient Active Problem List   Diagnosis Date Noted  . Debility 05/17/2018  . Acute and chronic respiratory failure with hypoxia ([place])   . Acute systolic heart failure ([place])   . Chronic atrial fibrillation   . Acute myocardial infarction, subendocardial infarction, subsequent episode of care ([place])   . Unspecified septicemia(038.9) ([place])   . Sepsis with acute organ dysfunction ([place])   . Acute and chronic respiratory failure with hypoxia [facility])     Past Medical History:  Past Medical History:  Diagnosis Date  . Acute and chronic respiratory failure with hypoxia ([place])   . Acute myocardial infarction, subendocardial infarction, subsequent episode of care ([place])   . Acute systolic heart failure ([place])   . Atrial fibrillation ([place])   . Chronic atrial fibrillation   . Crohn's colitis ([place])   . Diabetes mellitus without complication ([place])   . Hypertension   . Sepsis with acute organ dysfunction ([place])   . Unspecified septicemia(038.9) ([place])    Past Surgical History: History reviewed. No pertinent surgical history.  Assessment & Plan Clinical Impression: Patient is a 73 year old right-handed male with history of COPD with remote tobacco abuse, CAD with non-STEMI maintained on aspirin, renal insufficiency, atrial fibrillation and hypertension. Initially presented to [facility] with increasing shortness of breath and lower extremity edema. Chest x-ray and echocardiogram showed moderate LV systolic dysfunction and moderate to severe MR and moderate pulmonary systolic  hypertension consistent with CHF. Patient started on BiPAP however deteriorated and ended up having to be intubated placed on ventilator. Patient subsequently complications associated with worsening renal function, congestive heart failure. Attempt at extubation failed and patient was transferred to [facility] 04/26/2018 for ventilation wean. Initially on IV Solu-Medrol and transitioned to by mouth prednisone after ventilation discontinued. Tracheostomy tube was not needed. Patient later extubated successfully and monitored. Currently maintained on prednisone 40 mg daily with slow taper. Subcutaneous heparin for DVT prophylaxis. Patient remained on aspirin therapy as well as Cardizem for history of atrial fibrillation. Swallow study completed maintained on a dysphagia #2 running thick liquid diet. Patient transferred to CIR on 05/17/2018 .   Patient currently requires max with mobility secondary to muscle weakness and muscle paralysis, decreased cardiorespiratoy endurance, decreased coordination and L foot drop and decreased sitting balance, decreased standing balance, decreased postural control and decreased balance strategies.  Prior to hospitalization, patient was independent  with mobility and lived with Family in a House home.  Home access is  Ramped entrance.  Patient will benefit from skilled PT intervention to maximize safe functional mobility, minimize fall risk and decrease caregiver burden for planned discharge home with 24 hour supervision.  Anticipate patient will benefit from follow up [place] at discharge.  PT - End of Session Activity Tolerance: Tolerates < 10 min activity, no significant change in vital signs Endurance Deficit: Yes Endurance Deficit Description: deconditioned/debility, mild increase in work of breathing  PT Assessment Rehab Potential (ACUTE/IP ONLY): Good PT Barriers to Discharge: Medical stability PT Patient demonstrates impairments in the following area(s):  Balance;Endurance;Motor;Pain;Safety;Sensory PT Transfers Functional Problem(s): Bed Mobility;Bed to Chair;Car;Furniture;Floor PT Locomotion Functional Problem(s): Stairs;Wheelchair Mobility;Ambulation PT  Plan PT Intensity: Minimum of 1-2 x/day ,45 to 90 minutes PT Frequency: 5 out of 7 days PT Duration Estimated Length of Stay: 2.5-3 weeks PT Transfers Anticipated Outcome(s): supervision PT Locomotion Anticipated Outcome(s): supervision short distance gait w/ LRAD PT Recommendation Recommendations for Other Services: Neuropsych consult Follow Up Recommendations: Home health PT Patient destination: Home Equipment Recommended: To be determined  Skilled Therapeutic Intervention  Pt in supine and agreeable to therapy, pain as detailed below. Performed functional mobility as detailed below including bed mobility, transfers, w/c mobility, and gait. Sit<>stands from w/c w/ max assist and ambulated 5' x2 w/ RW and 2nd helper providing w/c follow for safety. Pt most limited by chronic back pain in distance. Attempted to perform car transfer, however pt too fatigued to take pivot steps once in standing. Instructed pt in results of PT evaluation as detailed below, PT POC, rehab potential, rehab goals, and discharge recommendations. Returned to room and performed stedy transfer to recliner 2/2 fatigue level. Discussed CIR's policies regarding fall safety and use of chair alarm and/or quick release belt. Pt verbalized understanding and in agreement. Ended session in recliner, all needs in reach.  PT Evaluation Precautions/Restrictions Precautions Precautions: Fall Restrictions Weight Bearing Restrictions: No Pain Pain Assessment Pain Scale: 0-10 Pain Score: 5  Pain Type: Chronic pain Pain Location: Back Pain Orientation: Lower Pain Onset: On-going Pain Intervention(s): RN made aware Home Living/Prior Functioning Home Living Available Help at Discharge: Family;Available 24 hours/day(sister and  brother-in-law) Type of Home: House Home Access: Ramped entrance Home Layout: One level Bathroom Shower/Tub: Chiropodist: Standard Bathroom Accessibility: Yes  Lives With: Family Prior Function Level of Independence: Independent with basic ADLs;Independent with homemaking with ambulation;Independent with gait;Independent with transfers Driving: Yes Vocation: Retired Comments: walked 50 feet at baseline before becoming SOB, limited Clinical biochemist: Within Advertising copywriter Praxis Praxis: Intact  Cognition Overall Cognitive Status: No family/caregiver present to determine baseline cognitive functioning Arousal/Alertness: Awake/alert Orientation Level: Oriented to situation;Disoriented to time;Disoriented to place Attention: Selective;Sustained Sustained Attention: Appears intact Selective Attention: Impaired Selective Attention Impairment: Functional complex;Verbal complex;Functional basic;Verbal basic Memory: Impaired Memory Impairment: Storage deficit Awareness: Impaired Awareness Impairment: Emergent impairment;Anticipatory impairment Problem Solving: Impaired Problem Solving Impairment: Functional basic;Functional complex;Verbal complex;Verbal basic Safety/Judgment: Appears intact Sensation Sensation Light Touch: Impaired Detail Central sensation comments: intact  Peripheral sensation comments: diminished in stocking distribution  Light Touch Impaired Details: Impaired RLE;Impaired LLE Coordination Gross Motor Movements are Fluid and Coordinated: No Heel Shin Test: impaired bilaterally 2/2 weakness Motor  Motor Motor: Other (comment) Motor - Skilled Clinical Observations: generalized weakness, L foot drop   Mobility Bed Mobility Bed Mobility: Rolling Left;Rolling Right;Supine to Sit;Sit to Supine Rolling Right: Supervision/verbal cueing Rolling Left: Supervision/Verbal cueing Supine to Sit:  Supervision/Verbal cueing Sit to Supine: Supervision/Verbal cueing Transfers Transfers: Sit to Stand;Stand to Sit;Stand Pivot Transfers Sit to Stand: Maximal Assistance - Patient 25-49% Stand to Sit: Maximal Assistance - Patient 25-49% Stand Pivot Transfers: Moderate Assistance - Patient 50 - 74% Stand Pivot Transfer Details: Manual facilitation for weight shifting;Manual facilitation for placement;Verbal cues for safe use of DME/AE;Verbal cues for precautions/safety;Verbal cues for technique;Tactile cues for initiation;Tactile cues for posture Transfer (Assistive device): Rolling walker Locomotion  Gait Ambulation: Yes Gait Assistance: 2 Helpers(mod assist +2 w/c follow) Gait Distance (Feet): 5 Feet Assistive device: Rolling walker Gait Assistance Details: Manual facilitation for weight bearing;Manual facilitation for weight shifting;Manual facilitation for placement;Verbal cues for technique;Verbal cues for gait pattern;Tactile cues for posture Gait Gait:  Yes Gait Pattern: Impaired Gait Pattern: Step-to pattern;Poor foot clearance - left;Decreased dorsiflexion - left;Decreased trunk rotation;Trunk flexed;Poor foot clearance - right Gait velocity: decreased 2/2 chronic back pain Stairs / Additional Locomotion Stairs: No Wheelchair Mobility Wheelchair Mobility: Yes Wheelchair Assistance: Chartered loss adjuster: Both upper extremities Wheelchair Parts Management: Needs assistance Distance: 150'  Trunk/Postural Assessment  Cervical Assessment Cervical Assessment: Exceptions to WFL(forward head) Thoracic Assessment Thoracic Assessment: Within Functional Limits Lumbar Assessment Lumbar Assessment: Within Functional Limits Postural Control Postural Control: Deficits on evaluation(insufficient)  Balance Balance Balance Assessed: Yes Static Sitting Balance Static Sitting - Balance Support: Feet supported;No upper extremity supported Static Sitting - Level  of Assistance: 5: Stand by assistance Dynamic Sitting Balance Dynamic Sitting - Balance Support: Feet supported;No upper extremity supported Dynamic Sitting - Level of Assistance: 4: Min Insurance risk surveyor Standing - Balance Support: Bilateral upper extremity supported;During functional activity Static Standing - Level of Assistance: 4: Min assist Extremity Assessment  RLE Assessment RLE Assessment: Exceptions to [facility] Passive Range of Motion (PROM) Comments: WFL, mild gastroc tightness RLE Strength RLE Overall Strength: Deficits Right Hip Flexion: 3-/5 Right Hip Extension: 3-/5 Right Hip ABduction: 3/5 Right Hip ADduction: 3/5 Right Knee Flexion: 3-/5 Right Knee Extension: 3-/5 Right Ankle Dorsiflexion: 2+/5 Right Ankle Plantar Flexion: 2+/5 LLE Assessment LLE Assessment: Exceptions to [facility] Passive Range of Motion (PROM) Comments: WFL, mild gastroc tightness LLE Strength LLE Overall Strength: Deficits Left Hip Flexion: 3/5 Left Hip Extension: 3-/5 Left Hip ABduction: 3/5 Left Hip ADduction: 3/5 Left Knee Flexion: 3-/5 Left Knee Extension: 3-/5 Left Ankle Dorsiflexion: 1/5 Left Ankle Plantar Flexion: 2-/5    Refer to Care Plan for Long Term Goals  Recommendations for other services: None   Discharge Criteria: Patient will be discharged from PT if patient refuses treatment 3 consecutive times without medical reason, if treatment goals not met, if there is a change in medical status, if patient makes no progress towards goals or if patient is discharged from hospital.  The above assessment, treatment plan, treatment alternatives and goals were discussed and mutually agreed upon: by patient  Karthik [place]ittinghill K Kirra Verga 05/18/2018, 2:13 PM

## 2018-05-19 ENCOUNTER — Inpatient Hospital Stay (HOSPITAL_COMMUNITY): Payer: Medicare Other

## 2018-05-19 ENCOUNTER — Inpatient Hospital Stay (HOSPITAL_COMMUNITY): Payer: Medicare Other | Admitting: Occupational Therapy

## 2018-05-19 ENCOUNTER — Inpatient Hospital Stay (HOSPITAL_COMMUNITY): Payer: Medicare Other | Admitting: Physical Therapy

## 2018-05-19 NOTE — Progress Notes (Signed)
[facility] PHYSICAL MEDICINE & REHABILITATION PROGRESS NOTE   Subjective/Complaints:  No issues overnite except freq urination I/O inaccurate discussed with RN  ROS- denies CP, SOB< N/V/D Objective:   No results found. Recent Labs    05/17/18 1615  WBC 10.7*  HGB 8.1*  HCT 27.2*  PLT 187   Recent Labs    05/17/18 1615  CREATININE 1.29*    Intake/Output Summary (Last 24 hours) at 05/19/2018 0755 Last data filed at 05/19/2018 0700 Gross per 24 hour  Intake 240 ml  Output 1150 ml  Net -910 ml     Physical Exam: Vital Signs Blood pressure (!) 147/58, pulse 64, temperature 98.3 F (36.8 C), temperature source Oral, resp. rate 18, SpO2 100 %.   General: No acute distress Mood and affect are appropriate Heart: Regular rate and rhythm no rubs murmurs or extra sounds Lungs: Clear to auscultation, breathing unlabored, no rales or wheezes Abdomen: Positive bowel sounds, soft nontender to palpation, nondistended Extremities: No clubbing, cyanosis, or edema, bilateral foot intrinsic atrophy Skin: No evidence of breakdown, no evidence of rash Neurologic: Cranial nerves II through XII intact, motor strength is 5/5 in left and 4/5 right  bilateral deltoid, bicep, tricep, grip, 4- hip flexor, knee extensors,2-  ankle dorsiflexor and plantar flexor Sensory exam normal sensation reduced LT in left footCerebellar exam normal finger to nose to finger as well as heel to shin in bilateral upper and lower extremities Musculoskeletal: Full range of motion in all 4 extremities. No joint swelling   Assessment/Plan: 1. Functional deficits secondary to debility with bilalteral foot drop which require 3+ hours per day of interdisciplinary therapy in a comprehensive inpatient rehab setting.  Physiatrist is providing close team supervision and 24 hour management of active medical problems listed below.  Physiatrist and rehab team continue to assess barriers to discharge/monitor patient  progress toward functional and medical goals  Care Tool:  Bathing    Body parts bathed by patient: Right arm, Left arm, Chest, Abdomen, Front perineal area, Right upper leg, Left upper leg, Face   Body parts bathed by helper: Buttocks, Right lower leg, Left lower leg     Bathing assist Assist Level: Moderate Assistance - Patient 50 - 74%     Upper Body Dressing/Undressing Upper body dressing   What is the patient wearing?: Pull over shirt    Upper body assist Assist Level: Minimal Assistance - Patient > 75%    Lower Body Dressing/Undressing Lower body dressing      What is the patient wearing?: Pants     Lower body assist Assist for lower body dressing: Moderate Assistance - Patient 50 - 74%     Toileting Toileting    Toileting assist Assist for toileting: Minimal Assistance - Patient > 75%     Transfers Chair/bed transfer  Transfers assist     Chair/bed transfer assist level: Moderate Assistance - Patient 50 - 74%     Locomotion Ambulation   Ambulation assist   Ambulation activity did not occur: Safety/medical concerns          Walk 10 feet activity   Assist  Walk 10 feet activity did not occur: Safety/medical concerns        Walk 50 feet activity   Assist Walk 50 feet with 2 turns activity did not occur: Safety/medical concerns         Walk 150 feet activity   Assist Walk 150 feet activity did not occur: Safety/medical concerns  Walk 10 feet on uneven surface  activity   Assist Walk 10 feet on uneven surfaces activity did not occur: Safety/medical concerns         Wheelchair     Assist Will patient use wheelchair at discharge?: (TBD) Type of Wheelchair: Manual    Wheelchair assist level: Supervision/Verbal cueing Max wheelchair distance: 150'    Wheelchair 50 feet with 2 turns activity    Assist        Assist Level: Supervision/Verbal cueing   Wheelchair 150 feet activity     Assist      Assist Level: Supervision/Verbal cueing    Medical Problem List and Plan: 1.Debilitysecondary to acute on chronic respiratory failure.Pt also with peripheral neuropathy and left > Right sided foot drop. History of low back pain, ?stenosis per pt report. I could not find any imaging or records pertaining to his back however. Pt states he had foot drop prior to the admission -CIR PT, OT -PRAFO LLE, likely will need AFO -Consider further work up of low back/ lower extremity sensory loss/weakness 2. Antithrombotics: -DVT/anticoagulation:Subcutaneous heparin -antiplatelet therapy: Aspirin 81 mg daily 3. Pain Management:Tylenol as needed Chronic low back pain , no focal tenderness, tylenol not helping ,added  Methocarbamol pt states it is helping 4. Mood:Provide emotional support -antipsychotic agents: N/A 5. Neuropsych: This patientiscapable of making decisions on hisown behalf. 6. Skin/Wound Care:routine skin checks 7. Fluids/Electrolytes/Nutrition:routineI's and O'swith follow-up chemistries requested.  -on D2 with honey liquids currently -supplement with HS IVF 1/2ns 50cc/hr, intake improved pt c/o freq urination at noc 8.atrial fibrillation. Cardizem 30 mg twice a day, metoprolol 12.5 mg twice a day. Cardiac rate controlledat present 9. Hypertension. 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 Monitor with decrease prednisone CBG (last 3)  Recent Labs    05/17/18 1641  GLUCAP 142*   11. Hyperlipidemia. Lipitor 12. Acute on chronic respiratory failure: -slow prednisone taper -oxygen as needed  12.  Anemia  normochronic normocytic  LOS: 2 days A FACE TO FACE EVALUATION WAS PERFORMED  Erick Colace 05/19/2018, 7:55 AM

## 2018-05-20 ENCOUNTER — Inpatient Hospital Stay (HOSPITAL_COMMUNITY): Payer: Medicare Other | Admitting: Physical Therapy

## 2018-05-20 ENCOUNTER — Inpatient Hospital Stay (HOSPITAL_COMMUNITY): Payer: Medicare Other | Admitting: Speech Pathology

## 2018-05-20 ENCOUNTER — Inpatient Hospital Stay (HOSPITAL_COMMUNITY): Payer: Medicare Other | Admitting: Occupational Therapy

## 2018-05-20 DIAGNOSIS — T380X5A Adverse effect of glucocorticoids and synthetic analogues, initial encounter: Secondary | ICD-10-CM

## 2018-05-20 DIAGNOSIS — D72828 Other elevated white blood cell count: Secondary | ICD-10-CM

## 2018-05-20 DIAGNOSIS — I4891 Unspecified atrial fibrillation: Secondary | ICD-10-CM

## 2018-05-20 DIAGNOSIS — J9620 Acute and chronic respiratory failure, unspecified whether with hypoxia or hypercapnia: Secondary | ICD-10-CM

## 2018-05-20 DIAGNOSIS — R739 Hyperglycemia, unspecified: Secondary | ICD-10-CM

## 2018-05-20 DIAGNOSIS — I10 Essential (primary) hypertension: Secondary | ICD-10-CM

## 2018-05-20 DIAGNOSIS — R350 Frequency of micturition: Secondary | ICD-10-CM

## 2018-05-20 DIAGNOSIS — D72829 Elevated white blood cell count, unspecified: Secondary | ICD-10-CM

## 2018-05-20 DIAGNOSIS — E46 Unspecified protein-calorie malnutrition: Secondary | ICD-10-CM

## 2018-05-20 DIAGNOSIS — J9610 Chronic respiratory failure, unspecified whether with hypoxia or hypercapnia: Secondary | ICD-10-CM

## 2018-05-20 DIAGNOSIS — D649 Anemia, unspecified: Secondary | ICD-10-CM

## 2018-05-20 DIAGNOSIS — E8809 Other disorders of plasma-protein metabolism, not elsewhere classified: Secondary | ICD-10-CM

## 2018-05-20 DIAGNOSIS — R0989 Other specified symptoms and signs involving the circulatory and respiratory systems: Secondary | ICD-10-CM

## 2018-05-20 LAB — CBC WITH DIFFERENTIAL/PLATELET
Abs Immature Granulocytes: 0.04 10*3/uL (ref 0.00–0.07)
Basophils Absolute: 0 10*3/uL (ref 0.0–0.1)
Basophils Relative: 0 %
Eosinophils Absolute: 0 10*3/uL (ref 0.0–0.5)
Eosinophils Relative: 0 %
HCT: 28.3 % — ABNORMAL LOW (ref 39.0–52.0)
Hemoglobin: 8.8 g/dL — ABNORMAL LOW (ref 13.0–17.0)
Immature Granulocytes: 0 %
Lymphocytes Relative: 10 %
Lymphs Abs: 1.2 10*3/uL (ref 0.7–4.0)
MCH: 28.7 pg (ref 26.0–34.0)
MCHC: 31.1 g/dL (ref 30.0–36.0)
MCV: 92.2 fL (ref 80.0–100.0)
Monocytes Absolute: 0.7 10*3/uL (ref 0.1–1.0)
Monocytes Relative: 6 %
Neutro Abs: 9.7 10*3/uL — ABNORMAL HIGH (ref 1.7–7.7)
Neutrophils Relative %: 84 %
PLATELETS: 197 10*3/uL (ref 150–400)
RBC: 3.07 MIL/uL — AB (ref 4.22–5.81)
RDW: 17.3 % — ABNORMAL HIGH (ref 11.5–15.5)
WBC: 11.6 10*3/uL — AB (ref 4.0–10.5)
nRBC: 0 % (ref 0.0–0.2)

## 2018-05-20 LAB — COMPREHENSIVE METABOLIC PANEL
ALT: 44 U/L (ref 0–44)
AST: 18 U/L (ref 15–41)
Albumin: 3.2 g/dL — ABNORMAL LOW (ref 3.5–5.0)
Alkaline Phosphatase: 64 U/L (ref 38–126)
Anion gap: 9 (ref 5–15)
BUN: 15 mg/dL (ref 8–23)
CO2: 27 mmol/L (ref 22–32)
Calcium: 8.9 mg/dL (ref 8.9–10.3)
Chloride: 106 mmol/L (ref 98–111)
Creatinine, Ser: 1.19 mg/dL (ref 0.61–1.24)
GFR calc Af Amer: >60 mL/min (ref >60)
GFR calc non Af Amer: >60 mL/min (ref >60)
Glucose, Bld: 83 mg/dL (ref 70–99)
Potassium: 3.8 mmol/L (ref 3.5–5.1)
Sodium: 142 mmol/L (ref 135–145)
Total Bilirubin: 1.3 mg/dL — ABNORMAL HIGH (ref 0.3–1.2)
Total Protein: 5.9 g/dL — ABNORMAL LOW (ref 6.5–8.1)

## 2018-05-20 LAB — GLUCOSE, CAPILLARY
Glucose-Capillary: 112 mg/dL — ABNORMAL HIGH (ref 70–99)
Glucose-Capillary: 199 mg/dL — ABNORMAL HIGH (ref 70–99)
Glucose-Capillary: 153 mg/dL — ABNORMAL HIGH (ref 70–99)

## 2018-05-20 MED ORDER — PRO-STAT SUGAR FREE PO LIQD
30.0000 mL | Freq: Two times a day (BID) | ORAL | Status: DC
  Administered 2018-05-20 – 2018-06-02 (×27): 30 mL via ORAL
  Filled 2018-05-20 (×27): qty 30

## 2018-05-20 NOTE — IPOC Note (Signed)
Overall Plan of Care [facility]) Patient Details Name: Richard Wang MRN: [id number] DOB: 19-Jul-1945  Admitting Diagnosis: Debility  Hospital Problems: Active Problems:   Debility   Chronic respiratory failure (HCC)   Atrial fibrillation (HCC)   Labile blood pressure   Essential hypertension   Steroid-induced hyperglycemia   Normocytic anemia   Acute on chronic respiratory failure (HCC)   Leucocytosis   Hypoalbuminemia due to protein-calorie malnutrition (HCC)     Functional Problem List: Nursing Bowel  PT Balance, Endurance, Motor, Pain, Safety, Sensory  OT Balance, Cognition, Endurance, Motor, Pain, Perception, Safety  SLP Cognition  TR         Basic ADL's: OT Grooming, Bathing, Dressing, Toileting     Advanced  ADL's: OT       Transfers: PT Bed Mobility, Bed to Chair, Car, Furniture, Civil Service fast streamer, Research scientist (life sciences): PT Stairs, Psychologist, prison and probation services, Ambulation     Additional Impairments: OT None  SLP Swallowing, Communication, Social Cognition expression Problem Solving, Memory, Awareness, Attention  TR      Anticipated Outcomes Item Anticipated Outcome  Self Feeding n/a  Swallowing  Mod I   Basic self-care  supervision  Toileting  supervision   Bathroom Transfers supervision  Bowel/Bladder  Min assist  Transfers  supervision  Locomotion  supervision short distance gait w/ LRAD  Communication  Mod I  Cognition  Sup-Mod A higher level problem solving   Pain  < 3  Safety/Judgment  Mod I assist   Therapy Plan: PT Intensity: Minimum of 1-2 x/day ,45 to 90 minutes PT Frequency: 5 out of 7 days PT Duration Estimated Length of Stay: 2.5-3 weeks OT Intensity: Minimum of 1-2 x/day, 45 to 90 minutes OT Frequency: 5 out of 7 days OT Duration/Estimated Length of Stay: 18-21 days SLP Intensity: Minumum of 1-2 x/day, 30 to 90 minutes SLP Frequency: 3 to 5 out of 7 days SLP Duration/Estimated Length of Stay: 3 weeks     Team  Interventions: Nursing Interventions Patient/Family Education, Bladder Management, Disease Management/Prevention, Medication Management, Cognitive Remediation/Compensation, Psychosocial Support, Skin Care/Wound Management  PT interventions    OT Interventions Balance/vestibular training, Disease mangement/prevention, Neuromuscular re-education, Self Care/advanced ADL retraining, Therapeutic Exercise, Wheelchair propulsion/positioning, Cognitive remediation/compensation, DME/adaptive equipment instruction, Pain management, UE/LE Strength taining/ROM, Community reintegration, Functional electrical stimulation, Patient/family education, UE/LE Coordination activities, Discharge planning, Functional mobility training, Psychosocial support, Therapeutic Activities  SLP Interventions Cognitive remediation/compensation, Cueing hierarchy, Dysphagia/aspiration precaution training, Functional tasks, Medication managment, Speech/Language facilitation  TR Interventions    SW/CM Interventions Discharge Planning, Psychosocial Support, Patient/Family Education   Barriers to Discharge MD  Medical stability  Nursing      PT Medical stability    OT Other (comments) none known at this time  SLP      SW       Team Discharge Planning: Destination: PT-Home ,OT- Home , SLP-Home Projected Follow-up: PT-Home health PT, OT-  Home health OT, SLP-Home Health SLP, Outpatient SLP, 24 hour supervision/assistance Projected Equipment Needs: PT-To be determined, OT- To be determined, SLP-None recommended by SLP Equipment Details: PT- , OT-  Patient/family involved in discharge planning: PT- Patient,  OT-Patient, SLP-Patient  MD ELOS: 14-17 days. Medical Rehab Prognosis:  Good Assessment: 73 year old right-handed male with history of COPD with remote tobacco abuse, CAD with non-STEMI maintained on aspirin, renal insufficiency, atrial fibrillation and hypertension. Initially presented to [facility] with increasing  shortness of breath and lower extremity edema. Chest x-ray and echocardiogram showed moderate LV  systolic dysfunction and moderate to severe MR and moderate pulmonary systolic hypertension consistent with CHF. Patient started on BiPAP however deteriorated and ended up having to be intubated placed on ventilator. Patient subsequently complications associated with worsening renal function, congestive heart failure. Attempt at extubation failed and patient was transferred to [facility] 04/26/2018 for ventilation wean. Initially on IV Solu-Medrol and transitioned to by mouth prednisone after ventilation discontinued. Tracheostomy tube was not needed. Patient later extubated successfully and monitored. Currently maintained on prednisone 40 mg daily with slow taper. Subcutaneous heparin for DVT prophylaxis. Patient remained on aspirin therapy as well as Cardizem for history of atrial fibrillation. Swallow study completed maintained on a dysphagia # honey thick liquid diet.  Patient with resulting functional deficits with mobility, endurance, self-care.  We will set goals for supervision/mod I with PT/OT/SLP.  See Team Conference Notes for weekly updates to the plan of care

## 2018-05-20 NOTE — Discharge Instructions (Signed)
Inpatient Rehab Discharge Instructions  Richard Wang Discharge date and time: No discharge date for patient encounter.   Activities/Precautions/ Functional Status: Activity: activity as tolerated Diet:  Wound Care: none needed Functional status:  ___ No restrictions     ___ Walk up steps independently ___ 24/7 supervision/assistance   ___ Walk up steps with assistance ___ Intermittent supervision/assistance  ___ Bathe/dress independently ___ Walk with walker     _x__ Bathe/dress with assistance ___ Walk Independently    ___ Shower independently ___ Walk with assistance    ___ Shower with assistance ___ No alcohol     ___ Return to work/school ________  Special Instructions: No driving or smoking   My questions have been answered and I understand these instructions. I will adhere to these goals and the provided educational materials after my discharge from the hospital.  Patient/Caregiver Signature _______________________________ Date __________  Clinician Signature _______________________________________ Date __________  Please bring this form and your medication list with you to all your follow-up doctor's appointments.

## 2018-05-20 NOTE — Progress Notes (Signed)
Pharmacy is unable to confirm the medications the patient was taking at home. All options have been exhausted and a resolution to the situation is not expected.   [facility] did not send a med list and when we called, they said the meds were dc'd when the patient transferred and cannot be retrieved.   Where possible, their outpatient pharmacy(s) have been contacted for the last time prescriptions were filled and that information has been added to each medication in an Order Note (highlighted yellow below the medication).  Please contact pharmacy if further assistance is needed.   Charolotte Eke, PharmD. Mobile: [phone]. 05/20/2018,1:25 PM.

## 2018-05-20 NOTE — Plan of Care (Signed)
  Problem: Consults Goal: RH GENERAL PATIENT EDUCATION Description See Patient Education module for education specifics. Outcome: Progressing Goal: Diabetes Guidelines if Diabetic/Glucose > 140 Description If diabetic or lab glucose is > 140 mg/dl - Initiate Diabetes/Hyperglycemia Guidelines & Document Interventions  Outcome: Progressing   Problem: RH BLADDER ELIMINATION Goal: RH STG MANAGE BLADDER WITH ASSISTANCE Description STG Manage Bladder With min Assistance  Outcome: Progressing Flowsheets (Taken 05/20/2018 1420) STG: Pt will manage bladder with assistance: 1-Total assistance   Problem: RH SKIN INTEGRITY Goal: RH STG SKIN FREE OF INFECTION/BREAKDOWN Description Patients skin will remain free from further infection or breakdown with mod I assist.     Outcome: Progressing Goal: RH STG MAINTAIN SKIN INTEGRITY WITH ASSISTANCE Description STG Maintain Skin Integrity With mod I Assistance.  Outcome: Progressing Goal: RH STG ABLE TO PERFORM INCISION/WOUND CARE W/ASSISTANCE Description STG Able To Perform Incision/Wound Care With mod I Assistance.  Outcome: Progressing Flowsheets (Taken 05/20/2018 1420) STG: Pt will be able to perform incision/wound care with assistance: 4-Minimal assistance   Problem: RH SAFETY Goal: RH STG ADHERE TO SAFETY PRECAUTIONS W/ASSISTANCE/DEVICE Description STG Adhere to Safety Precautions With mod I Assistance/Device.  Outcome: Progressing Flowsheets (Taken 05/20/2018 1420) STG:Pt will adhere to safety precautions with assistance/device: 4-Minimal assistance   Problem: RH BOWEL ELIMINATION Goal: RH STG MANAGE BOWEL WITH ASSISTANCE Description STG Manage Bowel with min Assistance.  Outcome: Progressing

## 2018-05-20 NOTE — Progress Notes (Signed)
Inpatient Rehabilitation  Patient information reviewed and entered into eRehab system by Marwah Disbro M. Taralee Marcus, M.A., CCC/SLP, PPS Coordinator.  Information including medical coding, functional ability and quality indicators will be reviewed and updated through discharge.    Per nursing patient was given "Data Collection Information Summary" for Patients in Inpatient Rehabilitation Facilities with attached "Privacy Act Statement-Health Care Records" upon admission.   

## 2018-05-20 NOTE — Progress Notes (Signed)
Social Work  Social Work Assessment and Plan  Patient Details  Name: Richard Wang MRN: [id number] Date of Birth: [birth year]  Today's Date: 05/20/2018  Problem List:  Patient Active Problem List   Diagnosis Date Noted  . Urinary retention   . Chronic respiratory failure (HCC)   . Atrial fibrillation (HCC)   . Labile blood pressure   . Essential hypertension   . Steroid-induced hyperglycemia   . Normocytic anemia   . Acute on chronic respiratory failure (HCC)   . Leucocytosis   . Hypoalbuminemia due to protein-calorie malnutrition (HCC)   . Urinary frequency   . Debility 05/17/2018  . Acute and chronic respiratory failure with hypoxia (HCC)   . Acute systolic heart failure (HCC)   . Chronic atrial fibrillation   . Acute myocardial infarction, subendocardial infarction, subsequent episode of care (HCC)   . Unspecified septicemia(038.9) (HCC)   . Sepsis with acute organ dysfunction (HCC)   . Acute and chronic respiratory failure with hypoxia [facility])    Past Medical History:  Past Medical History:  Diagnosis Date  . Acute and chronic respiratory failure with hypoxia (HCC)   . Acute myocardial infarction, subendocardial infarction, subsequent episode of care (HCC)   . Acute systolic heart failure (HCC)   . Atrial fibrillation (HCC)   . Chronic atrial fibrillation   . Crohn's colitis (HCC)   . Diabetes mellitus without complication (HCC)   . Hypertension   . Sepsis with acute organ dysfunction (HCC)   . Unspecified septicemia(038.9) (HCC)    Past Surgical History: History reviewed. No pertinent surgical history. Social History:  reports that he has been smoking. He has been smoking about 1.50 packs per day. He has never used smokeless tobacco. He reports previous drug use. No history on file for alcohol.  Family / Support Systems Marital Status: Widow/Widower How Long?: 2007 Patient Roles: Parent Children: (pt reports he has one son living in [place], however, has no contact  with him) Other Supports: sister, Dimas Millin @ [phone];  Orlie Dakin, Jana Hakim (lives close by) @ [phone] Anticipated Caregiver: niece and sister Ability/Limitations of Caregiver: this neice does not live with pt and her parents Caregiver Availability: 24/7 Family Dynamics: Pt reports he has been living with sister and her family for ~ 4 yrs.  Good relationship with all.  Notes neice, Danielle, "... is the one who keeps [place] all straight"  Social History Preferred language: English Religion:  Cultural Background: NA Read: Yes Write: Yes Employment Status: Retired Marine scientist Issues: None Guardian/Conservator: None - per MD, pt is capable of making decisions on his own behalf.   Abuse/Neglect Abuse/Neglect Assessment Can Be Completed: Yes Physical Abuse: Denies Verbal Abuse: Denies Sexual Abuse: Denies Exploitation of patient/patient's resources: Denies Self-Neglect: Denies  Emotional Status Pt's affect, behavior and adjustment status: Pt sitting up in w/c, very pleasant but admits he is very frustration with his situation and limitations.  EAger to regain strength and return home.  Pt denies any significant emotional distress, however, may benefit from neuropsychology support while here.  Will monitor. Recent Psychosocial Issues: Pt living in a mobile home with 3 other family members and all with chronic health issues.  They are able to care for themselves individually. Psychiatric History: None Substance Abuse History: None  Patient / Family Perceptions, Expectations & Goals Pt/Family understanding of illness & functional limitations: Pt and family with good, general understanding of his medical course and of current functional limitations/ need for CIR. Premorbid pt/family roles/activities:  Pt was independent overall, however, limited mobility at home / community. Anticipated changes in roles/activities/participation: Little change anticipated if pt  able to reach supervison goals. Pt/family expectations/goals: "I just need to get my strength back."  [employer]: None Premorbid Home Care/DME Agencies: None Transportation available at discharge: yes Resource referrals recommended: Neuropsychology  Discharge Planning Living Arrangements: Other relatives Support Systems: Other relatives Type of Residence: Private residence Insurance Resources: Electrical engineer Resources: Restaurant manager, fast food Screen Referred: No Living Expenses: Lives with family Money Management: Patient, Family Does the patient have any problems obtaining your medications?: No Home Management: pt and family share responsibilities Patient/Family Preliminary Plans: Pt plans to return home with family.  Veatrice Kells, to be primary support to the entire household. Social Work Anticipated Follow Up Needs: HH/OP Expected length of stay: 18-21 days  Clinical Impression Pleasant gentleman here following resp failure and lengthy hospitalization.  Living in mobile home with 3 other adult family members who are also with chronic health issues.  Local niece provides oversight and assist to them.  Pt feels he will have all needed help he needs.  Denies any significant emotional distress, however, affect is rather flat.  May benefit from neuropsychology support.  Will monitor.  Vienne Corcoran 05/20/2018, 2:26 PM

## 2018-05-20 NOTE — Progress Notes (Signed)
Physical Therapy Session Note  Patient Details  Name: Richard Wang MRN: [id number] Date of Birth: [birth year]  Today's Date: 05/20/2018 PT Individual Time: 0800-0856 AND 1330-1355 PT Individual Time Calculation (min): 56 min AND 25 min  Short Term Goals: Week 1:  PT Short Term Goal 1 (Week 1): Pt will perform sit<>stand transfer w/ mod assist  PT Short Term Goal 2 (Week 1): Pt will perform bed<>chair transfer w/ min assist PT Short Term Goal 3 (Week 1): Pt will ambulate 15' w/ LRAD w/ min assist PT Short Term Goal 4 (Week 1): Pt will participate in 30 min of OOB activity w/o increase in fatigue  Skilled Therapeutic Interventions/Progress Updates:   Session 1:  Pt in supine and agreeable to therapy, denies pain at rest. Donned TEDs w/ total assist and transferred to EOB w/ supervision and increased time. Mod-max assist squat pivot to w/c. Pt self-propelled w/c to/from therapy gym w/ BUEs to work on endurance training, occasional brief rest break, otherwise went >150'. NuStep @ level 2, 5 min x2, for global strengthening, gentle ROM of BLEs to relieve tightness, and endurance training. Mild increase in work of breathing throughout session, pt denies SOB. Breathing returns to baseline w/ 30-60 sec rest. Lower back pain 5/10 w/ activity. Returned to room and ended session in supine, all needs in reach.   Session 2:  Pt in supine and agreeable to therapy, reports the hot packs on his back cut his pain in half. Asked PA-C for kpad order for continuous heat that pt can control for pain relief. Session focused on functional endurance w/ gait. Supervision bed mobility and ambulated 15-20' x3 w/ close w/c follow for safety and CGA-min assist. Decreased pain back in stance compared to previous sessions with this therapist. Returned to room via w/c, ended session in supine and all needs in reach. Hot packs re-applied to back.   Therapy Documentation Precautions:  Precautions Precautions:  Fall Restrictions Weight Bearing Restrictions: No Vital Signs: Therapy Vitals Temp: 98 F (36.7 C) Temp Source: Oral Pulse Rate: 74 Resp: 19 BP: (!) 157/82 Patient Position (if appropriate): Lying Oxygen Therapy SpO2: 100 % O2 Device: Room Air  Therapy/Group: Individual Therapy  Kadance Mccuistion K Kayleana Waites 05/20/2018, 8:59 AM

## 2018-05-20 NOTE — Progress Notes (Signed)
Occupational Therapy Session Note  Patient Details  Name: Richard Wang MRN: [id number] Date of Birth: 1946-01-04  Today's Date: 05/20/2018 OT Individual Time: 1000-1045 OT Individual Time Calculation (min): 45 min  and Today's Date: 05/20/2018 OT Missed Time: 15 Minutes Missed Time Reason: Pain;Patient fatigue    Short Term Goals: Week 1:  OT Short Term Goal 1 (Week 1): Pt will perform toileting with max A overall to decrease A with self care. OT Short Term Goal 2 (Week 1): Pt will peform LB dressing with min A for standing balance during clothing management.  OT Short Term Goal 3 (Week 1): Pt will tolerate standing for 5 minutes to complete self care tasks.  Skilled Therapeutic Interventions/Progress Updates:    Pt greeted semi-reclined in bed and agreeable to OT treatment session. Pt reports fatigue and back pain this morning limiting participation. Pt agreeable to changing clothes at EOB, but declined bathing or shower. Pt came to sitting EOB with increased time and min A. Pt utilized leaning method to doff pants, then needed OT assist to thread new pants. Sit<>stand with max A initially, then pt unable to remove unilateral UE from RW to assist with pulling up pants. OT provided pt with shampoo cap and pt able to maintain sitting balance while using B UEs to wash hair with CGA and slight posterior bias. Set-up A to don shirt in sitting. Pt completed 5 more sit<>stands with verbal cues for hand placement and overall mod A with 1 stand at min A. Focus on anterior weight shift when standing. Practiced side stepping along EOB w/ RW and mod A. Pt then completed LB there-ex seated EOB 2 sets of 10 ankle pumps and seated hip extension. Pt needed assistance with LLE ankle pumps 2/2 foot drop. Session ended early 2/2 pain and pt fatigue. OT provided pt with heat packs to back for pain management and informed nursing of pt status with pain. Bed alarm on and needs met.  Therapy  Documentation Precautions:  Precautions Precautions: Fall Restrictions Weight Bearing Restrictions: No General: General OT Amount of Missed Time: 15 Minutes Pain: Pain Assessment Pain Scale: 0-10 Pain Score: 8  Pain Type: Chronic pain Pain Location: Back Pain Orientation: Lower Pain Descriptors / Indicators: Aching Pain Frequency: Intermittent Pain Onset: With Activity Pain Intervention(s): Heat applied;Repositioned;RN made aware   Therapy/Group: Individual Therapy  Valma Cava 05/20/2018, 11:02 AM

## 2018-05-20 NOTE — Progress Notes (Signed)
[facility] PHYSICAL MEDICINE & REHABILITATION PROGRESS NOTE   Subjective/Complaints: Patient seen laying in bed this morning.  He states he did not sleep well overnight due to frequent urination.  Discussed with nursing, PVRs ordered.  CBGs also discussed with nursing.  States that he would like his diet advanced.  He also states that he was told not to be on aspirin due to GI bleed of unknown cause.  ROS: + Frequent urination.  Denies dysuria, CP, SOB< N/V/D  Objective:   No results found. Recent Labs    05/17/18 1615 05/20/18 0554  WBC 10.7* 11.6*  HGB 8.1* 8.8*  HCT 27.2* 28.3*  PLT 187 197   Recent Labs    05/17/18 1615 05/20/18 0554  NA  --  142  K  --  3.8  CL  --  106  CO2  --  27  GLUCOSE  --  83  BUN  --  15  CREATININE 1.29* 1.19  CALCIUM  --  8.9    Intake/Output Summary (Last 24 hours) at 05/20/2018 0911 Last data filed at 05/20/2018 0540 Gross per 24 hour  Intake 810 ml  Output 1200 ml  Net -390 ml     Physical Exam: Vital Signs Blood pressure (!) 157/82, pulse 74, temperature 98 F (36.7 C), temperature source Oral, resp. rate 19, SpO2 100 %. Constitutional: No distress . Vital signs reviewed. HENT: Normocephalic.  Atraumatic. Eyes: EOMI. No discharge. Cardiovascular: Irregularly irregular. No JVD. Respiratory: CTA Bilaterally. Normal effort. GI: BS +. Non-distended. Musc: No edema or tenderness in extremities. Neurologic: Alert and oriented Motor: Grossly 4-/5 throughout, except for left ankle dorsiflexion 0/5  Skin: Warm and dry.  Intact.  Assessment/Plan: 1. Functional deficits secondary to debility with bilalteral foot drop which require 3+ hours per day of interdisciplinary therapy in a comprehensive inpatient rehab setting.  Physiatrist is providing close team supervision and 24 hour management of active medical problems listed below.  Physiatrist and rehab team continue to assess barriers to discharge/monitor patient progress toward  functional and medical goals  Care Tool:  Bathing    Body parts bathed by patient: Right arm, Left arm, Chest, Abdomen, Front perineal area, Right upper leg, Left upper leg, Face   Body parts bathed by helper: Buttocks, Right lower leg, Left lower leg     Bathing assist Assist Level: Moderate Assistance - Patient 50 - 74%     Upper Body Dressing/Undressing Upper body dressing   What is the patient wearing?: Pull over shirt    Upper body assist Assist Level: Minimal Assistance - Patient > 75%    Lower Body Dressing/Undressing Lower body dressing      What is the patient wearing?: Pants     Lower body assist Assist for lower body dressing: Moderate Assistance - Patient 50 - 74%     Toileting Toileting    Toileting assist Assist for toileting: Minimal Assistance - Patient > 75%     Transfers Chair/bed transfer  Transfers assist     Chair/bed transfer assist level: Moderate Assistance - Patient 50 - 74%     Locomotion Ambulation   Ambulation assist   Ambulation activity did not occur: Safety/medical concerns          Walk 10 feet activity   Assist  Walk 10 feet activity did not occur: Safety/medical concerns        Walk 50 feet activity   Assist Walk 50 feet with 2 turns activity did not occur: Safety/medical concerns  Walk 150 feet activity   Assist Walk 150 feet activity did not occur: Safety/medical concerns         Walk 10 feet on uneven surface  activity   Assist Walk 10 feet on uneven surfaces activity did not occur: Safety/medical concerns         Wheelchair     Assist Will patient use wheelchair at discharge?: (TBD) Type of Wheelchair: Manual    Wheelchair assist level: Supervision/Verbal cueing Max wheelchair distance: 150'    Wheelchair 50 feet with 2 turns activity    Assist        Assist Level: Supervision/Verbal cueing   Wheelchair 150 feet activity     Assist     Assist  Level: Supervision/Verbal cueing    Medical Problem List and Plan: 1.Debilitysecondary to acute on chronic respiratory failure.  Continue CIR  PRAFO LLE, likely will need AFO  Notes reviewed 2. Antithrombotics: -DVT/anticoagulation:Subcutaneous heparin -antiplatelet therapy: Aspirin 81 mg daily DC'd on 3/30 due to patient stating he was told to avoid aspirin and other anticoagulants due to GI bleed 3. Pain Management:Tylenol as needed  Chronic low back pain- tylenol not helping ,added  Methocarbamol with benefit 4. Mood:Provide emotional support -antipsychotic agents: N/A 5. Neuropsych: This patientiscapable of making decisions on hisown behalf. 6. Skin/Wound Care:routine skin checks 7. Fluids/Electrolytes/Nutrition:routineI's and O's  -on D2 with honey liquids currently -supplement with HS IVF 1/2ns 50cc/hr, intake improved pt c/o freq urination at noc, patient states this is only because of his dentures, will follow-up with SLP 8. Atrial fibrillation. Cardizem 30 mg twice a day, metoprolol 12.5 mg twice a day.   Confirmed with most recent ECG, reviewed 9. Hypertension. Hydralazine 20 mg every 8 hours, Norvasc 5 mg daily Vitals:   05/19/18 1954 05/20/18 0528  BP: 138/77 (!) 157/82  Pulse: 78 74  Resp: 18 19  Temp: 98.3 F (36.8 C) 98 F (36.7 C)  SpO2: 100% 100%   Labile on 3/30 10.  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  CBG (last 3)  Recent Labs    05/17/18 1641  GLUCAP 142*   CBGs ordered  Will order hemoglobin A1c with next set of labs  Monitor with steroid taper 11. Hyperlipidemia. Lipitor 12. Acute on chronic respiratory failure: Slow taper, will decrease again on 4/1 13.  Anemia normocytic  Hemoglobin 8.8 on 3/30  Continue to monitor 14.  Hypoalbuminemia  Supplement initiated on 3/30 15.  Leukocytosis-likely  steroid-induced  WBCs 11.6 on 3/30  Afebrile 16.  Urinary frequency  Continue Flomax  PVRs ordered  LOS: 3 days A FACE TO FACE EVALUATION WAS PERFORMED   Karis Juba 05/20/2018, 9:11 AM

## 2018-05-20 NOTE — Progress Notes (Signed)
Speech Language Pathology Daily Session Note  Patient Details  Name: Richard Wang MRN: [id number] Date of Birth: 07-Jan-1946  Today's Date: 05/20/2018 SLP Individual Time: 1100-1200 SLP Individual Time Calculation (min): 60 min  Short Term Goals: Week 1: SLP Short Term Goal 1 (Week 1): Pt will consume trials of upgraded diet textures with minimal overt s/s of dysphagia or aspiration for 3 sessions prior to upgrade.  SLP Short Term Goal 1 - Progress (Week 1): Updated due to goal met SLP Short Term Goal 2 (Week 1): Pt will utilize external memory aids and compensatory strategies to recall new, daily information with Mod I.  SLP Short Term Goal 2 - Progress (Week 1): Updated due to goal met SLP Short Term Goal 3 (Week 1): Pt will complete semi-complex money and medication management tasks with Mod I.  SLP Short Term Goal 3 - Progress (Week 1): Updated due to goal met SLP Short Term Goal 4 (Week 1): Pt will demonstrate anticipatory awareness by listing hypothetical safe and unsafe activities within home environment with Mod I.  SLP Short Term Goal 4 - Progress (Week 1): Updated due to goal met SLP Short Term Goal 5 (Week 1): Pt will consume current diet without overt s/s aspiration with Mod I use of compensatory swallow strategies (i.e., sitting upright out of bed or edge of bed).  SLP Short Term Goal 5 - Progress (Week 1): Updated due to goal met SLP Short Term Goal 6 (Week 1): Pt will utilizie an increased vocal intensity in simple conversation to achieve 95% intelligiblity in moderately nosiy environment with supervison A verbal cues. SLP Short Term Goal 6 - Progress (Week 1): Met  Skilled Therapeutic Interventions:  Skilled treatment session #1 focused on cognition goals. SLP facilitated session by administering [place] d/t pt positioning in bed from back pain. Pt obtained score of 18 out of 22 (n=>22) indicating functional ability. Pt with difficulty recalling words but was able to  recall with slight question cues. Pt reports history of mild memory deficits at baseline. Per chart review, pt didn't complete semi-complex to complex cognitive tasks. Despite functional score, would recommend targeting money and medication management as well as use of compensatory memory strategies to increase safety at discharge. Goals have been changed to reflect current areas of deficit. Additionally, pt's vocal intensity is functional at conversational level with moderate noise in hallway. At this time, don't anticipate that pt will require any follow up ST services.   Skilled treatment session #2 focused on dysphagia goals. This Probation officer received copy of MBS with no indication of aspiration. Skilled observation of pt consuming 24 oz of thin liquids via straw was observed. Pt with no overt s/s of aspiration, no change in vitals, no change in vocal quality. Additionally, pt consumed trials of graham crackers with last 12 oz thin liquids. Pt with one mild throat clear with cracker. Will upgrade to thin liquids with further trials of advanced diet textures administered at bedside prior to upgrade to dysphagia 3. Education provided to pt to sit edge of bed or be out of bed when consuming meals/PO intake. All education completed.      Pain Pain Assessment Pain Scale: 0-10 Pain Score: 0-No pain  Therapy/Group: Individual Therapy  Kaidan Harpster 05/20/2018, 3:37 PM

## 2018-05-21 ENCOUNTER — Inpatient Hospital Stay (HOSPITAL_COMMUNITY): Payer: Medicare Other | Admitting: Physical Therapy

## 2018-05-21 ENCOUNTER — Inpatient Hospital Stay (HOSPITAL_COMMUNITY): Payer: Medicare Other | Admitting: Speech Pathology

## 2018-05-21 ENCOUNTER — Inpatient Hospital Stay (HOSPITAL_COMMUNITY): Payer: Medicare Other | Admitting: Occupational Therapy

## 2018-05-21 DIAGNOSIS — R339 Retention of urine, unspecified: Secondary | ICD-10-CM

## 2018-05-21 LAB — GLUCOSE, CAPILLARY
GLUCOSE-CAPILLARY: 83 mg/dL (ref 70–99)
Glucose-Capillary: 102 mg/dL — ABNORMAL HIGH (ref 70–99)
Glucose-Capillary: 165 mg/dL — ABNORMAL HIGH (ref 70–99)
Glucose-Capillary: 167 mg/dL — ABNORMAL HIGH (ref 70–99)

## 2018-05-21 MED ORDER — BETHANECHOL CHLORIDE 10 MG PO TABS
10.0000 mg | ORAL_TABLET | Freq: Every day | ORAL | Status: DC
  Administered 2018-05-21: 10 mg via ORAL
  Filled 2018-05-21: qty 1

## 2018-05-21 NOTE — Progress Notes (Signed)
Physical Therapy Session Note  Patient Details  Name: Richard Wang MRN: [id number] Date of Birth: 1945/06/10  Today's Date: 05/21/2018 PT Individual Time: 0805-0910 PT Individual Time Calculation (min): 65 min   Short Term Goals: Week 1:  PT Short Term Goal 1 (Week 1): Pt will perform sit<>stand transfer w/ mod assist  PT Short Term Goal 2 (Week 1): Pt will perform bed<>chair transfer w/ min assist PT Short Term Goal 3 (Week 1): Pt will ambulate 15' w/ LRAD w/ min assist PT Short Term Goal 4 (Week 1): Pt will participate in 30 min of OOB activity w/o increase in fatigue  Skilled Therapeutic Interventions/Progress Updates: Pt presented in bed agreeable to therapy. Pt stating pain 4/10 in low back but declining additional meds at this time. PTA spoke with nsg re: follow up for [facility]. PTA donned TED hose and traction socks total A for time management. Pt performed bed mobility with supervsion and increased time with use of bed features. Pt requesting to use BSC. Performed STS with RW modA and performed stand pivot transfer to [facility] with CGA and verbal cues for hand placement (+void). Pt required x 3 attempts to perform STS from [facility] with RW due to decreased forward flexion and pt's feet slipping forward with grip socks. PTA changed socks and pt was successful on 3rd attempt. Pt propelled to rehab gym supervision and performed stand pivot with modA for STS to mat. Continued to practice STS x 4 from mat progressing to minA with RW. Pt participated in seated LE therex and PTA provided HEP with additional exercises. Performed stand pivot to w/c and pt propelled back to room supervision level. Pt agreeable to stay in w/c and PTA provided hot pack with nsg notified. Pt left in w/c with call bell within reach and needs met.   Therex performed 15-20 reps Ankle pumps LAQ Hip flexion Hip abd/add     Therapy Documentation Precautions:  Precautions Precautions: Fall Restrictions Weight Bearing  Restrictions: No General:   Vital Signs: Therapy Vitals Temp: 98.1 F (36.7 C) Pulse Rate: (!) 152 BP: (!) 123/95 Patient Position (if appropriate): Sitting Oxygen Therapy SpO2: 100 %  Therapy/Group: Individual Therapy  Mckena Chern  Jmya Uliano, PTA  05/21/2018, 4:28 PM

## 2018-05-21 NOTE — Progress Notes (Signed)
Occupational Therapy Session Note  Patient Details  Name: Richard Wang MRN: [id number] Date of Birth: [birth year]  Today's Date: 05/21/2018 OT Individual Time: 1300-1410 OT Individual Time Calculation (min): 70 min   Short Term Goals: Week 1:  OT Short Term Goal 1 (Week 1): Pt will perform toileting with max A overall to decrease A with self care. OT Short Term Goal 2 (Week 1): Pt will peform LB dressing with min A for standing balance during clothing management.  OT Short Term Goal 3 (Week 1): Pt will tolerate standing for 5 minutes to complete self care tasks.  Skilled Therapeutic Interventions/Progress Updates:    Pt greeted semi-reclined in bed and agreeable to OT treatment session, although, stating he was very tired from earlier therapies. Pt declined any bathing/dressing tasks but said he would shower tomorrow. Pt had not eaten lunch yet, and was agreeable to come to sit EOB to eat. Pt completed bed mobility with min A. Pt able to use B UEs to cut meat, and self-feed without OT assist. Worked on sitting balance and upright tolerance while eating. Supervision for balance with no overt LOB noted. Discussed dc plan with pt who states his sister and brother-in-law live with him and will be there to provide support as needed. Pt completed 10 sit<>stands ranging from min to mod A with verbal cues for hand placement on RW. Pt needed extended rest breaks after each stand. Nursing entered and took pt vitals during rest break. Pt then issued level 2 orange thera-band and completed 3 sets of 10, bicep curls, triceps press, and lat pulls, while seated EOB. Pt returned to supine and left semi-reclined in bed with bed alarm on and needs met.   Therapy Documentation Precautions:  Precautions Precautions: Fall Restrictions Weight Bearing Restrictions: No Pain: Pain Assessment Pain Scale: 0-10 Pain Score: 5  Pain Intervention(s): Heat applied(k-pad)   Therapy/Group: Individual  Therapy  Valma Cava 05/21/2018, 1:15 PM

## 2018-05-21 NOTE — Progress Notes (Signed)
[facility] PHYSICAL MEDICINE & REHABILITATION PROGRESS NOTE   Subjective/Complaints: Patient seen laying in bed this morning.  He states he did not sleep well overnight due to urinary frequency.  He also complains of back pain.  ROS: + Frequent urination, chronic back pain.  Denies dysuria, CP, SOB, N/V/D  Objective:   No results found. Recent Labs    05/20/18 0554  WBC 11.6*  HGB 8.8*  HCT 28.3*  PLT 197   Recent Labs    05/20/18 0554  NA 142  K 3.8  CL 106  CO2 27  GLUCOSE 83  BUN 15  CREATININE 1.19  CALCIUM 8.9    Intake/Output Summary (Last 24 hours) at 05/21/2018 9381 Last data filed at 05/21/2018 0753 Gross per 24 hour  Intake 240 ml  Output 3611 ml  Net -3371 ml     Physical Exam: Vital Signs Blood pressure (!) 144/88, pulse 81, temperature 98.2 F (36.8 C), temperature source Oral, resp. rate 18, SpO2 100 %. Constitutional: No distress . Vital signs reviewed. HENT: Normocephalic.  Atraumatic. Eyes: EOMI. No discharge. Cardiovascular: Irregularly irregular. No JVD. Respiratory: CTA bilaterally. Normal effort. GI: BS +. Non-distended. Musc: No edema or tenderness in extremities. Neurologic: Alert and oriented Motor: Grossly 4-/5 throughout, except for left ankle dorsiflexion 0/5, stable Skin: Warm and dry.  Intact.  Assessment/Plan: 1. Functional deficits secondary to debility with bilalteral foot drop which require 3+ hours per day of interdisciplinary therapy in a comprehensive inpatient rehab setting.  Physiatrist is providing close team supervision and 24 hour management of active medical problems listed below.  Physiatrist and rehab team continue to assess barriers to discharge/monitor patient progress toward functional and medical goals  Care Tool:  Bathing    Body parts bathed by patient: Right arm, Left arm, Chest, Abdomen, Front perineal area, Right upper leg, Left upper leg, Face   Body parts bathed by helper: Buttocks, Right lower  leg, Left lower leg     Bathing assist Assist Level: Moderate Assistance - Patient 50 - 74%     Upper Body Dressing/Undressing Upper body dressing   What is the patient wearing?: Pull over shirt    Upper body assist Assist Level: Minimal Assistance - Patient > 75%    Lower Body Dressing/Undressing Lower body dressing      What is the patient wearing?: Pants     Lower body assist Assist for lower body dressing: Moderate Assistance - Patient 50 - 74%     Toileting Toileting    Toileting assist Assist for toileting: Minimal Assistance - Patient > 75%     Transfers Chair/bed transfer  Transfers assist     Chair/bed transfer assist level: Maximal Assistance - Patient 25 - 49%     Locomotion Ambulation   Ambulation assist   Ambulation activity did not occur: Safety/medical concerns  Assist level: Minimal Assistance - Patient > 75% Assistive device: Walker-rolling Max distance: 20'   Walk 10 feet activity   Assist  Walk 10 feet activity did not occur: Safety/medical concerns  Assist level: Minimal Assistance - Patient > 75% Assistive device: Walker-rolling   Walk 50 feet activity   Assist Walk 50 feet with 2 turns activity did not occur: Safety/medical concerns         Walk 150 feet activity   Assist Walk 150 feet activity did not occur: Safety/medical concerns         Walk 10 feet on uneven surface  activity   Assist Walk 10 feet  on uneven surfaces activity did not occur: Safety/medical concerns         Wheelchair     Assist Will patient use wheelchair at discharge?: (TBD) Type of Wheelchair: Manual    Wheelchair assist level: Supervision/Verbal cueing Max wheelchair distance: 150'    Wheelchair 50 feet with 2 turns activity    Assist        Assist Level: Supervision/Verbal cueing   Wheelchair 150 feet activity     Assist     Assist Level: Supervision/Verbal cueing    Medical Problem List and  Plan: 1.Debilitysecondary to acute on chronic respiratory failure.  Continue CIR  PRAFO LLE, likely will need AFO 2. Antithrombotics: -DVT/anticoagulation:Subcutaneous heparin -antiplatelet therapy: Aspirin 81 mg daily DC'd on 3/30 due to patient stating he was told to avoid aspirin and other anticoagulants due to GI bleed.  Pharmacy contacted-no results after extensive review, please see pharmacy note. 3. Pain Management:Tylenol as needed  Chronic low back pain- tylenol not helping ,added  Methocarbamol with benefit.  K pad ordered 4. Mood:Provide emotional support -antipsychotic agents: N/A 5. Neuropsych: This patientiscapable of making decisions on hisown behalf. 6. Skin/Wound Care:routine skin checks 7. Fluids/Electrolytes/Nutrition:routineI's and O's  -on D2 with honey liquids, advanced to D2 thins -supplement with HS IVF 1/2ns 50cc/hr DC'd 8. Atrial fibrillation. Cardizem 30 mg twice a day, metoprolol 12.5 mg twice a day.   Confirmed with most recent ECG, reviewed 9. Hypertension. Hydralazine 20 mg every 8 hours, Norvasc 5 mg daily Vitals:   05/20/18 2011 05/21/18 0601  BP: (!) 145/84 (!) 144/88  Pulse: 82 81  Resp: 14 18  Temp: 98.2 F (36.8 C) 98.2 F (36.8 C)  SpO2: 98% 100%   Relatively controlled on 3/31 10.  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  CBG (last 3)  Recent Labs    05/20/18 1636 05/20/18 2104 05/21/18 0620  GLUCAP 199* 153* 83   CBGs labile on 3/31  Will order hemoglobin A1c with next set of labs  Monitor with steroid taper 11. Hyperlipidemia. Lipitor 12. Acute on chronic respiratory failure: Slow taper, will decrease again on 4/1 13.  Anemia normocytic  Hemoglobin 8.8 on 3/30  Continue to monitor 14.  Hypoalbuminemia  Supplement initiated on 3/30 15.  Leukocytosis-likely steroid-induced  WBCs 11.6 on  3/30  Afebrile 16.  Urinary frequency, predominantly at night  Continue Flomax  PVRs suggesting retention  Bethanechol 10 nightly started on 3/31  LOS: 4 days A FACE TO FACE EVALUATION WAS PERFORMED  Erlinda Solinger Karis Juba 05/21/2018, 8:22 AM

## 2018-05-21 NOTE — Plan of Care (Signed)
  Problem: Consults Goal: RH GENERAL PATIENT EDUCATION Description See Patient Education module for education specifics. Outcome: Progressing Goal: Diabetes Guidelines if Diabetic/Glucose > 140 Description If diabetic or lab glucose is > 140 mg/dl - Initiate Diabetes/Hyperglycemia Guidelines & Document Interventions  Outcome: Progressing   Problem: RH BLADDER ELIMINATION Goal: RH STG MANAGE BLADDER WITH ASSISTANCE Description STG Manage Bladder With min Assistance  Outcome: Progressing   Problem: RH SKIN INTEGRITY Goal: RH STG SKIN FREE OF INFECTION/BREAKDOWN Description Patients skin will remain free from further infection or breakdown with mod I assist.     Outcome: Progressing Goal: RH STG MAINTAIN SKIN INTEGRITY WITH ASSISTANCE Description STG Maintain Skin Integrity With mod I Assistance.  Outcome: Progressing Goal: RH STG ABLE TO PERFORM INCISION/WOUND CARE W/ASSISTANCE Description STG Able To Perform Incision/Wound Care With mod I Assistance.  Outcome: Progressing   Problem: RH SAFETY Goal: RH STG ADHERE TO SAFETY PRECAUTIONS W/ASSISTANCE/DEVICE Description STG Adhere to Safety Precautions With mod I Assistance/Device.  Outcome: Progressing   Problem: RH BOWEL ELIMINATION Goal: RH STG MANAGE BOWEL WITH ASSISTANCE Description STG Manage Bowel with min Assistance.  Outcome: Progressing

## 2018-05-21 NOTE — Care Management (Signed)
[facility] Individual Statement of Services  Patient Name:  BJORN TETLOW  Date:  05/21/2018  Welcome to the [facility].  Our goal is to provide you with an individualized program based on your diagnosis and situation, designed to meet your specific needs.  With this comprehensive rehabilitation program, you will be expected to participate in at least 3 hours of rehabilitation therapies Monday-Friday, with modified therapy programming on the weekends.  Your rehabilitation program will include the following services:  Physical Therapy (PT), Occupational Therapy (OT), Speech Therapy (ST), 24 hour per day rehabilitation nursing, Therapeutic Recreaction (TR), Neuropsychology, Case Management (Social Worker), Rehabilitation Medicine, Nutrition Services and Pharmacy Services  Weekly team conferences will be held on Wednesdays to discuss your progress.  Your Social Worker will talk with you frequently to get your input and to update you on team discussions.  Team conferences with you and your family in attendance may also be held.  Expected length of stay: 18-21 days   Overall anticipated outcome: supervision  Depending on your progress and recovery, your program may change. Your Social Worker will coordinate services and will keep you informed of any changes. Your Social Worker's name and contact numbers are listed  below.  The following services may also be recommended but are not provided by the [facility]:   Driving Evaluations  Home Health Rehabiltiation Services  Outpatient Rehabilitation Services  Arrangements will be made to provide these services after discharge if needed.  Arrangements include referral to agencies that provide these services.  Your insurance has been verified to be:  Medicare Your primary doctor is:  Dr. Leola Brazil [facility], [name])  Pertinent information will be shared with your doctor and your insurance  company.  Social Worker:  [place], [place] [phone] or ([phone]   Information discussed with and copy given to patient by: Amada Jupiter, 05/21/2018, 11:15 AM

## 2018-05-21 NOTE — Progress Notes (Signed)
Speech Language Pathology Daily Session Note  Patient Details  Name: Richard Wang MRN: [id number] Date of Birth: March 23, 1945  Today's Date: 05/21/2018 SLP Individual Time: 1100-1155 SLP Individual Time Calculation (min): 55 min  Short Term Goals: Week 1: SLP Short Term Goal 1 (Week 1): Pt will consume trials of upgraded diet textures with minimal overt s/s of dysphagia or aspiration for 3 sessions prior to upgrade.  SLP Short Term Goal 1 - Progress (Week 1): Updated due to goal met SLP Short Term Goal 2 (Week 1): Pt will utilize external memory aids and compensatory strategies to recall new, daily information with Mod I.  SLP Short Term Goal 2 - Progress (Week 1): Updated due to goal met SLP Short Term Goal 3 (Week 1): Pt will complete semi-complex money and medication management tasks with Mod I.  SLP Short Term Goal 3 - Progress (Week 1): Updated due to goal met SLP Short Term Goal 4 (Week 1): Pt will demonstrate anticipatory awareness by listing hypothetical safe and unsafe activities within home environment with Mod I.  SLP Short Term Goal 4 - Progress (Week 1): Updated due to goal met SLP Short Term Goal 5 (Week 1): Pt will consume current diet without overt s/s aspiration with Mod I use of compensatory swallow strategies (i.e., sitting upright out of bed or edge of bed).  SLP Short Term Goal 5 - Progress (Week 1): Updated due to goal met SLP Short Term Goal 6 (Week 1): Pt will utilizie an increased vocal intensity in simple conversation to achieve 95% intelligiblity in moderately nosiy environment with supervison A verbal cues. SLP Short Term Goal 6 - Progress (Week 1): Met  Skilled Therapeutic Interventions: Skilled treatment session focused on cognitive goals. SLP facilitated session by providing Mod A verbal cues for recall of his current medications and their functions. Patient verbalized current system for managing multiple medications that must be administered multiple times a day  but also reported his system was not reliable. Recommend organization of QD pill box during next session. Patient perseverative on back pain and was transferred back to bed via the [facility] with Mod verbal cues needed for safety and sequencing. Patient left upright in bed with alarm on and all needs within reach. Continue with current plan of care.   Of note, patient consumed thin liquids via cup throughout session without overt s/s of aspiration. Recommend patient continue thin liquids.       Pain No/Denies Pain   Therapy/Group: Individual Therapy  Richard Wang 05/21/2018, 3:23 PM

## 2018-05-22 ENCOUNTER — Inpatient Hospital Stay (HOSPITAL_COMMUNITY): Payer: Medicare Other | Admitting: Speech Pathology

## 2018-05-22 ENCOUNTER — Inpatient Hospital Stay (HOSPITAL_COMMUNITY): Payer: Medicare Other | Admitting: Physical Therapy

## 2018-05-22 ENCOUNTER — Inpatient Hospital Stay (HOSPITAL_COMMUNITY): Payer: Medicare Other

## 2018-05-22 LAB — GLUCOSE, CAPILLARY
Glucose-Capillary: 81 mg/dL (ref 70–99)
Glucose-Capillary: 94 mg/dL (ref 70–99)
Glucose-Capillary: 206 mg/dL — ABNORMAL HIGH (ref 70–99)
Glucose-Capillary: 156 mg/dL — ABNORMAL HIGH (ref 70–99)

## 2018-05-22 MED ORDER — TAMSULOSIN HCL 0.4 MG PO CAPS
0.8000 mg | ORAL_CAPSULE | Freq: Every day | ORAL | Status: DC
  Administered 2018-05-23 – 2018-06-02 (×11): 0.8 mg via ORAL
  Filled 2018-05-22 (×11): qty 2

## 2018-05-22 MED ORDER — BETHANECHOL CHLORIDE 10 MG PO TABS
10.0000 mg | ORAL_TABLET | Freq: Three times a day (TID) | ORAL | Status: DC
  Administered 2018-05-22 – 2018-06-02 (×34): 10 mg via ORAL
  Filled 2018-05-22 (×34): qty 1

## 2018-05-22 MED ORDER — PREDNISONE 20 MG PO TABS
30.0000 mg | ORAL_TABLET | Freq: Every day | ORAL | Status: DC
  Administered 2018-05-22 – 2018-05-25 (×4): 30 mg via ORAL
  Filled 2018-05-22 (×5): qty 1

## 2018-05-22 NOTE — Progress Notes (Signed)
Speech Language Pathology Daily Session Note  Patient Details  Name: ALESSANDER SIKORSKI MRN: [id number] Date of Birth: 1946/02/06  Today's Date: 05/22/2018 SLP Individual Time: 2130-8657 SLP Individual Time Calculation (min): 32 min and Today's Date: 05/22/2018 SLP Missed Time: 13 Minutes Missed Time Reason: Nursing care  Short Term Goals: Week 1: SLP Short Term Goal 1 (Week 1): Pt will consume trials of upgraded diet textures with minimal overt s/s of dysphagia or aspiration for 3 sessions prior to upgrade.  SLP Short Term Goal 1 - Progress (Week 1): Updated due to goal met SLP Short Term Goal 2 (Week 1): Pt will utilize external memory aids and compensatory strategies to recall new, daily information with Mod I.  SLP Short Term Goal 2 - Progress (Week 1): Updated due to goal met SLP Short Term Goal 3 (Week 1): Pt will complete semi-complex money and medication management tasks with Mod I.  SLP Short Term Goal 3 - Progress (Week 1): Updated due to goal met SLP Short Term Goal 4 (Week 1): Pt will demonstrate anticipatory awareness by listing hypothetical safe and unsafe activities within home environment with Mod I.  SLP Short Term Goal 4 - Progress (Week 1): Updated due to goal met SLP Short Term Goal 5 (Week 1): Pt will consume current diet without overt s/s aspiration with Mod I use of compensatory swallow strategies (i.e., sitting upright out of bed or edge of bed).  SLP Short Term Goal 5 - Progress (Week 1): Updated due to goal met SLP Short Term Goal 6 (Week 1): Pt will utilizie an increased vocal intensity in simple conversation to achieve 95% intelligiblity in moderately nosiy environment with supervison A verbal cues. SLP Short Term Goal 6 - Progress (Week 1): Met  Skilled Therapeutic Interventions: Skilled treatment session focused on cognitive goals. Patient missed initial time of session due to RN providing a cath. SLP facilitated session by providing overall supervision level verbal  cues for functional problem solving during a basic money management task and with basic mathematical problems. Patient left upright in recliner with alarm on and all needs within reach. Continue with current plan of care.      Pain Pain Assessment Pain Scale: 0-10 Pain Score: 4  Pain Type: Chronic pain Pain Location: Back Pain Descriptors / Indicators: Aching Pain Frequency: Constant Pain Intervention(s): Medication (See eMAR);Repositioned  Therapy/Group: Individual Therapy  Lynsey Ange 05/22/2018, 12:36 PM

## 2018-05-22 NOTE — Plan of Care (Signed)
Due to the current state of emergency, patients may not be receiving their 3-hours of Medicare-mandated therapy.   

## 2018-05-22 NOTE — Progress Notes (Signed)
[place] PHYSICAL MEDICINE & REHABILITATION PROGRESS NOTE   Subjective/Complaints: Patient seen laying in bed this morning.  He states he did not sleep well overnight due to urinary frequency.  ROS: + Frequent urination, predominantly at night.  Denies dysuria, CP, SOB, N/V/D  Objective:   No results found. Recent Labs    05/20/18 0554  WBC 11.6*  HGB 8.8*  HCT 28.3*  PLT 197   Recent Labs    05/20/18 0554  NA 142  K 3.8  CL 106  CO2 27  GLUCOSE 83  BUN 15  CREATININE 1.19  CALCIUM 8.9    Intake/Output Summary (Last 24 hours) at 05/22/2018 0849 Last data filed at 05/22/2018 0742 Gross per 24 hour  Intake 480 ml  Output 1150 ml  Net -670 ml     Physical Exam: Vital Signs Blood pressure (!) 142/84, pulse 84, temperature 98 F (36.7 C), temperature source Oral, resp. rate 18, height 5\' 10"  (1.778 m), weight 71.6 kg, SpO2 100 %. Constitutional: No distress . Vital signs reviewed. HENT: Normocephalic.  Atraumatic. Eyes: EOMI. No discharge. Cardiovascular: Irregularly irregular.  No JVD. Respiratory: CTA bilaterally.  Normal effort. GI: BS +. Non-distended. Musc: No edema or tenderness in extremities. Neurologic: Alert and oriented Motor: Grossly 4-/5 throughout, except for left ankle dorsiflexion 1/5 Skin: Warm and dry.  Intact.  Assessment/Plan: 1. Functional deficits secondary to debility with bilalteral foot drop which require 3+ hours per day of interdisciplinary therapy in a comprehensive inpatient rehab setting.  Physiatrist is providing close team supervision and 24 hour management of active medical problems listed below.  Physiatrist and rehab team continue to assess barriers to discharge/monitor patient progress toward functional and medical goals  Care Tool:  Bathing    Body parts bathed by patient: Right arm, Left arm, Chest, Abdomen, Front perineal area, Right upper leg, Left upper leg, Face   Body parts bathed by helper: Buttocks, Right lower  leg, Left lower leg     Bathing assist Assist Level: Moderate Assistance - Patient 50 - 74%     Upper Body Dressing/Undressing Upper body dressing   What is the patient wearing?: Pull over shirt    Upper body assist Assist Level: Minimal Assistance - Patient > 75%    Lower Body Dressing/Undressing Lower body dressing      What is the patient wearing?: Pants     Lower body assist Assist for lower body dressing: Moderate Assistance - Patient 50 - 74%     Toileting Toileting    Toileting assist Assist for toileting: Minimal Assistance - Patient > 75%     Transfers Chair/bed transfer  Transfers assist     Chair/bed transfer assist level: Maximal Assistance - Patient 25 - 49%     Locomotion Ambulation   Ambulation assist   Ambulation activity did not occur: Safety/medical concerns  Assist level: Minimal Assistance - Patient > 75% Assistive device: Walker-rolling Max distance: 20'   Walk 10 feet activity   Assist  Walk 10 feet activity did not occur: Safety/medical concerns  Assist level: Minimal Assistance - Patient > 75% Assistive device: Walker-rolling   Walk 50 feet activity   Assist Walk 50 feet with 2 turns activity did not occur: Safety/medical concerns         Walk 150 feet activity   Assist Walk 150 feet activity did not occur: Safety/medical concerns         Walk 10 feet on uneven surface  activity   Assist Walk  10 feet on uneven surfaces activity did not occur: Safety/medical concerns         Wheelchair     Assist Will patient use wheelchair at discharge?: (TBD) Type of Wheelchair: Manual    Wheelchair assist level: Supervision/Verbal cueing Max wheelchair distance: 150'    Wheelchair 50 feet with 2 turns activity    Assist        Assist Level: Supervision/Verbal cueing   Wheelchair 150 feet activity     Assist     Assist Level: Supervision/Verbal cueing    Medical Problem List and  Plan: 1.Debilitysecondary to acute on chronic respiratory failure.  Continue CIR  PRAFO LLE, likely will need AFO  Team conference today to discuss current and goals and coordination of care, home and environmental barriers, and discharge planning with nursing, case manager, and therapies.  2. Antithrombotics: -DVT/anticoagulation:Subcutaneous heparin -antiplatelet therapy: Aspirin 81 mg daily DC'd on 3/30 due to patient stating he was told to avoid aspirin and other anticoagulants due to GI bleed.  Pharmacy contacted-no results after extensive review, please see pharmacy note. 3. Pain Management:Tylenol as needed  Chronic low back pain- tylenol not helping ,added  Methocarbamol with benefit.  K pad ordered 4. Mood:Provide emotional support -antipsychotic agents: N/A 5. Neuropsych: This patientiscapable of making decisions on hisown behalf. 6. Skin/Wound Care:routine skin checks 7. Fluids/Electrolytes/Nutrition:routineI's and O's   On D2 with honey liquids, advanced to D2 thins   Supplement with HS IVF 1/2ns 50cc/hr DC'd 8. Atrial fibrillation. Cardizem 30 mg twice a day, metoprolol 12.5 mg twice a day.   Confirmed with most recent ECG, reviewed 9. Hypertension. Hydralazine 20 mg every 8 hours, Norvasc 5 mg daily Vitals:   05/21/18 1931 05/22/18 0328  BP: 126/77 (!) 142/84  Pulse: 94 84  Resp: 18 18  Temp: 98.3 F (36.8 C) 98 F (36.7 C)  SpO2: 100% 100%   Relatively controlled on 4/1 10.  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  CBG (last 3)  Recent Labs    05/21/18 1632 05/21/18 2059 05/22/18 0553  GLUCAP 165* 167* 81   CBGs labile on 4/1  Will order hemoglobin A1c with next set of labs  Monitor with steroid taper 11. Hyperlipidemia. Lipitor 12. Acute on chronic respiratory failure: Slow taper, decreased to 30 on 4/1 13.  Anemia normocytic  Hemoglobin 8.8  on 3/30  Continue to monitor 14.  Hypoalbuminemia  Supplement initiated on 3/30 15.  Leukocytosis-likely steroid-induced  WBCs 11.6 on 3/30  Afebrile 16.  Urinary frequency, predominantly at night  Continue Flomax, increased on 4/1  PVRs suggesting retention  Bethanechol 10 nightly started on 3/31, increased to 3 times daily on 4/1  LOS: 5 days A FACE TO FACE EVALUATION WAS PERFORMED  Keena Dinse Karis Juba 05/22/2018, 8:49 AM

## 2018-05-22 NOTE — Progress Notes (Signed)
Occupational Therapy Session Note  Patient Details  Name: Richard Wang MRN: [id number] Date of Birth: 03/09/1945  Today's Date: 05/22/2018 OT Individual Time: 0800-0815 OT Individual Time Calculation (min): 15 min    Short Term Goals: Week 1:  OT Short Term Goal 1 (Week 1): Pt will perform toileting with max A overall to decrease A with self care. OT Short Term Goal 2 (Week 1): Pt will peform LB dressing with min A for standing balance during clothing management.  OT Short Term Goal 3 (Week 1): Pt will tolerate standing for 5 minutes to complete self care tasks.  Skilled Therapeutic Interventions/Progress Updates:    Attempted to see pt for morning session. Pt reporting unable to sleep the past few days, and declines intervention at this time.Followed up 60 min later and pt willing to dress EOB. Pt changes pants and socks with min A for sitting balance while threading BLE, MOD-MAX A sit to stand 2x for clothing management. Pt transfers into recliner with RW with MIN steady A for stepping. Pt changes shirt with S. Exited session with pt seated in recliner, call light in reach and all needs met  Therapy Documentation Precautions:  Precautions Precautions: Fall Restrictions Weight Bearing Restrictions: No General:   Vital Signs: Therapy Vitals Temp: 98 F (36.7 C) Temp Source: Oral Pulse Rate: 84 Resp: 18 BP: (!) 142/84 Patient Position (if appropriate): Lying Oxygen Therapy SpO2: 100 % O2 Device: Room Air   Therapy/Group: Individual Therapy   M  05/22/2018, 7:21 AM 

## 2018-05-22 NOTE — Progress Notes (Signed)
Physical Therapy Session Note  Patient Details  Name: Richard Wang MRN: [id number] Date of Birth: 10/21/1945  Today's Date: 05/22/2018 PT Individual Time: 0900-1000 PT Individual Time Calculation (min): 60 min   Short Term Goals: Week 1:  PT Short Term Goal 1 (Week 1): Pt will perform sit<>stand transfer w/ mod assist  PT Short Term Goal 2 (Week 1): Pt will perform bed<>chair transfer w/ min assist PT Short Term Goal 3 (Week 1): Pt will ambulate 15' w/ LRAD w/ min assist PT Short Term Goal 4 (Week 1): Pt will participate in 30 min of OOB activity w/o increase in fatigue  Skilled Therapeutic Interventions/Progress Updates:   Pt seated in recliner.  [name], RN giving meds.  Pt stated pain is 5/10 low back, chronic.  Medicated at start of session, and using K pad when in room.  Seated 10 x 2:  bil hip adductor squeezes, heel raises; 10 x 1 R/L long arc quad knee extensions with R ankle pumps.  L ankle PROM.   Sit> stand from recliner with min/mod assist, mod cues for technique.  Gait training with RW on level tile x 50' straight trajectory, x 37' with 2 turns, CGA, ACE on L ankle for foot drop.  Distances limited by low back pain.  Pt stated that ACE was helpful.  W/c propulsion using bil UEs x 150' for general activity tolerance, supervision.  Pt left seated in recliner with LEs elevated, with seat belt alarm set and needs at hand.     Therapy Documentation Precautions:  Precautions Precautions: Fall Restrictions Weight Bearing Restrictions: No      Therapy/Group: Individual Therapy  Ohmann,Muzamil Harker 05/22/2018, 10:52 AM

## 2018-05-22 NOTE — Progress Notes (Signed)
Physical Therapy Session Note  Patient Details  Name: Richard Wang MRN: [id number] Date of Birth: 1946-02-10  Today's Date: 05/22/2018 PT Individual Time: 1200-1255 PT Individual Time Calculation (min): 55 min   Short Term Goals: Week 1:  PT Short Term Goal 1 (Week 1): Pt will perform sit<>stand transfer w/ mod assist  PT Short Term Goal 2 (Week 1): Pt will perform bed<>chair transfer w/ min assist PT Short Term Goal 3 (Week 1): Pt will ambulate 15' w/ LRAD w/ min assist PT Short Term Goal 4 (Week 1): Pt will participate in 30 min of OOB activity w/o increase in fatigue  Skilled Therapeutic Interventions/Progress Updates:   Pt in supine and agreeable to therapy, pain as detailed below. Supervision bed mobility and min-mod assist squat pivot to w/c. Pt self-propelled w/c to/from therapy gym w/ supervision for endurance training. Focused on LE strengthening, performed BLE exercises as listed below. Sit<>stands from w/c w/ min assist, much improved since previous session with this therapist. NuStep 5 min x2 @ level for global strengthening and endurance training. Reports B gastroc tightness continues w/ exercises and while on NuStep. Returned to room and ended session sitting EOB w/ bed alarm engaged, all needs in reach.  Exercises: -partial knee bends 3x10 -LAQs 3x10 -seated heel raises 2x10 -hamstring curl w/ beige theraband 2x10  Therapy Documentation Precautions:  Precautions Precautions: Fall Restrictions Weight Bearing Restrictions: No Pain: Pain Assessment Pain Scale: 0-10 Pain Score: 4  Pain Type: Chronic pain Pain Location: Back Pain Descriptors / Indicators: Aching Pain Frequency: Constant Pain Intervention(s): Medication (See eMAR);Repositioned  Therapy/Group: Individual Therapy  Tatyanna Cronk K Lyle Niblett 05/22/2018, 1:04 PM

## 2018-05-22 NOTE — Plan of Care (Signed)
  Problem: Consults Goal: RH GENERAL PATIENT EDUCATION Description See Patient Education module for education specifics. Outcome: Progressing Goal: Diabetes Guidelines if Diabetic/Glucose > 140 Description If diabetic or lab glucose is > 140 mg/dl - Initiate Diabetes/Hyperglycemia Guidelines & Document Interventions  Outcome: Progressing   Problem: RH BLADDER ELIMINATION Goal: RH STG MANAGE BLADDER WITH ASSISTANCE Description STG Manage Bladder With min Assistance  Outcome: Progressing Flowsheets (Taken 05/22/2018 1445) STG: Pt will manage bladder with assistance: 1-Total assistance Note:  In and out cath prn   Problem: RH SKIN INTEGRITY Goal: RH STG SKIN FREE OF INFECTION/BREAKDOWN Description Patients skin will remain free from further infection or breakdown with mod I assist.     Outcome: Progressing Goal: RH STG ABLE TO PERFORM INCISION/WOUND CARE W/ASSISTANCE Description STG Able To Perform Incision/Wound Care With mod I Assistance.  Outcome: Progressing Flowsheets (Taken 05/22/2018 1445) STG: Pt will be able to perform incision/wound care with assistance: 5-Supervision/cueing   Problem: RH SAFETY Goal: RH STG ADHERE TO SAFETY PRECAUTIONS W/ASSISTANCE/DEVICE Description STG Adhere to Safety Precautions With mod I Assistance/Device.  Outcome: Progressing Flowsheets (Taken 05/22/2018 1445) STG:Pt will adhere to safety precautions with assistance/device: 4-Minimal assistance   Problem: RH BOWEL ELIMINATION Goal: RH STG MANAGE BOWEL WITH ASSISTANCE Description STG Manage Bowel with min Assistance.  Outcome: Progressing Flowsheets (Taken 05/22/2018 1445) STG: Pt will manage bowels with assistance: 6-Modified independent

## 2018-05-23 ENCOUNTER — Inpatient Hospital Stay (HOSPITAL_COMMUNITY): Payer: Medicare Other | Admitting: Occupational Therapy

## 2018-05-23 ENCOUNTER — Inpatient Hospital Stay (HOSPITAL_COMMUNITY): Payer: Medicare Other | Admitting: Speech Pathology

## 2018-05-23 ENCOUNTER — Inpatient Hospital Stay (HOSPITAL_COMMUNITY): Payer: Medicare Other | Admitting: Physical Therapy

## 2018-05-23 ENCOUNTER — Inpatient Hospital Stay (HOSPITAL_COMMUNITY): Payer: Medicare Other

## 2018-05-23 DIAGNOSIS — R7309 Other abnormal glucose: Secondary | ICD-10-CM

## 2018-05-23 LAB — GLUCOSE, CAPILLARY
Glucose-Capillary: 81 mg/dL (ref 70–99)
Glucose-Capillary: 100 mg/dL — ABNORMAL HIGH (ref 70–99)
Glucose-Capillary: 187 mg/dL — ABNORMAL HIGH (ref 70–99)
Glucose-Capillary: 171 mg/dL — ABNORMAL HIGH (ref 70–99)

## 2018-05-23 NOTE — Progress Notes (Signed)
Occupational Therapy Session Note  Patient Details  Name: Richard Wang MRN: [id number] Date of Birth: September 05, 1945  Today's Date: 05/23/2018  Session 1 OT Individual Time: 1901-2224 OT Individual Time Calculation (min): 40 min   Session 2 OT Individual Time: 1415-1445 OT Individual Time Calculation (min): 30 min   Short Term Goals: Week 1:  OT Short Term Goal 1 (Week 1): Pt will perform toileting with max A overall to decrease A with self care. OT Short Term Goal 2 (Week 1): Pt will peform LB dressing with min A for standing balance during clothing management.  OT Short Term Goal 3 (Week 1): Pt will tolerate standing for 5 minutes to complete self care tasks.  Skilled Therapeutic Interventions/Progress Updates:    Session 1 Pt greeted seated in recliner and agreeable to OT treatment session with encouragement. Pt declines bathing/dressing today despite encouragement, and agreeable to shower tomorrow. Pt does request to wash hair using shower cap and shave. OT provided pt with shower cap and worked on standing endurance and balance while completing task. Pt needed mod A to come to stand, then tolerated standing for 1 minute while washing hair. Pt took seated rest break then stood for another minute to comb out hair. Pt requests to shave sitting down 2/2 fatigue. Pt continues to state how weak he is and needs encouragement to see his progress. Pt left seated in wc at end of session handoff to PT for next therapy session.   Session 2 Pt greeted semi-reclined in bed resting and agreeable to OT treatment session. Pt came to sitting EOB with increased time and supervision. Sit<>stand from bed with RW and Mod A, then min A to ambulate 7 feet to wc w/ RW. B UE strengthening/coordination with wc propulsion to therapy apartment. Discussed home bathroom set-up and practiced tub bench transfer with min A in simulated home environment. Pt then ambulated 5 feet w/ RW and min A to transfer out of bathroom. Pt  propelled wc back to room with increased time and supervision. Pt left seated in wc with alarm belt on and needs met.   Therapy Documentation Precautions:  Precautions Precautions: Fall Restrictions Weight Bearing Restrictions: No Pain: Pain Assessment Pain Scale: 0-10 Pain Score: 3  Pain Type: Chronic pain Pain Location: Back Pain Orientation: Lower Pain Descriptors / Indicators: Aching Pain Onset: On-going Pain Intervention(s): Repositioned   Therapy/Group: Individual Therapy  Valma Cava 05/23/2018, 3:04 PM

## 2018-05-23 NOTE — Patient Care Conference (Signed)
Inpatient RehabilitationTeam Conference and Plan of Care Update Date: 05/22/2018   Time: 11:45 AM    Patient Name: Richard Wang      Medical Record Number: [id number]  Date of Birth: 1945/06/29 Sex: Male         Room/Bed: 4M04C/4M04C-01 Payor Info: Payor: MEDICARE / Plan: MEDICARE PART A AND B / Product Type: *No Product type* /    Admitting Diagnosis: Debility  Admit Date/Time:  05/17/2018  3:11 PM Admission Comments: No comment available   Primary Diagnosis:  <principal problem not specified> Principal Problem: <principal problem not specified>  Patient Active Problem List   Diagnosis Date Noted  . Urinary retention   . Chronic respiratory failure ([place])   . Atrial fibrillation ([place])   . Labile blood pressure   . Essential hypertension   . Steroid-induced hyperglycemia   . Normocytic anemia   . Acute on chronic respiratory failure ([place])   . Leucocytosis   . Hypoalbuminemia due to protein-calorie malnutrition ([place])   . Urinary frequency   . Debility 05/17/2018  . Acute and chronic respiratory failure with hypoxia ([place])   . Acute systolic heart failure ([place])   . Chronic atrial fibrillation   . Acute myocardial infarction, subendocardial infarction, subsequent episode of care ([place])   . Unspecified septicemia(038.9) ([place])   . Sepsis with acute organ dysfunction ([place])   . Acute and chronic respiratory failure with hypoxia [facility])     Expected Discharge Date: Expected Discharge Date: 06/06/18  Team Members Present: Physician leading conference: Dr. Delice Lesch Social Worker Present: Lennart Pall, LCSW Nurse Present: Blair Heys, RN PT Present: Burnard Bunting, PT OT Present: Mariane Masters, OT SLP Present: Weston Anna, SLP PPS Coordinator present : Ileana Ladd, PT     Current Status/Progress Goal Weekly Team Focus  Medical   Debility secondary to acute on chronic respiratory failure  Improve mobility, chronic pain, leukocytosis, urinary freq  See above    Bowel/Bladder   Pt is continent B/B. LBM 05/21/2018.  Maintain regular bowel pattern.  Assist with toileting needs PRN.   Swallow/Nutrition/ Hydration   Dys. 2 textures with thin liquids, Supervidion  Mod I  tolerance of thins and trials of advanced solids   ADL's   Mod/max A BADL tasks- limited by back pain and low endurance         Mobility   mod-max assist sit<>stand, min assist-CGA for transfers and gait w/ RW, L foot drop remains  supervision overall  LE strength, endurance, pain management, all functional mobility   Communication   Mod I  Mod I   Goals Met   Safety/Cognition/ Behavioral Observations  Supervision-Min A   Supervision-Mod I  semi-complex problem solving, recall with strategies, attention and awareness    Pain   No complaints of pain.  Remain pain free.  Assess pain Q shift and PRN.   Skin   Pt has ecchymosis to arms and abdomen bilat, cracking to heels of feet bilat, and MASD to scrotum.  Treat skin issues per orders.  Assess skin Q shift and PRN.    Rehab Goals Patient on target to meet rehab goals: Yes *See Care Plan and progress notes for long and short-term goals.     Barriers to Discharge  Current Status/Progress Possible Resolutions Date Resolved   Physician    Medical stability;Nutrition means;Other (comments)  Chronic pain  See above  Therapies, follow labs, PVRs- urinary retention meds, coping mechanisms for pain      Nursing  PT                    OT                  SLP                SW                Discharge Planning/Teaching Needs:  Plan to return home with sister and her family including a local neice who can provide 24/7 supervision.  Teaching needs to be determined closer to d/c.   Team Discussion:  Chronic pain;  Urinary freq - checking PVRs and adding meds.  Mod - max to stand but then can be CGA to amb very short distances.  Left foot drop and back pain are limiting.  Supervision to min assist goals.   Currently mod/ max for b/d.  Poor memory but overall cognition is good.  Revisions to Treatment Plan:  NA    Continued Need for Acute Rehabilitation Level of Care: The patient requires daily medical management by a physician with specialized training in physical medicine and rehabilitation for the following conditions: Daily direction of a multidisciplinary physical rehabilitation program to ensure safe treatment while eliciting the highest outcome that is of practical value to the patient.: Yes Daily medical management of patient stability for increased activity during participation in an intensive rehabilitation regime.: Yes Daily analysis of laboratory values and/or radiology reports with any subsequent need for medication adjustment of medical intervention for : Pulmonary problems;Other;Urological problems   I attest that I was present, lead the team conference, and concur with the assessment and plan of the team.   Lennart Pall 05/23/2018, 9:48 AM   Team conference was held via web/ teleconference due to [place] - 19

## 2018-05-23 NOTE — Progress Notes (Addendum)
Physical Therapy Session Note  Patient Details  Name: Richard Wang MRN: [id number] Date of Birth: 01-25-1946  Today's Date: 05/23/2018 PT Individual Time: 0915-0945 PT Individual Time Calculation (min): 30 min   Short Term Goals: Week 1:  PT Short Term Goal 1 (Week 1): Pt will perform sit<>stand transfer w/ mod assist  PT Short Term Goal 2 (Week 1): Pt will perform bed<>chair transfer w/ min assist PT Short Term Goal 3 (Week 1): Pt will ambulate 15' w/ LRAD w/ min assist PT Short Term Goal 4 (Week 1): Pt will participate in 30 min of OOB activity w/o increase in fatigue  Skilled Therapeutic Interventions/Progress Updates:     Patient in bed upon PT arrival. Patient alert and agreeable to PT session.  Therapeutic Activity: Bed Mobility: Patient performed supine to sit with supervision. Provided verbal cues for rolling versus sitting up in the bed to manage back pain. Transfers: Patient performed sit to/from stand x4 with a RW with min A-CGA for balance and safety, patient required less assist with each trial. Provided verbal cues for leaning forward before standing and pushing up from a stable surface. Patient was able to teach back scooting forward to the edge of the seat and bringing his feet underneith him before standing from previous sessions. PT demonstrated risk of RW tipping over if using B UE's to pull up to stand. Patient responded well to visual demonstration and pushed up with 1 UE for all trials with cueing x1.  Gait Training:  Patient ambulated 50 feet using ACE wrap for foot drop on L LE and RW with CGA for safety and balance. Ambulated with Decreased gait speed, decreased step length, increased hip and knee flexion, forward trunk lean, and downward head gaze. Provided verbal cues for erect posture, looking ahead, and increased step length.  Patient in recliner at end of session with breaks locked, seat belt alarm set, and all needs within reach. Therapist provided education  on diabetes management and performing daily foot checks due to neuropathy during session. Feet assessed by therapist during session, noted dry, calloses skin on B feet, RN aware.   Therapy Documentation Precautions:  Precautions Precautions: Fall Restrictions Weight Bearing Restrictions: No Pain: Patient reported 8/10 low back pain that was constant and achy. RN provided medication during session. Patient was repositioned, provided distraction, and provided increased activity/ambulation for pain from therapist.   Therapy/Group: Individual Therapy  Helayne [place], PT, DPT 05/23/2018, 12:25 PM

## 2018-05-23 NOTE — Progress Notes (Signed)
[facility] PHYSICAL MEDICINE & REHABILITATION PROGRESS NOTE   Subjective/Complaints: Patient seen laying in bed this morning working with SLP.  He states he slept better overnight.  He states that medications for his bladder are working.  He has questions about his Remicade for Crohn's.  ROS: + Nocturnal urinary frequency.  Denies dysuria, CP, SOB, N/V/D  Objective:   No results found. No results for input(s): WBC, HGB, HCT, PLT in the last 72 hours. No results for input(s): NA, K, CL, CO2, GLUCOSE, BUN, CREATININE, CALCIUM in the last 72 hours.  Intake/Output Summary (Last 24 hours) at 05/23/2018 1026 Last data filed at 05/23/2018 0752 Gross per 24 hour  Intake 500 ml  Output 2000 ml  Net -1500 ml     Physical Exam: Vital Signs Blood pressure 134/86, pulse 78, temperature (!) 97.4 F (36.3 C), resp. rate 20, height 5\' 10"  (1.778 m), weight 71.6 kg, SpO2 100 %. Constitutional: No distress . Vital signs reviewed. HENT: Normocephalic.  Atraumatic. Eyes: EOMI. No discharge. Cardiovascular: Irregularly irregular.  No JVD. Respiratory: CTA bilaterally.  Normal effort. GI: BS +. Non-distended. Musc: No edema or tenderness in extremities. Neurologic: Alert and oriented Motor: Grossly 4/5 throughout, except for left ankle dorsiflexion 1/5 Skin: Warm and dry.  Intact.  Assessment/Plan: 1. Functional deficits secondary to debility with bilalteral foot drop which require 3+ hours per day of interdisciplinary therapy in a comprehensive inpatient rehab setting.  Physiatrist is providing close team supervision and 24 hour management of active medical problems listed below.  Physiatrist and rehab team continue to assess barriers to discharge/monitor patient progress toward functional and medical goals  Care Tool:  Bathing    Body parts bathed by patient: Right arm, Left arm, Chest, Abdomen, Front perineal area, Right upper leg, Left upper leg, Face   Body parts bathed by helper:  Buttocks, Right lower leg, Left lower leg     Bathing assist Assist Level: Moderate Assistance - Patient 50 - 74%     Upper Body Dressing/Undressing Upper body dressing   What is the patient wearing?: Pull over shirt    Upper body assist Assist Level: Minimal Assistance - Patient > 75%    Lower Body Dressing/Undressing Lower body dressing      What is the patient wearing?: Pants     Lower body assist Assist for lower body dressing: Moderate Assistance - Patient 50 - 74%     Toileting Toileting    Toileting assist Assist for toileting: Minimal Assistance - Patient > 75%     Transfers Chair/bed transfer  Transfers assist     Chair/bed transfer assist level: Minimal Assistance - Patient > 75%     Locomotion Ambulation   Ambulation assist   Ambulation activity did not occur: Safety/medical concerns  Assist level: Contact Guard/Touching assist Assistive device: Walker-rolling Max distance: 50   Walk 10 feet activity   Assist  Walk 10 feet activity did not occur: Safety/medical concerns  Assist level: Moderate Assistance - Patient - 50 - 74% Assistive device: Walker-rolling   Walk 50 feet activity   Assist Walk 50 feet with 2 turns activity did not occur: Safety/medical concerns         Walk 150 feet activity   Assist Walk 150 feet activity did not occur: Safety/medical concerns         Walk 10 feet on uneven surface  activity   Assist Walk 10 feet on uneven surfaces activity did not occur: Safety/medical concerns  Wheelchair     Assist Will patient use wheelchair at discharge?: (TBD) Type of Wheelchair: Manual    Wheelchair assist level: Supervision/Verbal cueing Max wheelchair distance: 150'    Wheelchair 50 feet with 2 turns activity    Assist        Assist Level: Supervision/Verbal cueing   Wheelchair 150 feet activity     Assist     Assist Level: Supervision/Verbal cueing    Medical Problem  List and Plan: 1.Debilitysecondary to acute on chronic respiratory failure.  Continue CIR  PRAFO LLE, likely will need AFO 2. Antithrombotics: -DVT/anticoagulation:Subcutaneous heparin -antiplatelet therapy: Aspirin 81 mg daily DC'd on 3/30 due to patient stating he was told to avoid aspirin and other anticoagulants due to GI bleed.  Pharmacy contacted-no results after extensive review, please see pharmacy note. 3. Pain Management:Tylenol as needed  Chronic low back pain- tylenol not helping ,added  Methocarbamol with benefit.  K pad ordered 4. Mood:Provide emotional support -antipsychotic agents: N/A 5. Neuropsych: This patientiscapable of making decisions on hisown behalf. 6. Skin/Wound Care:routine skin checks 7. Fluids/Electrolytes/Nutrition:routineI's and O's   On D2 with honey liquids, advanced to D2 thins   Supplement with HS IVF 1/2ns 50cc/hr DC'd 8. Atrial fibrillation. Cardizem 30 mg twice a day, metoprolol 12.5 mg twice a day.   Confirmed with most recent ECG, reviewed 9. Hypertension. Hydralazine 20 mg every 8 hours, Norvasc 5 mg daily Vitals:   05/22/18 2043 05/23/18 0443  BP: (!) 140/91 134/86  Pulse: (!) 109 78  Resp:  20  Temp: 98.3 F (36.8 C) (!) 97.4 F (36.3 C)  SpO2: 99% 100%   Relatively controlled on 4/2 10.  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  CBG (last 3)  Recent Labs    05/22/18 1648 05/22/18 2109 05/23/18 0633  GLUCAP 206* 156* 81   CBGs labile on 4/2  Will order hemoglobin A1c with next set of labs  Monitor with steroid taper 11. Hyperlipidemia. Lipitor 12. Acute on chronic respiratory failure: Slow taper, decreased to 30 on 4/1 13.  Anemia normocytic  Hemoglobin 8.8 on 3/30  Continue to monitor 14.  Hypoalbuminemia  Supplement initiated on 3/30 15.  Leukocytosis-likely steroid-induced  WBCs 11.6 on 3/30  Afebrile 16.   Urinary frequency, predominantly at night  Continue Flomax, increased on 4/1  PVRs suggesting retention  Bethanechol 10 nightly started on 3/31, increased to 3 times daily on 4/1  Improving  LOS: 6 days A FACE TO FACE EVALUATION WAS PERFORMED   Karis Juba 05/23/2018, 10:26 AM

## 2018-05-23 NOTE — Progress Notes (Signed)
Speech Language Pathology Daily Session Note  Patient Details  Name: Richard Wang MRN: [id number] Date of Birth: 1945-05-11  Today's Date: 05/23/2018 SLP Individual Time: 0710-0805 SLP Individual Time Calculation (min): 55 min  Short Term Goals: Week 1: SLP Short Term Goal 1 (Week 1): Pt will consume trials of upgraded diet textures with minimal overt s/s of dysphagia or aspiration for 3 sessions prior to upgrade.  SLP Short Term Goal 1 - Progress (Week 1): Updated due to goal met SLP Short Term Goal 2 (Week 1): Pt will utilize external memory aids and compensatory strategies to recall new, daily information with Mod I.  SLP Short Term Goal 2 - Progress (Week 1): Updated due to goal met SLP Short Term Goal 3 (Week 1): Pt will complete semi-complex money and medication management tasks with Mod I.  SLP Short Term Goal 3 - Progress (Week 1): Updated due to goal met SLP Short Term Goal 4 (Week 1): Pt will demonstrate anticipatory awareness by listing hypothetical safe and unsafe activities within home environment with Mod I.  SLP Short Term Goal 4 - Progress (Week 1): Updated due to goal met SLP Short Term Goal 5 (Week 1): Pt will consume current diet without overt s/s aspiration with Mod I use of compensatory swallow strategies (i.e., sitting upright out of bed or edge of bed).  SLP Short Term Goal 5 - Progress (Week 1): Updated due to goal met SLP Short Term Goal 6 (Week 1): Pt will utilizie an increased vocal intensity in simple conversation to achieve 95% intelligiblity in moderately nosiy environment with supervison A verbal cues. SLP Short Term Goal 6 - Progress (Week 1): Met  Skilled Therapeutic Interventions:  Skilled treatment session focused on dysphagia and cognitive goals. SLP facilitated session by providing trials of Dys. 3 textures. Patient demonstrated efficient mastication with complete oral clearance without overt s/s of aspiration. Therefore, recommend trial tray prior to  upgrade. Patient appeared more alert this session and recalled daily information in regards to events from previous therapy sessions with supervision verbal cues. However, Min A verbal cues needed to recall information that he wanted to ask physician when in the room in regards to medications. Patient was 100% intelligible with Mod I and patient's vocal intensity appeared [facility]. Patient left upright in bed with alarm on and all needs within reach. Continue with current plan of care.      Pain Pain Assessment Pain Scale: 0-10 Pain Score: 0-No pain Faces Pain Scale: No hurt  Therapy/Group: Individual Therapy  Nayelie Gionfriddo 05/23/2018, 10:40 AM

## 2018-05-23 NOTE — Progress Notes (Signed)
Physical Therapy Session Note  Patient Details  Name: Richard Wang MRN: [id number] Date of Birth: November 09, 1945  Today's Date: 05/23/2018 PT Individual Time: 0915-0945 PT Individual Time Calculation (min): 30 min   Short Term Goals: Week 1:  PT Short Term Goal 1 (Week 1): Pt will perform sit<>stand transfer w/ mod assist  PT Short Term Goal 2 (Week 1): Pt will perform bed<>chair transfer w/ min assist PT Short Term Goal 3 (Week 1): Pt will ambulate 15' w/ LRAD w/ min assist PT Short Term Goal 4 (Week 1): Pt will participate in 30 min of OOB activity w/o increase in fatigue  Skilled Therapeutic Interventions/Progress Updates:   Pt in w/c and agreeable to therapy, states his back feels ok today, no specific c/o pain. Pt self-propelled w/c to/from therapy gym w/ supervision using BUEs for UE strengthening. Worked on functional mobility and strengthening this session. Ambulated 25' x2 w/ RW, CGA w/ L DF ace wrap for L foot clearance. Discussed asking niece to bring pt shoes to test use of AFO for gait. Needed mod-max assist sit<>stand from w/c this session, verbal and tactile cues to use UEs to control descent into chair. Back pain 4/10 w/ gait. LE strengthening exercises as listed below w/ frequent seated rest breaks 2/2 fatigue and occasional increase in low back pain w/ all exercise. Kinetron in seated @ 30 cm/sec, 1 min on and 1 min off x3. Returned to room and ended session in recliner, all needs in reach.  BLE strengthening: -partial knee bends 3x10 -LAQs w/ 3# ankle weight 3x5 -heel slides w/ beige theraband 2x10 -seated knee marches 1x10, w/ 3# ankle weights 2x5 -adduction squeeze 1x10 -resisted abduction w/ beige theraband 3x10  Therapy Documentation Precautions:  Precautions Precautions: Fall Restrictions Weight Bearing Restrictions: No Pain:    Therapy/Group: Individual Therapy  Tristina Sahagian Melton Krebs 05/23/2018, 12:37 PM

## 2018-05-24 ENCOUNTER — Inpatient Hospital Stay (HOSPITAL_COMMUNITY): Payer: Medicare Other | Admitting: Physical Therapy

## 2018-05-24 ENCOUNTER — Inpatient Hospital Stay (HOSPITAL_COMMUNITY): Payer: Medicare Other | Admitting: Speech Pathology

## 2018-05-24 ENCOUNTER — Inpatient Hospital Stay (HOSPITAL_COMMUNITY): Payer: Medicare Other | Admitting: Occupational Therapy

## 2018-05-24 LAB — GLUCOSE, CAPILLARY
Glucose-Capillary: 80 mg/dL (ref 70–99)
Glucose-Capillary: 105 mg/dL — ABNORMAL HIGH (ref 70–99)
Glucose-Capillary: 236 mg/dL — ABNORMAL HIGH (ref 70–99)

## 2018-05-24 MED ORDER — AQUAPHOR EX OINT
TOPICAL_OINTMENT | Freq: Every day | CUTANEOUS | Status: DC | PRN
  Administered 2018-05-25: 20:00:00 via TOPICAL
  Administered 2018-05-27: 1 via TOPICAL
  Filled 2018-05-24: qty 50

## 2018-05-24 NOTE — Progress Notes (Signed)
Occupational Therapy Weekly Progress Note  Patient Details  Name: Richard Wang MRN: [id number] Date of Birth: 02-28-1945  Beginning of progress report period: May 18, 2018 End of progress report period: May 24, 2018  Today's Date: 05/24/2018 OT Individual Time: 1400-1510 OT Individual Time Calculation (min): 70 min    Patient has met 2 of 3 short term goals.  Pt is making steady progress towards OT goals at this time. Pt continues to need min/mod A for LB ADLs, pending fatigue level. Pt continues to fatigue quickly, but is working hard in therapies. Sit<>stadns have improved to min/mod A as well with RW. Continue current POC.  Patient continues to demonstrate the following deficits: muscle weakness, decreased oxygen support, decreased awareness, decreased problem solving, decreased safety awareness and decreased memory and decreased standing balance, decreased postural control and decreased balance strategies and therefore will continue to benefit from skilled OT intervention to enhance overall performance with BADL.  Patient progressing toward long term goals..  Continue plan of care.  OT Short Term Goals Week 1:  OT Short Term Goal 1 (Week 1): Pt will perform toileting with max A overall to decrease A with self care. OT Short Term Goal 1 - Progress (Week 1): Met OT Short Term Goal 2 (Week 1): Pt will peform LB dressing with min A for standing balance during clothing management.  OT Short Term Goal 2 - Progress (Week 1): Met OT Short Term Goal 3 (Week 1): Pt will tolerate standing for 5 minutes to complete self care tasks. OT Short Term Goal 3 - Progress (Week 1): Progressing toward goal Week 2:  OT Short Term Goal 1 (Week 2): Pt will tolerate standing for 5 mins in preparation for BADL tasks OT Short Term Goal 2 (Week 2): Pt will complete shower transfer with min A OT Short Term Goal 3 (Week 2): Pt will complete 2/3 toileting steps   Skilled Therapeutic Interventions/Progress  Updates:    Pt greeted semi-reclined in bed and agreeable to OT treatment session focused on bathing/dressing, activity tolerance, and UB strengthening. Pt came to sitting EOB with supervision. He then completed sit<>stand w/ RW and min A. Pt ambulated to the bathroom with CGA, progressing to min A with LE fatigue. Pt needed extended rest break after ambulating prior to doffing clothing. Bathing completed with assistance to wash LEs. Leaning method to wash buttocks in sitting. Pt needed Max A to then stand from tub bench 2/2 fatigue. Min A to then pivot out of shower. Pt able to thread LEs into pants, Mod A sit<>stand and min A for balance when pulling up pants 2/2 posterior LOB.  BUE strength/coordination w/ wc propulsion to dayroom. Pt needed extended rest break, then completed 10 mins on Sci Fit arm bike level 2 with no rest breaks. Pt propelled wc back to room with 2 rest break and was left seated in wc with alarm belt on and needs met.  Therapy Documentation Precautions:  Precautions Precautions: Fall Restrictions Weight Bearing Restrictions: No Pain: Pain Assessment Pain Scale: 0-10 Pain Score: 5  Pain Type: Chronic pain Pain Location: Back Pain Orientation: Lower Pain Descriptors / Indicators: Aching Pain Onset: On-going Pain Intervention(s): Repositioned   Therapy/Group: Individual Therapy  Valma Cava 05/24/2018, 3:25 PM

## 2018-05-24 NOTE — Progress Notes (Signed)
Physical Therapy Weekly Progress Note  Patient Details  Name: Richard Wang MRN: [id number] Date of Birth: 1945-07-15  Beginning of progress report period: May 18, 2018 End of progress report period: May 24, 2018  Today's Date: 05/24/2018 PT Individual Time: 0930-1025 PT Individual Time Calculation (min): 55 min   Patient has met 4 of 4 short term goals. Pt is progressing well w/ therapies. He is performing most OOB mobility w/ min-mod assist depending on level of fatigue. He consistently requires mod assist sit>stand 2/2 LE weakness and is limited by chronic low back pain in his upright/standing tolerance. L foot drop remains and etiology remains unclear, pt states this was happening prior to initial hospital admission and suspect has been exacerbated by prolonged immobility. Pt would likely benefit from [facility], however family has not yet been able to bring in shoes to trial.   Patient continues to demonstrate the following deficits muscle weakness and muscle joint tightness, decreased cardiorespiratoy endurance, L foot drop and decreased standing balance, decreased postural control and decreased balance strategies and therefore will continue to benefit from skilled PT intervention to increase functional independence with mobility.  Patient progressing toward long term goals..  Continue plan of care.  PT Short Term Goals Week 1:  PT Short Term Goal 1 (Week 1): Pt will perform sit<>stand transfer w/ mod assist  PT Short Term Goal 1 - Progress (Week 1): Met PT Short Term Goal 2 (Week 1): Pt will perform bed<>chair transfer w/ min assist PT Short Term Goal 2 - Progress (Week 1): Met PT Short Term Goal 3 (Week 1): Pt will ambulate 15' w/ LRAD w/ min assist PT Short Term Goal 3 - Progress (Week 1): Met PT Short Term Goal 4 (Week 1): Pt will participate in 30 min of OOB activity w/o increase in fatigue PT Short Term Goal 4 - Progress (Week 1): Met Week 2:  PT Short Term Goal 1 (Week 2): Pt will  perform sit<>stands w/ min assist  PT Short Term Goal 2 (Week 2): Pt will initiate stair training for funcitonal strengthening PT Short Term Goal 3 (Week 2): Pt will perform bed<>chair transfers w/ min assist 100% of the time PT Short Term Goal 4 (Week 2): Pt will maintain dynamic standing balance w/ min assist  Skilled Therapeutic Interventions/Progress Updates:   Pt in supine and agreeable to therapy, set-up assist to put lotion on feet and don socks w/ increased time. Min assist squat pivot to w/c. Pt self-propelled w/c to therapy gym for endurance training. Worked on sit<>stands from slightly elevated mat surface w/o RW in front to stabilize in stance. Min assist 3x3 reps, manual and tactile cues for anterior weight shifting and upright posture. Pt reporting needing to toilet. Worked on gait while heading back towards room. Ambulated 50' x3 w/ CGA using RW. Moderate increase in work of breathing w/ each gait bout that resolves w/ brief rest. Ambulated into bathroom of room using RW, CGA, and performed toilet transfer w/ min assist overall for LE garment management and pericare. Ambulated back to recliner. Reviewed exercises he can perform outside of therapy while sitting up in recliner, including LAQs, knee marches, and heel raises. Pt demonstrated correctly and w/ minimal increase in back pain. Demonstrated use of gait belt to stretch B gastroc in sitting, pt demo-ed correctly and w/o increase in pain. Pain 5/10 in back throughout session, declined intervention but applied kpad at end of session. Ended session in recliner, all needs in reach.  Therapy  Documentation Precautions:  Precautions Precautions: Fall Restrictions Weight Bearing Restrictions: No Vital Signs: Therapy Vitals Pulse Rate: 88 BP: (!) 142/86  Therapy/Group: Individual Therapy  Teejay Meader K Loray Akard 05/24/2018, 10:26 AM

## 2018-05-24 NOTE — Progress Notes (Addendum)
PHYSICAL MEDICINE & REHABILITATION PROGRESS NOTE   Subjective/Complaints: Patient seen laying in bed this morning.  He states he slept better overnight.  He believes his bladder medications are working and would like to keep the same dose for today.  He called me back into the room to ask me questions about lotion for his feet.  ROS: Denies dysuria, CP, SOB, N/V/D  Objective:   No results found. No results for input(s): WBC, HGB, HCT, PLT in the last 72 hours. No results for input(s): NA, K, CL, CO2, GLUCOSE, BUN, CREATININE, CALCIUM in the last 72 hours.  Intake/Output Summary (Last 24 hours) at 05/24/2018 0843 Last data filed at 05/24/2018 0700 Gross per 24 hour  Intake 720 ml  Output 1850 ml  Net -1130 ml     Physical Exam: Vital Signs Blood pressure (!) 143/84, pulse 87, temperature 97.7 F (36.5 C), temperature source Oral, resp. rate 17, height 5\' 10"  (1.778 m), weight 71.6 kg, SpO2 100 %. Constitutional: No distress . Vital signs reviewed. HENT: Normocephalic.  Atraumatic. Eyes: EOMI. No discharge. Cardiovascular: Irregularly irregular.  No JVD. Respiratory: CTA bilaterally.  Normal effort. GI: BS +. Non-distended. Musc: No edema or tenderness in extremities. Neurologic: Alert and oriented Motor: Grossly 4/5 throughout, except for left ankle dorsiflexion 1/5, unchanged Skin: Warm and dry.  Intact.  Assessment/Plan: 1. Functional deficits secondary to debility with bilalteral foot drop which require 3+ hours per day of interdisciplinary therapy in a comprehensive inpatient rehab setting.  Physiatrist is providing close team supervision and 24 hour management of active medical problems listed below.  Physiatrist and rehab team continue to assess barriers to discharge/monitor patient progress toward functional and medical goals  Care Tool:  Bathing    Body parts bathed by patient: Right arm, Left arm, Chest, Abdomen, Front perineal area, Right upper leg,  Left upper leg, Face   Body parts bathed by helper: Buttocks, Right lower leg, Left lower leg     Bathing assist Assist Level: Moderate Assistance - Patient 50 - 74%     Upper Body Dressing/Undressing Upper body dressing   What is the patient wearing?: Pull over shirt    Upper body assist Assist Level: Minimal Assistance - Patient > 75%    Lower Body Dressing/Undressing Lower body dressing      What is the patient wearing?: Pants     Lower body assist Assist for lower body dressing: Moderate Assistance - Patient 50 - 74%     Toileting Toileting    Toileting assist Assist for toileting: Minimal Assistance - Patient > 75%     Transfers Chair/bed transfer  Transfers assist     Chair/bed transfer assist level: Minimal Assistance - Patient > 75%     Locomotion Ambulation   Ambulation assist   Ambulation activity did not occur: Safety/medical concerns  Assist level: Contact Guard/Touching assist Assistive device: Walker-rolling Max distance: 50'   Walk 10 feet activity   Assist  Walk 10 feet activity did not occur: Safety/medical concerns  Assist level: Contact Guard/Touching assist Assistive device: Walker-rolling   Walk 50 feet activity   Assist Walk 50 feet with 2 turns activity did not occur: Safety/medical concerns  Assist level: Contact Guard/Touching assist Assistive device: Walker-rolling    Walk 150 feet activity   Assist Walk 150 feet activity did not occur: Safety/medical concerns         Walk 10 feet on uneven surface  activity   Assist Walk 10 feet on uneven  surfaces activity did not occur: Safety/medical concerns         Wheelchair     Assist Will patient use wheelchair at discharge?: (TBD) Type of Wheelchair: Manual    Wheelchair assist level: Supervision/Verbal cueing Max wheelchair distance: 150'    Wheelchair 50 feet with 2 turns activity    Assist        Assist Level: Supervision/Verbal cueing    Wheelchair 150 feet activity     Assist     Assist Level: Supervision/Verbal cueing    Medical Problem List and Plan: 1.Debilitysecondary to acute on chronic respiratory failure.  Continue CIR  PRAFO LLE, likely will need AFO 2. Antithrombotics: -DVT/anticoagulation:Subcutaneous heparin -antiplatelet therapy: Aspirin 81 mg daily DC'd on 3/30 due to patient stating he was told to avoid aspirin and other anticoagulants due to GI bleed.  Pharmacy contacted-no results after extensive review, please see pharmacy note. 3. Pain Management:Tylenol as needed  Chronic low back pain- tylenol not helping ,added  Methocarbamol with benefit.  K pad ordered 4. Mood:Provide emotional support -antipsychotic agents: N/A 5. Neuropsych: This patientiscapable of making decisions on hisown behalf. 6. Skin/Wound Care:routine skin checks 7. Fluids/Electrolytes/Nutrition:routineI's and O's   On D2 with honey liquids, advanced to D2 thins, advanced to D3 thins  Supplement with HS IVF 1/2ns 50cc/hr DC'd 8. Atrial fibrillation. Cardizem 30 mg twice a day, metoprolol 12.5 mg twice a day.   Confirmed with most recent ECG, reviewed 9. Hypertension. Hydralazine 20 mg every 8 hours, Norvasc 5 mg daily Vitals:   05/23/18 2029 05/24/18 0500  BP: 121/79 (!) 143/84  Pulse: 90 87  Resp: 17 17  Temp: 98.3 F (36.8 C) 97.7 F (36.5 C)  SpO2: 98% 100%   Labile on 4/3 10.  Steroid-induced hyperglycemia with L foot drop  Lantus insulin 7 units daily.  CBG (last 3)  Recent Labs    05/23/18 1656 05/23/18 2114 05/24/18 0618  GLUCAP 187* 171* 80   CBGs labile on 4/3  Hemoglobin A1c 5.0 on 3/8  Monitor with steroid taper 11. Hyperlipidemia. Lipitor 12. Acute on chronic respiratory failure: Slow taper, decreased to 30 on 4/1 13.  Anemia normocytic  Hemoglobin 8.8 on 3/30  Labs ordered for Monday  Continue to monitor 14.  Hypoalbuminemia  Supplement  initiated on 3/30 15.  Leukocytosis-likely steroid-induced  WBCs 11.6 on 3/30  Labs ordered for Monday  Afebrile 16.  Urinary frequency, predominantly at night  Continue Flomax, increased on 4/1  PVRs suggesting retention  Bethanechol 10 nightly started on 3/31, increased to 3 times daily on 4/1  Improving 17.  Dry feet  Lotion ordered  LOS: 7 days A FACE TO FACE EVALUATION WAS PERFORMED   Karis Juba 05/24/2018, 8:43 AM

## 2018-05-24 NOTE — Progress Notes (Signed)
Speech Language Pathology Weekly Progress and Session Note  Patient Details  Name: Richard Wang MRN: [id number] Date of Birth: 03-18-1945  Beginning of progress report period: May 17, 2018 End of progress report period: May 24, 2018  Today's Date: 05/24/2018  Session 1: SLP Individual Time: 9449-6759 SLP Individual Time Calculation (min): 55 min  Session 2: SLP Individual Time: 1255-1320 SLP Individual Time Calculation (min): 25 min   Short Term Goals: Week 1: SLP Short Term Goal 1 (Week 1): Pt will consume trials of upgraded diet textures with minimal overt s/s of dysphagia or aspiration for 3 sessions prior to upgrade.  SLP Short Term Goal 1 - Progress (Week 1): Met SLP Short Term Goal 2 (Week 1): Pt will utilize external memory aids and compensatory strategies to recall new, daily information with Mod I.  SLP Short Term Goal 2 - Progress (Week 1): Not met SLP Short Term Goal 3 (Week 1): Pt will complete semi-complex money and medication management tasks with Mod I.  SLP Short Term Goal 3 - Progress (Week 1): Met SLP Short Term Goal 4 (Week 1): Pt will demonstrate anticipatory awareness by listing hypothetical safe and unsafe activities within home environment with Mod I.  SLP Short Term Goal 4 - Progress (Week 1): Met SLP Short Term Goal 5 (Week 1): Pt will consume current diet without overt s/s aspiration with Mod I use of compensatory swallow strategies (i.e., sitting upright out of bed or edge of bed).  SLP Short Term Goal 5 - Progress (Week 1): Met SLP Short Term Goal 6 (Week 1): Pt will utilizie an increased vocal intensity in simple conversation to achieve 95% intelligiblity in moderately nosiy environment with supervison A verbal cues. SLP Short Term Goal 6 - Progress (Week 1): Met    New Short Term Goals: Week 2: SLP Short Term Goal 1 (Week 2): Pt will utilize external memory aids and compensatory strategies to recall new, daily information with Mod I.  SLP Short Term  Goal 2 (Week 2): Patient will consume trials of regular textures with efficient mastication and without overt s/s of aspiration over 2 sessions prior to upgrade.   Weekly Progress Updates: Patient has made functional gains and has met 5 of 6 STGs this reporting period. Currently, patient is consuming Dys 3 textures with thin liquids without overt s/s of aspiration and overall Mod I for use of swallowing compensatory strategies. Patient demonstrates improved cognitive functioning in regards to problem solving and awareness but continues to require overall Min multimodal cues for recall of daily information with use of strategies. Patient is also 100% intelligible at the conversation level with Mod I. Patient education ongoing and patient would benefit from continued skilled SLP intervention to maximize his cognitive and swallowing function prior to discharge.      Intensity: Minumum of 1-2 x/day, 30 to 90 minutes Frequency: 3 to 5 out of 7 days Duration/Length of Stay: 06/06/18 Treatment/Interventions: Cognitive remediation/compensation;Cueing hierarchy;Dysphagia/aspiration precaution training;Functional tasks   Daily Session  Skilled Therapeutic Interventions:   Session 1: Skilled treatment session focused on cognitive goals. SLP facilitated session by providing Min A verbal cues for problem solving while attempting to choose lunch and dinner items from the menu. SLP also facilitated session by providing education and Min A verbal cues to generate ways to incorporate memory compensatory strategies to maximize recall and carryover of daily information at home. However, patient appeared to recall more functional information today like change in diet and conversation with physician. Patient  left upright in bed with alarm on and all needs within reach. Continue with current plan of care.   Session 2: Skilled treatment session focused on dysphagia goals. SLP facilitated session by providing skilled  observation with trial lunch tray of Dys. 3 textures with thin liquids. Patient consumed meal with efficient mastication and complete oral clearance without overt s/s of aspiration, therefore, recommend patient upgrade to Dys. 3 textures. Patient also consumed multiple medications whole with thin liquids without overt s/s of aspiration, therefore, recommend patient consume medications whole with liquids. Patient left upright in bed with all needs within reach and alarm on. Continue with current plan of care.       Pain No/Denies Pain   Therapy/Group: Individual Therapy  Audreana Hancox 05/24/2018, 6:55 AM

## 2018-05-25 ENCOUNTER — Inpatient Hospital Stay (HOSPITAL_COMMUNITY): Payer: Medicare Other | Admitting: Physical Therapy

## 2018-05-25 ENCOUNTER — Inpatient Hospital Stay (HOSPITAL_COMMUNITY): Payer: Medicare Other | Admitting: Occupational Therapy

## 2018-05-25 LAB — GLUCOSE, CAPILLARY
Glucose-Capillary: 84 mg/dL (ref 70–99)
Glucose-Capillary: 145 mg/dL — ABNORMAL HIGH (ref 70–99)
Glucose-Capillary: 219 mg/dL — ABNORMAL HIGH (ref 70–99)
Glucose-Capillary: 139 mg/dL — ABNORMAL HIGH (ref 70–99)

## 2018-05-25 MED ORDER — PREDNISONE 20 MG PO TABS
20.0000 mg | ORAL_TABLET | Freq: Every day | ORAL | Status: DC
  Administered 2018-05-26 – 2018-05-29 (×4): 20 mg via ORAL
  Filled 2018-05-25 (×4): qty 1

## 2018-05-25 NOTE — Progress Notes (Signed)
Physical Therapy Session Note  Patient Details  Name: Richard Wang MRN: [id number] Date of Birth: 08/31/1945  Today's Date: 05/25/2018 PT Individual Time: 0800-0900 PT Individual Time Calculation (min): 60 min   Short Term Goals: Week 2:  PT Short Term Goal 1 (Week 2): Pt will perform sit<>stands w/ min assist  PT Short Term Goal 2 (Week 2): Pt will initiate stair training for funcitonal strengthening PT Short Term Goal 3 (Week 2): Pt will perform bed<>chair transfers w/ min assist 100% of the time PT Short Term Goal 4 (Week 2): Pt will maintain dynamic standing balance w/ min assist  Skilled Therapeutic Interventions/Progress Updates:   Pt in supine and agreeable to therapy, pain as detailed below. Supervision bed mobility and donned pants/socks w/ supervision at EOB, min assist to pull pants up in standing w/ RW support. Stand pivot transfer w/ min assist to w/c using RW. Pt self-propelled w/c to/from therapy gym w/ UEs to work on endurance and UE strengthening. Sit<>stands in parallel bars and performed step up to/from 4" step to work on functional LE strengthening. Performed L step up 2x4, R step up 2x4 w/ CGA for safety. Verbal reminders to utilize LEs > UEs. NuStep 7 min @ level 4 for LE strengthening and endurance training. Returned to room and ambulated to toilet in bathroom, agreeable to call for RN when ready to stand, verbalizes understanding that he needs assistance from staff to stand. Made NT aware.   Therapy Documentation Precautions:  Precautions Precautions: Fall Restrictions Weight Bearing Restrictions: No Vital Signs: Therapy Vitals Pulse Rate: 70 BP: 123/69 Oxygen Therapy SpO2: 100 % Pain: Pain Assessment Pain Scale: 0-10 Pain Score: 5  Pain Intervention(s): Repositioned  Therapy/Group: Individual Therapy  Ashleen Demma K Sheilia Reznick 05/25/2018, 9:07 AM

## 2018-05-25 NOTE — Progress Notes (Signed)
Occupational Therapy Session Note  Patient Details  Name: Richard Wang MRN: [id number] Date of Birth: 01/03/1946  Today's Date: 05/25/2018 OT Individual Time: 1115-1155 OT Individual Time Calculation (min): 40 min    Short Term Goals: Week 2:  OT Short Term Goal 1 (Week 2): Pt will tolerate standing for 5 mins in preparation for BADL tasks OT Short Term Goal 2 (Week 2): Pt will complete shower transfer with min A OT Short Term Goal 3 (Week 2): Pt will complete 2/3 toileting steps   Skilled Therapeutic Interventions/Progress Updates:    Pt greeted seated in recliner and agreeable to OT treatment session. Stand-pivots throughout session with min A. B UE strengthening/coordinatino w/ wc propulsion to dayroom- no rest breaks needed. Worked on standing endurance and balance with wii bowling activity. Pt tolerated 2 mins standing at longest bout and needed extended rest breaks in between stands to recover. Pt with 1 posterior LOB requiring mod A to safely sit back onto wc. Pt propelled wc back to room and pivoted back to recliner in similar fashion. Pt left with chair alarm on and needs met.   Therapy Documentation Precautions:  Precautions Precautions: Fall Restrictions Weight Bearing Restrictions: No Pain: Pain Assessment Pain Scale: 0-10 Pain Score: 5  Pain Type: Chronic pain Pain Location: Back Pain Orientation: Lower Pain Descriptors / Indicators: Aching Pain Onset: On-going Pain Intervention(s): Repositioned   Therapy/Group: Individual Therapy  Valma Cava 05/25/2018, 12:30 PM

## 2018-05-25 NOTE — Progress Notes (Signed)
[facility] PHYSICAL MEDICINE & REHABILITATION PROGRESS NOTE   Subjective/Complaints: Patient seen laying in bed this morning.  He states he slept better overnight.  He would like to keep his medications the same.  He is appreciative of the foot lotion.  ROS: Denies dysuria, CP, SOB, N/V/D  Objective:   No results found. No results for input(s): WBC, HGB, HCT, PLT in the last 72 hours. No results for input(s): NA, K, CL, CO2, GLUCOSE, BUN, CREATININE, CALCIUM in the last 72 hours.  Intake/Output Summary (Last 24 hours) at 05/25/2018 1255 Last data filed at 05/25/2018 0930 Gross per 24 hour  Intake 1320 ml  Output 950 ml  Net 370 ml     Physical Exam: Vital Signs Blood pressure 123/69, pulse 70, temperature 97.7 F (36.5 C), temperature source Oral, resp. rate 20, height 5\' 10"  (1.778 m), weight 71.6 kg, SpO2 100 %. Constitutional: No distress . Vital signs reviewed. HENT: Normocephalic.  Atraumatic. Eyes: EOMI. No discharge. Cardiovascular: Irregularly irregular.  No JVD. Respiratory: CTA bilaterally.  Normal effort. GI: BS +. Non-distended. Musc: No edema or tenderness in extremities. Neurologic: Alert and oriented Motor: Grossly 4/5 throughout, except for left ankle dorsiflexion 1/5, stable Skin: Warm and dry.  Intact.  Assessment/Plan: 1. Functional deficits secondary to debility with bilalteral foot drop which require 3+ hours per day of interdisciplinary therapy in a comprehensive inpatient rehab setting.  Physiatrist is providing close team supervision and 24 hour management of active medical problems listed below.  Physiatrist and rehab team continue to assess barriers to discharge/monitor patient progress toward functional and medical goals  Care Tool:  Bathing    Body parts bathed by patient: Right arm, Left arm, Chest, Abdomen, Front perineal area, Right upper leg, Left upper leg, Face   Body parts bathed by helper: Buttocks, Right lower leg, Left lower leg      Bathing assist Assist Level: Moderate Assistance - Patient 50 - 74%     Upper Body Dressing/Undressing Upper body dressing   What is the patient wearing?: Pull over shirt    Upper body assist Assist Level: Minimal Assistance - Patient > 75%    Lower Body Dressing/Undressing Lower body dressing      What is the patient wearing?: Pants     Lower body assist Assist for lower body dressing: Moderate Assistance - Patient 50 - 74%     Toileting Toileting    Toileting assist Assist for toileting: Minimal Assistance - Patient > 75%     Transfers Chair/bed transfer  Transfers assist     Chair/bed transfer assist level: Minimal Assistance - Patient > 75%     Locomotion Ambulation   Ambulation assist   Ambulation activity did not occur: Safety/medical concerns  Assist level: Contact Guard/Touching assist Assistive device: Walker-rolling Max distance: 50'   Walk 10 feet activity   Assist  Walk 10 feet activity did not occur: Safety/medical concerns  Assist level: Contact Guard/Touching assist Assistive device: Walker-rolling   Walk 50 feet activity   Assist Walk 50 feet with 2 turns activity did not occur: Safety/medical concerns  Assist level: Contact Guard/Touching assist Assistive device: Walker-rolling    Walk 150 feet activity   Assist Walk 150 feet activity did not occur: Safety/medical concerns         Walk 10 feet on uneven surface  activity   Assist Walk 10 feet on uneven surfaces activity did not occur: Safety/medical concerns         Wheelchair  Assist Will patient use wheelchair at discharge?: (TBD) Type of Wheelchair: Manual    Wheelchair assist level: Supervision/Verbal cueing Max wheelchair distance: 150'    Wheelchair 50 feet with 2 turns activity    Assist        Assist Level: Supervision/Verbal cueing   Wheelchair 150 feet activity     Assist     Assist Level: Supervision/Verbal cueing     Medical Problem List and Plan: 1.Debilitysecondary to acute on chronic respiratory failure.  Continue CIR  PRAFO LLE, likely will need AFO 2. Antithrombotics: -DVT/anticoagulation:Subcutaneous heparin -antiplatelet therapy: Aspirin 81 mg daily DC'd on 3/30 due to patient stating he was told to avoid aspirin and other anticoagulants due to GI bleed.  Pharmacy contacted-no results after extensive review, please see pharmacy note. 3. Pain Management:Tylenol as needed  Chronic low back pain- tylenol not helping ,added  Methocarbamol with benefit.  K pad ordered 4. Mood:Provide emotional support -antipsychotic agents: N/A 5. Neuropsych: This patientiscapable of making decisions on hisown behalf. 6. Skin/Wound Care:routine skin checks 7. Fluids/Electrolytes/Nutrition:routineI's and O's   On D2 with honey liquids, advanced to D2 thins, advanced to D3 thins  Supplement with HS IVF 1/2ns 50cc/hr DC'd 8. Atrial fibrillation. Cardizem 30 mg twice a day, metoprolol 12.5 mg twice a day.   Confirmed with most recent ECG, reviewed 9. Hypertension. Hydralazine 20 mg every 8 hours, Norvasc 5 mg daily Vitals:   05/25/18 0440 05/25/18 0748  BP: (!) 153/100 123/69  Pulse: 81 70  Resp: 20   Temp: 97.7 F (36.5 C)   SpO2: 100% 100%   Labile on 4/4 10.  Steroid-induced hyperglycemia with   Lantus insulin 7 units daily.  CBG (last 3)  Recent Labs    05/24/18 2113 05/25/18 0643 05/25/18 1220  GLUCAP 236* 84 145*   CBGs labile on 4/4  Hemoglobin A1c 5.0 on 3/8  Monitor with steroid taper 11. Hyperlipidemia. Lipitor 12. Acute on chronic respiratory failure: Slow taper, decreased to 30 on 4/1, decreased again on 4/5 13.  Anemia normocytic  Hemoglobin 8.8 on 3/30  Labs ordered for Monday  Continue to monitor 14.  Hypoalbuminemia  Supplement initiated on 3/30 15.  Leukocytosis-likely steroid-induced  WBCs 11.6 on 3/30  Labs ordered for  Monday  Afebrile 16.  Urinary frequency, predominantly at night  Continue Flomax, increased on 4/1  PVRs suggesting retention  Bethanechol 10 nightly started on 3/31, increased to 3 times daily on 4/1  Improving overall 17.  Dry feet  Lotion ordered- improving  LOS: 8 days A FACE TO FACE EVALUATION WAS PERFORMED  Richard Wang Karis Juba 05/25/2018, 12:55 PM

## 2018-05-26 ENCOUNTER — Inpatient Hospital Stay (HOSPITAL_COMMUNITY): Payer: Medicare Other | Admitting: Physical Therapy

## 2018-05-26 DIAGNOSIS — D62 Acute posthemorrhagic anemia: Secondary | ICD-10-CM

## 2018-05-26 LAB — GLUCOSE, CAPILLARY
Glucose-Capillary: 80 mg/dL (ref 70–99)
Glucose-Capillary: 99 mg/dL (ref 70–99)
Glucose-Capillary: 191 mg/dL — ABNORMAL HIGH (ref 70–99)
Glucose-Capillary: 207 mg/dL — ABNORMAL HIGH (ref 70–99)

## 2018-05-26 NOTE — Progress Notes (Signed)
[place] PHYSICAL MEDICINE & REHABILITATION PROGRESS NOTE   Subjective/Complaints: Patient states he slept well overnight.  He notes improvement in bowel movements as well as bladder function.  ROS: Denies dysuria, CP, SOB, N/V/D  Objective:   No results found. No results for input(s): WBC, HGB, HCT, PLT in the last 72 hours. No results for input(s): NA, K, CL, CO2, GLUCOSE, BUN, CREATININE, CALCIUM in the last 72 hours.  Intake/Output Summary (Last 24 hours) at 05/26/2018 1234 Last data filed at 05/26/2018 1100 Gross per 24 hour  Intake 1200 ml  Output 1825 ml  Net -625 ml     Physical Exam: Vital Signs Blood pressure (!) 152/70, pulse 74, temperature 98.2 F (36.8 C), temperature source Oral, resp. rate 16, height 5\' 10"  (1.778 m), weight 71.6 kg, SpO2 100 %. Constitutional: NAD.  Vital signs reviewed. HENT: Normocephalic.  Atraumatic. Eyes: EOMI. No discharge. Cardiovascular: No JVD. Respiratory: Normal effort. GI: Non-distended. Musc: No edema or tenderness in extremities. Neurologic: Alert and oriented Motor: Grossly 4-4+/5 throughout, except for left ankle dorsiflexion 1/5, stable Skin: Warm and dry.  Intact.  Assessment/Plan: 1. Functional deficits secondary to debility with bilalteral foot drop which require 3+ hours per day of interdisciplinary therapy in a comprehensive inpatient rehab setting.  Physiatrist is providing close team supervision and 24 hour management of active medical problems listed below.  Physiatrist and rehab team continue to assess barriers to discharge/monitor patient progress toward functional and medical goals  Care Tool:  Bathing    Body parts bathed by patient: Right arm, Left arm, Chest, Abdomen, Front perineal area, Right upper leg, Left upper leg, Face   Body parts bathed by helper: Buttocks, Right lower leg, Left lower leg     Bathing assist Assist Level: Moderate Assistance - Patient 50 - 74%     Upper Body  Dressing/Undressing Upper body dressing   What is the patient wearing?: Pull over shirt    Upper body assist Assist Level: Minimal Assistance - Patient > 75%    Lower Body Dressing/Undressing Lower body dressing      What is the patient wearing?: Pants     Lower body assist Assist for lower body dressing: Moderate Assistance - Patient 50 - 74%     Toileting Toileting    Toileting assist Assist for toileting: Minimal Assistance - Patient > 75%     Transfers Chair/bed transfer  Transfers assist     Chair/bed transfer assist level: Minimal Assistance - Patient > 75%     Locomotion Ambulation   Ambulation assist   Ambulation activity did not occur: Safety/medical concerns  Assist level: Contact Guard/Touching assist Assistive device: Walker-rolling Max distance: 50'   Walk 10 feet activity   Assist  Walk 10 feet activity did not occur: Safety/medical concerns  Assist level: Contact Guard/Touching assist Assistive device: Walker-rolling   Walk 50 feet activity   Assist Walk 50 feet with 2 turns activity did not occur: Safety/medical concerns  Assist level: Contact Guard/Touching assist Assistive device: Walker-rolling    Walk 150 feet activity   Assist Walk 150 feet activity did not occur: Safety/medical concerns         Walk 10 feet on uneven surface  activity   Assist Walk 10 feet on uneven surfaces activity did not occur: Safety/medical concerns         Wheelchair     Assist Will patient use wheelchair at discharge?: (TBD) Type of Wheelchair: Manual    Wheelchair assist level: Supervision/Verbal cueing  Max wheelchair distance: 150'    Wheelchair 50 feet with 2 turns activity    Assist        Assist Level: Supervision/Verbal cueing   Wheelchair 150 feet activity     Assist     Assist Level: Supervision/Verbal cueing    Medical Problem List and Plan: 1.Debilitysecondary to acute on chronic respiratory  failure.  Continue CIR  PRAFO LLE, likely will need AFO 2. Antithrombotics: -DVT/anticoagulation:Subcutaneous heparin -antiplatelet therapy: Aspirin 81 mg daily DC'd on 3/30 due to patient stating he was told to avoid aspirin and other anticoagulants due to GI bleed.  Pharmacy contacted-no results after extensive review, please see pharmacy note. 3. Pain Management:Tylenol as needed  Chronic low back pain- tylenol not helping ,added  Methocarbamol with benefit.  K pad ordered 4. Mood:Provide emotional support -antipsychotic agents: N/A 5. Neuropsych: This patientiscapable of making decisions on hisown behalf. 6. Skin/Wound Care:routine skin checks 7. Fluids/Electrolytes/Nutrition:routineI's and O's   On D2 with honey liquids, advanced to D2 thins, advanced to D3 thins  Supplement with HS IVF 1/2ns 50cc/hr DC'd 8. Atrial fibrillation. Cardizem 30 mg twice a day, metoprolol 12.5 mg twice a day.   Confirmed with most recent ECG, reviewed 9. Hypertension. Hydralazine 20 mg every 8 hours, Norvasc 5 mg daily Vitals:   05/26/18 0357 05/26/18 0837  BP: (!) 154/68 (!) 152/70  Pulse: 75 74  Resp: 16   Temp: 98.2 F (36.8 C)   SpO2: 100%    ?  Trending up on 4/5, will consider medication adjustments if persistently elevated. 10.  Steroid-induced hyperglycemia with   Lantus insulin 7 units daily.  CBG (last 3)  Recent Labs    05/25/18 2154 05/26/18 0623 05/26/18 1130  GLUCAP 139* 80 99   CBGs labile, but?  Improving on 4/5  Hemoglobin A1c 5.0 on 3/8  Monitor with steroid taper 11. Hyperlipidemia. Lipitor 12. Acute on chronic respiratory failure: Slow taper, decreased to 30 on 4/1, decreased again on 4/5 13.  Anemia normocytic  Hemoglobin 8.8 on 3/30  Labs ordered for tomorrow  Continue to monitor 14.  Hypoalbuminemia  Supplement initiated on 3/30 15.  Leukocytosis-likely steroid-induced  WBCs 11.6 on 3/30  Labs ordered for  tomorrow  Afebrile 16.  Urinary frequency, predominantly at night  Continue Flomax, increased on 4/1  PVRs suggesting retention  Bethanechol 10 nightly started on 3/31, increased to 3 times daily on 4/1  Improving overall 17.  Dry feet  Lotion ordered- improving  LOS: 9 days A FACE TO FACE EVALUATION WAS PERFORMED   Karis Juba 05/26/2018, 12:34 PM

## 2018-05-26 NOTE — Progress Notes (Signed)
Physical Therapy Session Note  Patient Details  Name: BETTIE SWAVELY MRN: [id number] Date of Birth: [birth year]  Today's Date: 05/26/2018 PT Individual Time: 1405-1430 PT Individual Time Calculation (min): 25 min   Short Term Goals: Week 1:  PT Short Term Goal 1 (Week 1): Pt will perform sit<>stand transfer w/ mod assist  PT Short Term Goal 1 - Progress (Week 1): Met PT Short Term Goal 2 (Week 1): Pt will perform bed<>chair transfer w/ min assist PT Short Term Goal 2 - Progress (Week 1): Met PT Short Term Goal 3 (Week 1): Pt will ambulate 15' w/ LRAD w/ min assist PT Short Term Goal 3 - Progress (Week 1): Met PT Short Term Goal 4 (Week 1): Pt will participate in 30 min of OOB activity w/o increase in fatigue PT Short Term Goal 4 - Progress (Week 1): Met  Skilled Therapeutic Interventions/Progress Updates:  Pt was seen bedside in the pm. Pt transferred supine to edge of bed, edge of bed to supine with S and head of bed elevated with side rails. Pt performed sit to stand and stand pivot transfers with min A and verbal cues with rolling walker. Pt ambulated 20 feet and with rolling walker and c/g with verbal cues. Pt ambulated 8 feet with R PRAFO and rolling walker, required c/g and improved swing phase noted with PRAFO. Pt left sitting up in bed with bed alarm and call bell within reach at end of treatment.   Therapy Documentation Precautions:  Precautions Precautions: Fall Restrictions Weight Bearing Restrictions: No General:   Pain: Pt c/o 5/10 back pain.    Therapy/Group: Individual Therapy  Dub Amis 05/26/2018, 3:35 PM

## 2018-05-27 ENCOUNTER — Inpatient Hospital Stay (HOSPITAL_COMMUNITY): Payer: Medicare Other | Admitting: Speech Pathology

## 2018-05-27 ENCOUNTER — Inpatient Hospital Stay (HOSPITAL_COMMUNITY): Payer: Medicare Other | Admitting: Occupational Therapy

## 2018-05-27 ENCOUNTER — Inpatient Hospital Stay (HOSPITAL_COMMUNITY): Payer: Medicare Other | Admitting: Physical Therapy

## 2018-05-27 LAB — CBC WITH DIFFERENTIAL/PLATELET
Abs Immature Granulocytes: 0.05 10*3/uL (ref 0.00–0.07)
Basophils Absolute: 0 10*3/uL (ref 0.0–0.1)
Basophils Relative: 0 %
Eosinophils Absolute: 0 10*3/uL (ref 0.0–0.5)
Eosinophils Relative: 0 %
HCT: 28.7 % — ABNORMAL LOW (ref 39.0–52.0)
Hemoglobin: 8.4 g/dL — ABNORMAL LOW (ref 13.0–17.0)
Immature Granulocytes: 1 %
Lymphocytes Relative: 15 %
Lymphs Abs: 1.2 10*3/uL (ref 0.7–4.0)
MCH: 26.4 pg (ref 26.0–34.0)
MCHC: 29.3 g/dL — ABNORMAL LOW (ref 30.0–36.0)
MCV: 90.3 fL (ref 80.0–100.0)
Monocytes Absolute: 0.4 10*3/uL (ref 0.1–1.0)
Monocytes Relative: 6 %
Neutro Abs: 5.9 10*3/uL (ref 1.7–7.7)
Neutrophils Relative %: 78 %
Platelets: 153 10*3/uL (ref 150–400)
RBC: 3.18 MIL/uL — ABNORMAL LOW (ref 4.22–5.81)
RDW: 16.5 % — ABNORMAL HIGH (ref 11.5–15.5)
WBC: 7.6 10*3/uL (ref 4.0–10.5)
nRBC: 0 % (ref 0.0–0.2)

## 2018-05-27 LAB — GLUCOSE, CAPILLARY
Glucose-Capillary: 82 mg/dL (ref 70–99)
Glucose-Capillary: 144 mg/dL — ABNORMAL HIGH (ref 70–99)
Glucose-Capillary: 189 mg/dL — ABNORMAL HIGH (ref 70–99)
Glucose-Capillary: 203 mg/dL — ABNORMAL HIGH (ref 70–99)

## 2018-05-27 NOTE — Progress Notes (Signed)
Speech Language Pathology Daily Session Note  Patient Details  Name: Richard Wang MRN: [id number] Date of Birth: January 24, 1946  Today's Date: 05/27/2018 SLP Individual Time: 1215-1305 SLP Individual Time Calculation (min): 50 min  Short Term Goals: Week 2: SLP Short Term Goal 1 (Week 2): Pt will utilize external memory aids and compensatory strategies to recall new, daily information with Mod I.  SLP Short Term Goal 2 (Week 2): Patient will consume trials of regular textures with efficient mastication and without overt s/s of aspiration over 2 sessions prior to upgrade.   Skilled Therapeutic Interventions: Skilled treatment session focused on dysphagia and cognitive goals. SLP facilitated session by providing skilled observation with trial tray of regular textures and thin liquids. Patient demonstrated efficient mastication with complete oral clearance. No overt s/s of aspiration noted with the exception of one overt cough noted with a mixed consistency (fruit cup). Recommend patient upgrade to regular textures. SLP also facilitated session by providing supervision verbal cues for recall of events from previous therapy sessions. Patient left upright in bed with alarm on and all needs within reach. Continue with current plan of care.      Pain    Therapy/Group: Individual Therapy  Tabetha Haraway 05/27/2018, 1:24 PM

## 2018-05-27 NOTE — Progress Notes (Signed)
[place] PHYSICAL MEDICINE & REHABILITATION PROGRESS NOTE   Subjective/Complaints: Patient seen laying in bed this morning.  He states he slept well overnight.  He notes improvement.  ROS: Denies dysuria, CP, SOB, N/V/D  Objective:   No results found. No results for input(s): WBC, HGB, HCT, PLT in the last 72 hours. No results for input(s): NA, K, CL, CO2, GLUCOSE, BUN, CREATININE, CALCIUM in the last 72 hours.  Intake/Output Summary (Last 24 hours) at 05/27/2018 0935 Last data filed at 05/27/2018 0830 Gross per 24 hour  Intake 600 ml  Output 1312 ml  Net -712 ml     Physical Exam: Vital Signs Blood pressure (!) 147/90, pulse 72, temperature 98.2 F (36.8 C), temperature source Oral, resp. rate 18, height 5\' 10"  (1.778 m), weight 71.6 kg, SpO2 100 %. Constitutional: NAD.  Vital signs reviewed. HENT: Normocephalic.  Atraumatic. Eyes: EOMI. No discharge. Cardiovascular: No JVD. Respiratory: Normal effort. GI: Non-distended. Musc: No edema or tenderness in extremities. Neurologic: Alert and oriented Motor: Grossly 4-4+/5 throughout, except for left ankle dorsiflexion 1/5, stable Skin: Warm and dry.  Intact.  Assessment/Plan: 1. Functional deficits secondary to debility with bilalteral foot drop which require 3+ hours per day of interdisciplinary therapy in a comprehensive inpatient rehab setting.  Physiatrist is providing close team supervision and 24 hour management of active medical problems listed below.  Physiatrist and rehab team continue to assess barriers to discharge/monitor patient progress toward functional and medical goals  Care Tool:  Bathing    Body parts bathed by patient: Right arm, Left arm, Chest, Abdomen, Front perineal area, Right upper leg, Left upper leg, Face   Body parts bathed by helper: Buttocks, Right lower leg, Left lower leg     Bathing assist Assist Level: Moderate Assistance - Patient 50 - 74%     Upper Body Dressing/Undressing Upper  body dressing   What is the patient wearing?: Pull over shirt    Upper body assist Assist Level: Minimal Assistance - Patient > 75%    Lower Body Dressing/Undressing Lower body dressing      What is the patient wearing?: Pants     Lower body assist Assist for lower body dressing: Moderate Assistance - Patient 50 - 74%     Toileting Toileting    Toileting assist Assist for toileting: Minimal Assistance - Patient > 75%     Transfers Chair/bed transfer  Transfers assist     Chair/bed transfer assist level: Minimal Assistance - Patient > 75%     Locomotion Ambulation   Ambulation assist   Ambulation activity did not occur: Safety/medical concerns  Assist level: Contact Guard/Touching assist Assistive device: Walker-rolling Max distance: 20   Walk 10 feet activity   Assist  Walk 10 feet activity did not occur: Safety/medical concerns  Assist level: Contact Guard/Touching assist Assistive device: Walker-rolling   Walk 50 feet activity   Assist Walk 50 feet with 2 turns activity did not occur: Safety/medical concerns  Assist level: Contact Guard/Touching assist Assistive device: Walker-rolling    Walk 150 feet activity   Assist Walk 150 feet activity did not occur: Safety/medical concerns         Walk 10 feet on uneven surface  activity   Assist Walk 10 feet on uneven surfaces activity did not occur: Safety/medical concerns         Wheelchair     Assist Will patient use wheelchair at discharge?: (TBD) Type of Wheelchair: Manual    Wheelchair assist level: Supervision/Verbal cueing  Max wheelchair distance: 150'    Wheelchair 50 feet with 2 turns activity    Assist        Assist Level: Supervision/Verbal cueing   Wheelchair 150 feet activity     Assist     Assist Level: Supervision/Verbal cueing    Medical Problem List and Plan: 1.Debilitysecondary to acute on chronic respiratory failure.  Continue  CIR  PRAFO LLE, likely will need AFO 2. Antithrombotics: -DVT/anticoagulation:Subcutaneous heparin -antiplatelet therapy: Aspirin 81 mg daily DC'd on 3/30 due to patient stating he was told to avoid aspirin and other anticoagulants due to GI bleed.  Pharmacy contacted-no results after extensive review, please see pharmacy note. 3. Pain Management:Tylenol as needed  Chronic low back pain- tylenol not helping ,added  Methocarbamol with benefit.  K pad ordered 4. Mood:Provide emotional support -antipsychotic agents: N/A 5. Neuropsych: This patientiscapable of making decisions on hisown behalf. 6. Skin/Wound Care:routine skin checks 7. Fluids/Electrolytes/Nutrition:routineI's and O's   On D2 with honey liquids, advanced to D2 thins, advanced to D3 thins  Supplement with HS IVF 1/2ns 50cc/hr DC'd 8. Atrial fibrillation. Cardizem 30 mg twice a day, metoprolol 12.5 mg twice a day.   Confirmed with most recent ECG, reviewed 9. Hypertension. Hydralazine 20 mg every 8 hours, Norvasc 5 mg daily Vitals:   05/26/18 2025 05/27/18 0623  BP: 138/90 (!) 147/90  Pulse: 85 72  Resp: 18 18  Temp: 98.2 F (36.8 C)   SpO2: 100% 100%   Elevated, but improving control on 4/6 10.  Steroid-induced hyperglycemia   Lantus insulin 7 units daily.  CBG (last 3)  Recent Labs    05/26/18 1608 05/26/18 2111 05/27/18 0618  GLUCAP 191* 207* 82   CBGs labile on 4/6  Hemoglobin A1c 5.0 on 3/8  Monitor with steroid taper 11. Hyperlipidemia. Lipitor 12. Acute on chronic respiratory failure: Slow taper, decreased to 30 on 4/1, decreased again on 4/5 13.  Anemia normocytic  Hemoglobin 8.8 on 3/30  Labs pending  Continue to monitor 14.  Hypoalbuminemia  Supplement initiated on 3/30 15.  Leukocytosis-likely steroid-induced  WBCs 11.6 on 3/30  Labs pending  Afebrile 16.  Urinary frequency, predominantly at night  Continue Flomax, increased on 4/1  PVRs  suggesting retention  Bethanechol 10 nightly started on 3/31, increased to 3 times daily on 4/1  Improving overall 17.  Dry feet  Lotion ordered- improving  LOS: 10 days A FACE TO FACE EVALUATION WAS PERFORMED   Karis Juba 05/27/2018, 9:35 AM

## 2018-05-27 NOTE — Plan of Care (Signed)
  Problem: Consults Goal: RH GENERAL PATIENT EDUCATION Description See Patient Education module for education specifics. Outcome: Progressing Goal: Diabetes Guidelines if Diabetic/Glucose > 140 Description If diabetic or lab glucose is > 140 mg/dl - Initiate Diabetes/Hyperglycemia Guidelines & Document Interventions  Outcome: Progressing   Problem: RH BLADDER ELIMINATION Goal: RH STG MANAGE BLADDER WITH ASSISTANCE Description STG Manage Bladder With min Assistance  Outcome: Progressing Flowsheets (Taken 05/27/2018 1322) STG: Pt will manage bladder with assistance: 4-Minimal assistance   Problem: RH SKIN INTEGRITY Goal: RH STG SKIN FREE OF INFECTION/BREAKDOWN Description Patients skin will remain free from further infection or breakdown with mod I assist.     Outcome: Progressing Goal: RH STG MAINTAIN SKIN INTEGRITY WITH ASSISTANCE Description STG Maintain Skin Integrity With mod I Assistance.  Outcome: Progressing Flowsheets (Taken 05/27/2018 1322) STG: Maintain skin integrity with assistance: 4-Minimal assistance Goal: RH STG ABLE TO PERFORM INCISION/WOUND CARE W/ASSISTANCE Description STG Able To Perform Incision/Wound Care With mod I Assistance.  Outcome: Progressing Flowsheets (Taken 05/27/2018 1322) STG: Pt will be able to perform incision/wound care with assistance: 4-Minimal assistance   Problem: RH SAFETY Goal: RH STG ADHERE TO SAFETY PRECAUTIONS W/ASSISTANCE/DEVICE Description STG Adhere to Safety Precautions With mod I Assistance/Device.  Outcome: Progressing Flowsheets (Taken 05/27/2018 1322) STG:Pt will adhere to safety precautions with assistance/device: 4-Minimal assistance   Problem: RH BOWEL ELIMINATION Goal: RH STG MANAGE BOWEL WITH ASSISTANCE Description STG Manage Bowel with min Assistance.  Outcome: Progressing

## 2018-05-27 NOTE — Progress Notes (Signed)
Physical Therapy Session Note  Patient Details  Name: Richard Wang MRN: [id number] Date of Birth: June 20, 1945  Today's Date: 05/27/2018 PT Individual Time: 0915-1010 AND 1330-1410 PT Individual Time Calculation (min): 55 min AND 40 min  Short Term Goals: Week 2:  PT Short Term Goal 1 (Week 2): Pt will perform sit<>stands w/ min assist  PT Short Term Goal 2 (Week 2): Pt will initiate stair training for funcitonal strengthening PT Short Term Goal 3 (Week 2): Pt will perform bed<>chair transfers w/ min assist 100% of the time PT Short Term Goal 4 (Week 2): Pt will maintain dynamic standing balance w/ min assist  Skilled Therapeutic Interventions/Progress Updates:   Session 1:  Pt in supine and agreeable to therapy, pain 4-5/10 in lower back (premedicated). Supervision bed mobility, min assist sit>stand. Ambulated to/from therapy gym in multiple 50' bouts w/ close supervision-CGA w/ w/c follow. Worked on sit<>stands w/o UE support to stabilize in stance and then static standing balance w/o UE support while performing bilateral reaching tasks. Tasks included card matching and pinning clothespins, CGA-min assist for balance w/ intermittent UE support in 3-4 min bouts. Pt unable to stabilize w/ any postural perturbation w/o immediately reaching for RW. Returned to room and ended session in supine, all needs in reach.  Session 2:   Pt in supine and agreeable to therapy, ongoing 4-5/10 pain in lower back, denies intervention. Supervision bed mobility and min assist bed<>chair transfer. Pt self-propelled w/c to/from therapy gym w/ BUEs to work on global endurance and strengthening. NuStep 6 min x2 @ level 3 for LE strengthening and endurance training. Pt visibly upset about his weakness continuing to be a limiting factor, educated on effects of prolonged immobility and recovery timeline. Returned to room and ended session in supine, all needs in reach.  Therapy Documentation Precautions:   Precautions Precautions: Fall Restrictions Weight Bearing Restrictions: No Vital Signs: Therapy Vitals Pulse Rate: 72 Resp: 18 BP: (!) 147/90 Patient Position (if appropriate): Lying Oxygen Therapy SpO2: 100 % O2 Device: Room Air  Therapy/Group: Individual Therapy  Jarmon Javid K Katoya Amato 05/27/2018, 10:08 AM

## 2018-05-27 NOTE — Progress Notes (Signed)
Occupational Therapy Session Note  Patient Details  Name: Richard Wang MRN: [id number] Date of Birth: [birth year]  Today's Date: 05/27/2018 OT Individual Time: 1100-1125 OT Individual Time Calculation (min): 25 min    Short Term Goals: Week 2:  OT Short Term Goal 1 (Week 2): Pt will tolerate standing for 5 mins in preparation for BADL tasks OT Short Term Goal 2 (Week 2): Pt will complete shower transfer with min A OT Short Term Goal 3 (Week 2): Pt will complete 2/3 toileting steps   Skilled Therapeutic Interventions/Progress Updates:    Treatment session with focus on sit <> stand, overall strengthening, and dynamic standing balance.  Pt received supine in bed agreeable to therapy session.  Engaged in sit > stand from EOB requiring min assist and occasionally more than one attempt to come to complete standing.  Engaged in chest presses, overhead presses, and PNF diagonals in standing with min assist to challenge standing balance, endurance, and strengthening of BUE.  Pt reports pain in lower back with prolonged standing.  Pt returned to supine at end of session as RN arriving to scan bladder.  Pt left with RN present.   Therapy Documentation Precautions:  Precautions Precautions: Fall Restrictions Weight Bearing Restrictions: No Pain:  Pt with c/o pain in lower back, premedicated.   Therapy/Group: Individual Therapy  Rosalio Loud 05/27/2018, 12:01 PM

## 2018-05-27 NOTE — Progress Notes (Signed)
Pt asleep relief of back pain and spasm with Robaxin. Pt voided without difficulty Bladder scan within normal limits.

## 2018-05-28 ENCOUNTER — Inpatient Hospital Stay (HOSPITAL_COMMUNITY): Payer: Medicare Other | Admitting: Occupational Therapy

## 2018-05-28 ENCOUNTER — Inpatient Hospital Stay (HOSPITAL_COMMUNITY): Payer: Medicare Other | Admitting: Speech Pathology

## 2018-05-28 ENCOUNTER — Inpatient Hospital Stay (HOSPITAL_COMMUNITY): Payer: Medicare Other | Admitting: Physical Therapy

## 2018-05-28 LAB — GLUCOSE, CAPILLARY
Glucose-Capillary: 105 mg/dL — ABNORMAL HIGH (ref 70–99)
Glucose-Capillary: 132 mg/dL — ABNORMAL HIGH (ref 70–99)
Glucose-Capillary: 206 mg/dL — ABNORMAL HIGH (ref 70–99)
Glucose-Capillary: 184 mg/dL — ABNORMAL HIGH (ref 70–99)

## 2018-05-28 MED ORDER — SIMETHICONE 80 MG PO CHEW
80.0000 mg | CHEWABLE_TABLET | Freq: Four times a day (QID) | ORAL | Status: DC | PRN
  Administered 2018-05-28: 80 mg via ORAL
  Filled 2018-05-28: qty 1

## 2018-05-28 MED ORDER — AMLODIPINE BESYLATE 2.5 MG PO TABS
2.5000 mg | ORAL_TABLET | Freq: Once | ORAL | Status: AC
  Administered 2018-05-28: 2.5 mg via ORAL
  Filled 2018-05-28: qty 1

## 2018-05-28 MED ORDER — AMLODIPINE BESYLATE 5 MG PO TABS
7.5000 mg | ORAL_TABLET | Freq: Every day | ORAL | Status: DC
  Administered 2018-05-29 – 2018-06-02 (×5): 7.5 mg via ORAL
  Filled 2018-05-28 (×5): qty 1

## 2018-05-28 NOTE — Progress Notes (Signed)
[place] PHYSICAL MEDICINE & REHABILITATION PROGRESS NOTE   Subjective/Complaints: Patient seen sitting up in bed in this AM.  He states he slept well overnight. He state he is doing better.  ROS: Denies dysuria, CP, SOB, N/V/D  Objective:   No results found. Recent Labs    05/27/18 0916  WBC 7.6  HGB 8.4*  HCT 28.7*  PLT 153   No results for input(s): NA, K, CL, CO2, GLUCOSE, BUN, CREATININE, CALCIUM in the last 72 hours.  Intake/Output Summary (Last 24 hours) at 05/28/2018 0827 Last data filed at 05/28/2018 0517 Gross per 24 hour  Intake 480 ml  Output 900 ml  Net -420 ml     Physical Exam: Vital Signs Blood pressure (!) 154/82, pulse 79, temperature 98.3 F (36.8 C), temperature source Oral, resp. rate 19, height 5\' 10"  (1.778 m), weight 71.6 kg, SpO2 100 %. Constitutional: NAD.  Vital signs reviewed. HENT: Normocephalic.  Atraumatic. Eyes: EOMI.  No discharge. Cardiovascular: No JVD. Respiratory: Normal effort. GI: Non-distended. Musc: No edema or tenderness in extremities. Neurologic: Alert and oriented Motor: Grossly 4-4+/5 throughout, except for left ankle dorsiflexion 1/5, unchanged Skin: Warm and dry.  Intact.  Assessment/Plan: 1. Functional deficits secondary to debility with bilalteral foot drop which require 3+ hours per day of interdisciplinary therapy in a comprehensive inpatient rehab setting.  Physiatrist is providing close team supervision and 24 hour management of active medical problems listed below.  Physiatrist and rehab team continue to assess barriers to discharge/monitor patient progress toward functional and medical goals  Care Tool:  Bathing    Body parts bathed by patient: Right arm, Left arm, Chest, Abdomen, Front perineal area, Right upper leg, Left upper leg, Face   Body parts bathed by helper: Buttocks, Right lower leg, Left lower leg     Bathing assist Assist Level: Moderate Assistance - Patient 50 - 74%     Upper Body  Dressing/Undressing Upper body dressing   What is the patient wearing?: Pull over shirt    Upper body assist Assist Level: Minimal Assistance - Patient > 75%    Lower Body Dressing/Undressing Lower body dressing      What is the patient wearing?: Pants     Lower body assist Assist for lower body dressing: Moderate Assistance - Patient 50 - 74%     Toileting Toileting    Toileting assist Assist for toileting: Minimal Assistance - Patient > 75%     Transfers Chair/bed transfer  Transfers assist     Chair/bed transfer assist level: Minimal Assistance - Patient > 75%     Locomotion Ambulation   Ambulation assist   Ambulation activity did not occur: Safety/medical concerns  Assist level: Contact Guard/Touching assist Assistive device: Walker-rolling Max distance: 50'   Walk 10 feet activity   Assist  Walk 10 feet activity did not occur: Safety/medical concerns  Assist level: Contact Guard/Touching assist Assistive device: Walker-rolling   Walk 50 feet activity   Assist Walk 50 feet with 2 turns activity did not occur: Safety/medical concerns  Assist level: Contact Guard/Touching assist Assistive device: Walker-rolling    Walk 150 feet activity   Assist Walk 150 feet activity did not occur: Safety/medical concerns         Walk 10 feet on uneven surface  activity   Assist Walk 10 feet on uneven surfaces activity did not occur: Safety/medical concerns         Wheelchair     Assist Will patient use wheelchair at discharge?: (  TBD) Type of Wheelchair: Manual    Wheelchair assist level: Supervision/Verbal cueing Max wheelchair distance: 150'    Wheelchair 50 feet with 2 turns activity    Assist        Assist Level: Supervision/Verbal cueing   Wheelchair 150 feet activity     Assist     Assist Level: Supervision/Verbal cueing    Medical Problem List and Plan: 1.Debilitysecondary to acute on chronic respiratory  failure.  Continue CIR  PRAFO LLE, likely will need AFO 2. Antithrombotics: -DVT/anticoagulation:Subcutaneous heparin -antiplatelet therapy: Aspirin 81 mg daily DC'd on 3/30 due to patient stating he was told to avoid aspirin and other anticoagulants due to GI bleed.  Pharmacy contacted-no results after extensive review, please see pharmacy note. 3. Pain Management:Tylenol as needed  Chronic low back pain- tylenol not helping ,added  Methocarbamol with benefit.  K pad ordered  Relatively controlled on 4/7 4. Mood:Provide emotional support -antipsychotic agents: N/A 5. Neuropsych: This patientiscapable of making decisions on hisown behalf. 6. Skin/Wound Care:routine skin checks 7. Fluids/Electrolytes/Nutrition:routineI's and O's   On D2 with honey liquids, advanced to D2 thins, advanced to D3 thins  Supplement with HS IVF 1/2ns 50cc/hr DC'd 8. Atrial fibrillation. Cardizem 30 mg twice a day, metoprolol 12.5 mg twice a day.   Confirmed with most recent ECG, reviewed 9. Hypertension. Hydralazine 20 mg every 8 hours  Norvasc 5 mg daily, increased to 7.5 on 4/7 Vitals:   05/27/18 1958 05/28/18 0517  BP: (!) 143/78 (!) 154/82  Pulse: 87 79  Resp: 17 19  Temp: 98.3 F (36.8 C) 98.3 F (36.8 C)  SpO2: 99% 100%  10.  Steroid-induced hyperglycemia   Lantus insulin 7 units daily.  CBG (last 3)  Recent Labs    05/27/18 1612 05/27/18 2055 05/28/18 0624  GLUCAP 189* 203* 105*   CBGs labile on 4/7  Hemoglobin A1c 5.0 on 3/8  Monitor with steroid taper 11. Hyperlipidemia. Lipitor 12. Acute on chronic respiratory failure: Slow taper, decreased to 30 on 4/1, decreased again on 4/5 13.  Anemia normocytic  Hemoglobin 8.4 on 4/6  Continue to monitor 14.  Hypoalbuminemia  Supplement initiated on 3/30 15.  Leukocytosis-likely steroid-induced resolved  WBCs 7.6 on 4/6  Afebrile 16.  Urinary frequency, predominantly at night  Continue  Flomax, increased on 4/1  PVRs suggesting retention  Bethanechol 10 nightly started on 3/31, increased to 3 times daily on 4/1  Improving overall 17.  Dry feet  Lotion ordered- improving  LOS: 11 days A FACE TO FACE EVALUATION WAS PERFORMED  Richard Wang Richard Wang 05/28/2018, 8:27 AM

## 2018-05-28 NOTE — Plan of Care (Signed)
  Problem: Consults Goal: RH GENERAL PATIENT EDUCATION Description See Patient Education module for education specifics. Outcome: Progressing Goal: Diabetes Guidelines if Diabetic/Glucose > 140 Description If diabetic or lab glucose is > 140 mg/dl - Initiate Diabetes/Hyperglycemia Guidelines & Document Interventions  Outcome: Progressing   Problem: RH BLADDER ELIMINATION Goal: RH STG MANAGE BLADDER WITH ASSISTANCE Description STG Manage Bladder With min Assistance  Outcome: Progressing Flowsheets (Taken 05/28/2018 1348) STG: Pt will manage bladder with assistance: 5-Supervision/cueing   Problem: RH SKIN INTEGRITY Goal: RH STG SKIN FREE OF INFECTION/BREAKDOWN Description Patients skin will remain free from further infection or breakdown with mod I assist.     Outcome: Progressing Goal: RH STG MAINTAIN SKIN INTEGRITY WITH ASSISTANCE Description STG Maintain Skin Integrity With mod I Assistance.  Outcome: Progressing Flowsheets (Taken 05/28/2018 1348) STG: Maintain skin integrity with assistance: 5-Supervision/cueing Goal: RH STG ABLE TO PERFORM INCISION/WOUND CARE W/ASSISTANCE Description STG Able To Perform Incision/Wound Care With mod I Assistance.  Outcome: Progressing Flowsheets (Taken 05/28/2018 1348) STG: Pt will be able to perform incision/wound care with assistance: 5-Supervision/cueing   Problem: RH SAFETY Goal: RH STG ADHERE TO SAFETY PRECAUTIONS W/ASSISTANCE/DEVICE Description STG Adhere to Safety Precautions With mod I Assistance/Device.  Outcome: Progressing Flowsheets (Taken 05/28/2018 1348) STG:Pt will adhere to safety precautions with assistance/device: 5-Supervision/cueing   Problem: RH BOWEL ELIMINATION Goal: RH STG MANAGE BOWEL WITH ASSISTANCE Description STG Manage Bowel with min Assistance.  Outcome: Progressing Flowsheets (Taken 05/28/2018 1348) STG: Pt will manage bowels with assistance: 5-Supervision/curing

## 2018-05-28 NOTE — Progress Notes (Signed)
Occupational Therapy Session Note  Patient Details  Name: Richard Wang MRN: [id number] Date of Birth: 1945-06-05  Today's Date: 05/28/2018 OT Individual Time: 1735-6701 OT Individual Time Calculation (min): 70 min    Short Term Goals: Week 2:  OT Short Term Goal 1 (Week 2): Pt will tolerate standing for 5 mins in preparation for BADL tasks OT Short Term Goal 2 (Week 2): Pt will complete shower transfer with min A OT Short Term Goal 3 (Week 2): Pt will complete 2/3 toileting steps   Skilled Therapeutic Interventions/Progress Updates:    Pt greeted semi-reclined in bed and agreeable to OT treatment session. Pt requests to shower today. Sit<>stand w/ RW and min A/. Pt ambulated from bed to shower bench with min A and RW. Pt sat on tub bench and doffed clothing with min A. Bathing completed seated on tub bench using leaning method to wash buttocks. Min A to wash LEs. Dressing completed from wc with min A for LB dressing and set-up for UB dressing. Pt needed extended rest breaks within all BADL tasks.  UB strengthening w/ wc propulsion to therapy gym. Worked on sit<>stands, standing balance/endurance with alternating toe taps on bean bags. Progressed to alternating UE reaching outside base of support in standing in prep for BADL tasks.  Pt returned to room at end of session and completed stand-pivot back to bed with min A> Pt left semi-reclined in bed with bed alarm on and needs met.   Therapy Documentation Precautions:  Precautions Precautions: Fall Restrictions Weight Bearing Restrictions: No Pain: Pain Assessment Pain Scale: 0-10 Pain Score: 4  Pain Type: Chronic pain Pain Location: Back Pain Orientation: Lower Pain Descriptors / Indicators: Aching Pain Onset: On-going Pain Intervention(s): Heat applied;Repositioned   Therapy/Group: Individual Therapy  Valma Cava 05/28/2018, 3:46 PM

## 2018-05-28 NOTE — Progress Notes (Signed)
Speech Language Pathology Discharge Summary  Patient Details  Name: Richard Wang MRN: [id number] Date of Birth: 10-Jan-1946  Today's Date: 05/28/2018 SLP Individual Time: 1346-1410 SLP Individual Time Calculation (min): 24 min   Skilled Therapeutic Interventions:  Pt was seen for skilled ST targeting dysphagia and cognition goals.  SLP facilitated the session with a functional snack of regular textures and thin liquids to assess toleration of current diet.  Pt demonstrated no overt s/s of aspiration with solids or liquids and pt's oral phase was functional for containment, transit, and clearance of boluses from the oral cavity post swallow.  As a result, no further ST needs are indicated at this time.  SLP also re-administered the MoCA-Blind (7.2)  to measure progress from initial evaluation.  Pt scored 18/22 (n >/=18) which is likely indicative of baseline performance given that his score has not changed since being admitted.  Pt denies any acute cognitive changes from baseline.  No further ST needs indicated for cognition at this time.      Patient has met 6 of 6 long term goals.  Patient to discharge at overall Modified Independent;Supervision level.  Reasons goals not met:     Clinical Impression/Discharge Summary:   Pt has made functional gains while inpatient and is discharging from [facility] services having met 6 out of 6 long term goals.  Pt is currently mod I-supervision for tasks due to mild baseline memory issues (per report) and is consuming regular textures, thin liquids diet with mod I use of swallowing precautions.  Pt education is complete at this time.  Pt has demonstrated improved swallowing function as evidenced by excellent toleration of diet upgrade. No further ST needs indicated at this time as pt is at baseline for cognition and swallowing function.    Care Partner:  Caregiver Able to Provide Assistance: (n/a)  Type of Caregiver Assistance: (n/a)  Recommendation:  None       Equipment: none recommended by SLP   Reasons for discharge: Treatment goals met   Patient/Family Agrees with Progress Made and Goals Achieved: Yes    Emilio Math 05/28/2018, 3:48 PM

## 2018-05-28 NOTE — Progress Notes (Signed)
Occupational Therapy Session Note  Patient Details  Name: Richard Wang MRN: [id number] Date of Birth: [birth year]  Today's Date: 05/28/2018 OT Individual Time: 4742-5956 OT Individual Time Calculation (min): 45 min    Short Term Goals: Week 2:  OT Short Term Goal 1 (Week 2): Pt will tolerate standing for 5 mins in preparation for BADL tasks OT Short Term Goal 2 (Week 2): Pt will complete shower transfer with min A OT Short Term Goal 3 (Week 2): Pt will complete 2/3 toileting steps   Skilled Therapeutic Interventions/Progress Updates:    Pt received in w/c and stated he really wasn't in the mood to do a shower right now but would like to do it during his afternoon session.  Pt agreeable to exercises to work on his sit to stand skills. Pt self propelled to the gym and used his RW to transfer to the mat with CGA.  Pt relies heavily on his UB to push up to stand using RW. Worked on strategy of pushing up through arm rests, forward wt shift and then reaching for walker. Pt did well with this movement.  On mat, continued forward wt shifts and pushing up using push up blocks. Focused on position of feet. Pt completed 5 reps at a time for several sets. Only 5 reps as he has low back pain.  L ankle mobility with ankle rotations and heel raises with moving foot inward and outward.   His PT arrived for his next session.    Therapy Documentation Precautions:  Precautions Precautions: Fall Restrictions Weight Bearing Restrictions: No      Pain: Pain Assessment Pain Scale: 0-10 Pain Score: 0-No pain   Therapy/Group: Individual Therapy  Kody Brandl 05/28/2018, 11:45 AM

## 2018-05-28 NOTE — Progress Notes (Signed)
Physical Therapy Session Note  Patient Details  Name: Richard Wang MRN: [id number] Date of Birth: 1945/06/27  Today's Date: 05/28/2018 PT Individual Time: 0920-1030 PT Individual Time Calculation (min): 70 min   Short Term Goals: Week 2:  PT Short Term Goal 1 (Week 2): Pt will perform sit<>stands w/ min assist  PT Short Term Goal 2 (Week 2): Pt will initiate stair training for funcitonal strengthening PT Short Term Goal 3 (Week 2): Pt will perform bed<>chair transfers w/ min assist 100% of the time PT Short Term Goal 4 (Week 2): Pt will maintain dynamic standing balance w/ min assist  Skilled Therapeutic Interventions/Progress Updates:   Pt received in therapy gym, handoff from OT. Agreeable to therapy, pain 4-5/10 in lower back. Worked on sit<>stands from mat surface and trailing various techniques on hand placement, worked on being able to push up vs pull up on RW. Performed 2 sets of 5, min assist each. Also focused on set-up of sit>stand by bringing feet more underneath him, increased calf stretch in this position bilaterally. Took rest breaks in this position to work on Building surveyor. Static standing on red foam wedge to work on heel cord lengthening, 3x15 sec. LE strengthening including partial knee bends w/ unilateral UE support and knee marches in seated, 3x10 each.  NuStep 6 min x2 @ level 4 for global strengthening and endurance training. Encouraged pt to keep feet lower on pedals to work on gastroc stretching. Pt self-propelled w/c back to room, ended session in supine, all needs in reach.  Therapy Documentation Precautions:  Precautions Precautions: Fall Restrictions Weight Bearing Restrictions: No  Therapy/Group: Individual Therapy  Asani Deniston K Mckinsley Koelzer 05/28/2018, 11:06 AM

## 2018-05-29 ENCOUNTER — Inpatient Hospital Stay (HOSPITAL_COMMUNITY): Payer: Medicare Other | Admitting: Physical Therapy

## 2018-05-29 ENCOUNTER — Inpatient Hospital Stay (HOSPITAL_COMMUNITY): Payer: Medicare Other | Admitting: Occupational Therapy

## 2018-05-29 LAB — GLUCOSE, CAPILLARY
Glucose-Capillary: 95 mg/dL (ref 70–99)
Glucose-Capillary: 100 mg/dL — ABNORMAL HIGH (ref 70–99)
Glucose-Capillary: 169 mg/dL — ABNORMAL HIGH (ref 70–99)
Glucose-Capillary: 190 mg/dL — ABNORMAL HIGH (ref 70–99)

## 2018-05-29 MED ORDER — PREDNISONE 10 MG PO TABS
10.0000 mg | ORAL_TABLET | Freq: Every day | ORAL | Status: DC
  Administered 2018-05-30 – 2018-06-02 (×4): 10 mg via ORAL
  Filled 2018-05-29 (×4): qty 1

## 2018-05-29 NOTE — Progress Notes (Addendum)
[place] PHYSICAL MEDICINE & REHABILITATION PROGRESS NOTE   Subjective/Complaints: Patient seen sitting up in bed this morning.  He states he slept well overnight.  He notes significant improvement in bladder function, particularly over the last 2 days.  ROS: Denies CP, SOB, N/V/D  Objective:   No results found. Recent Labs    05/27/18 0916  WBC 7.6  HGB 8.4*  HCT 28.7*  PLT 153   No results for input(s): NA, K, CL, CO2, GLUCOSE, BUN, CREATININE, CALCIUM in the last 72 hours.  Intake/Output Summary (Last 24 hours) at 05/29/2018 0837 Last data filed at 05/29/2018 0123 Gross per 24 hour  Intake 444 ml  Output 655 ml  Net -211 ml     Physical Exam: Vital Signs Blood pressure (!) 153/79, pulse 78, temperature 97.6 F (36.4 C), resp. rate 16, height 5\' 10"  (1.778 m), weight 71.6 kg, SpO2 100 %. Constitutional: NAD.  Vital signs reviewed. HENT: Normocephalic.  Atraumatic. Eyes: EOMI.  No discharge. Cardiovascular: No JVD. Respiratory: Normal effort. GI: Non-distended. Musc: No edema or tenderness in extremities. Neurologic: Alert and oriented Motor: Grossly 4-4+/5 throughout, except for left ankle dorsiflexion 1/5, stable Skin: Warm and dry.  Intact.  Assessment/Plan: 1. Functional deficits secondary to debility with bilalteral foot drop which require 3+ hours per day of interdisciplinary therapy in a comprehensive inpatient rehab setting.  Physiatrist is providing close team supervision and 24 hour management of active medical problems listed below.  Physiatrist and rehab team continue to assess barriers to discharge/monitor patient progress toward functional and medical goals  Care Tool:  Bathing    Body parts bathed by patient: Right arm, Left arm, Chest, Abdomen, Front perineal area, Right upper leg, Left upper leg, Face   Body parts bathed by helper: Buttocks, Right lower leg, Left lower leg     Bathing assist Assist Level: Moderate Assistance - Patient 50 -  74%     Upper Body Dressing/Undressing Upper body dressing   What is the patient wearing?: Pull over shirt    Upper body assist Assist Level: Minimal Assistance - Patient > 75%    Lower Body Dressing/Undressing Lower body dressing      What is the patient wearing?: Pants     Lower body assist Assist for lower body dressing: Moderate Assistance - Patient 50 - 74%     Toileting Toileting    Toileting assist Assist for toileting: Minimal Assistance - Patient > 75%     Transfers Chair/bed transfer  Transfers assist     Chair/bed transfer assist level: Minimal Assistance - Patient > 75%     Locomotion Ambulation   Ambulation assist   Ambulation activity did not occur: Safety/medical concerns  Assist level: Contact Guard/Touching assist Assistive device: Walker-rolling Max distance: 50'   Walk 10 feet activity   Assist  Walk 10 feet activity did not occur: Safety/medical concerns  Assist level: Contact Guard/Touching assist Assistive device: Walker-rolling   Walk 50 feet activity   Assist Walk 50 feet with 2 turns activity did not occur: Safety/medical concerns  Assist level: Contact Guard/Touching assist Assistive device: Walker-rolling    Walk 150 feet activity   Assist Walk 150 feet activity did not occur: Safety/medical concerns         Walk 10 feet on uneven surface  activity   Assist Walk 10 feet on uneven surfaces activity did not occur: Safety/medical concerns         Wheelchair     Assist Will patient use  wheelchair at discharge?: (TBD) Type of Wheelchair: Manual    Wheelchair assist level: Supervision/Verbal cueing Max wheelchair distance: 150'    Wheelchair 50 feet with 2 turns activity    Assist        Assist Level: Supervision/Verbal cueing   Wheelchair 150 feet activity     Assist     Assist Level: Supervision/Verbal cueing    Medical Problem List and Plan: 1.Debilitysecondary to acute  on chronic respiratory failure.  Continue CIR  PRAFO LLE, likely will need AFO  Team conference today to discuss current and goals and coordination of care, home and environmental barriers, and discharge planning with nursing, case manager, and therapies.  2. Antithrombotics: -DVT/anticoagulation:Subcutaneous heparin -antiplatelet therapy: Aspirin 81 mg daily DC'd on 3/30 due to patient stating he was told to avoid aspirin and other anticoagulants due to GI bleed.  Pharmacy contacted-no results after extensive review, please see pharmacy note. 3. Pain Management:Tylenol as needed  Chronic low back pain- tylenol not helping ,added  Methocarbamol with benefit.  K pad ordered  Relatively controlled on 4/8 4. Mood:Provide emotional support -antipsychotic agents: N/A 5. Neuropsych: This patientiscapable of making decisions on hisown behalf. 6. Skin/Wound Care:routine skin checks 7. Fluids/Electrolytes/Nutrition:routineI's and O's   On D2 with honey liquids, advanced to D2 thins, advanced to D3 thins  Supplement with HS IVF 1/2ns 50cc/hr DC'd   Labs ordered for tomorrow 8. Atrial fibrillation. Cardizem 30 mg twice a day, metoprolol 12.5 mg twice a day.   Confirmed with most recent ECG, reviewed 9. Hypertension. Hydralazine 20 mg every 8 hours  Norvasc 5 mg daily, increased to 7.5 on 4/7 Vitals:   05/28/18 1936 05/29/18 0638  BP: 133/76 (!) 153/79  Pulse: (!) 107 78  Resp: 16   Temp: 98.4 F (36.9 C) 97.6 F (36.4 C)  SpO2: 100% 100%   Labile on 4/8 10.  Steroid-induced hyperglycemia   Lantus insulin 7 units daily.  CBG (last 3)  Recent Labs    05/28/18 1624 05/28/18 2110 05/29/18 0657  GLUCAP 206* 184* 95   CBGs labile on 4/8  Hemoglobin A1c 5.0 on 3/8  Monitor with steroid taper 11. Hyperlipidemia. Lipitor 12. Acute on chronic respiratory failure: Slow taper, decreased to 30 on 4/1, decreased again on 4/5, decreased again on  4/9 13.  Anemia normocytic  Hemoglobin 8.4 on 4/6  Continue to monitor 14.  Hypoalbuminemia  Supplement initiated on 3/30 15.  Leukocytosis-likely steroid-induced resolved  WBCs 7.6 on 4/6  Afebrile 16.  Urinary frequency, predominantly at night  Continue Flomax, increased on 4/1  PVRs suggesting retention  Bethanechol 10 nightly started on 3/31, increased to 3 times daily on 4/1  Improving 17.  Dry feet  Lotion ordered- improving  LOS: 12 days A FACE TO FACE EVALUATION WAS PERFORMED  Richard Wang 05/29/2018, 8:37 AM

## 2018-05-29 NOTE — Progress Notes (Signed)
Physical Therapy Session Note  Patient Details  Name: Richard Wang MRN: [id number] Date of Birth: 1945/04/28  Today's Date: 05/29/2018 PT Individual Time: 1340-1420 PT Individual Time Calculation (min): 40 min   Short Term Goals: Week 2:  PT Short Term Goal 1 (Week 2): Pt will perform sit<>stands w/ min assist  PT Short Term Goal 2 (Week 2): Pt will initiate stair training for funcitonal strengthening PT Short Term Goal 3 (Week 2): Pt will perform bed<>chair transfers w/ min assist 100% of the time PT Short Term Goal 4 (Week 2): Pt will maintain dynamic standing balance w/ min assist  Skilled Therapeutic Interventions/Progress Updates:   Pt in recliner and agreeable to therapy, pain 5/10 in lower back and reports increased pain/soreness in lower back today. Min assist transfer to w/c. Pt self-propelled w/c to/from therapy gym w/ supervision using BUEs. Worked on gait training w/ LAFO as pt has shoes today. Ambulated 30' w/ LAFO and RW w/ much improved L foot clearance, safer overall gait, and decreased energy expenditure. Discussed home access, pt has ~100' of gravel to walk on before getting to aluminum ramp. Educated on safety risks of ambulating over gravel and/or grass to get to ramp, discussed need for w/c for that aspect however pt does not think he will need a w/c. Will continue to assess for safest home access technique. Practiced gait on ramp and uneven surfaces, CGA-min assist w/ increased energy expenditure on these surfaces and moderate increase in work of breathing. Blocked practice of sit<>stands to RW w/ CGA x5, emphasis on slow and controlled technique. Returned to room and ended session in supine, all needs in reach.   Therapy Documentation Precautions:  Precautions Precautions: Fall Restrictions Weight Bearing Restrictions: No  Therapy/Group: Individual Therapy  Younique Casad K Judas Mohammad 05/29/2018, 2:22 PM

## 2018-05-29 NOTE — Patient Care Conference (Signed)
Inpatient RehabilitationTeam Conference and Plan of Care Update Date: 05/29/2018   Time: 2:40 PM    Patient Name: Richard Wang      Medical Record Number: [id number]  Date of Birth: 14-Jan-1946 Sex: Male         Room/Bed: 4M04C/4M04C-01 Payor Info: Payor: MEDICARE / Plan: MEDICARE PART A AND B / Product Type: *No Product type* /    Admitting Diagnosis: Debility  Admit Date/Time:  05/17/2018  3:11 PM Admission Comments: No comment available   Primary Diagnosis:  <principal problem not specified> Principal Problem: <principal problem not specified>  Patient Active Problem List   Diagnosis Date Noted  . Acute blood loss anemia   . Labile blood glucose   . Urinary retention   . Chronic respiratory failure (HCC)   . Atrial fibrillation (HCC)   . Labile blood pressure   . Essential hypertension   . Steroid-induced hyperglycemia   . Normocytic anemia   . Acute on chronic respiratory failure (HCC)   . Leucocytosis   . Hypoalbuminemia due to protein-calorie malnutrition (HCC)   . Urinary frequency   . Debility 05/17/2018  . Acute and chronic respiratory failure with hypoxia (HCC)   . Acute systolic heart failure (HCC)   . Chronic atrial fibrillation   . Acute myocardial infarction, subendocardial infarction, subsequent episode of care (HCC)   . Unspecified septicemia(038.9) (HCC)   . Sepsis with acute organ dysfunction (HCC)   . Acute and chronic respiratory failure with hypoxia [facility])     Expected Discharge Date: Expected Discharge Date: 06/06/18  Team Members Present: Physician leading conference: Dr. Maryla Morrow Social Worker Present: Amada Jupiter, LCSW Nurse Present: Willey Blade, RN PT Present: Carlynn Purl, PT OT Present: Blanch Media, OT SLP Present: Feliberto Gottron, SLP PPS Coordinator present : Fae Pippin     Current Status/Progress Goal Weekly Team Focus  Medical   Debility secondary to acute on chronic respiratory failure  Improve HTN, DM, urinary retention   See above   Bowel/Bladder   LBM 4/7 PVR q8 Continent x2  Maintain regular bowel pattern.  assist with toileting needs   Swallow/Nutrition/ Hydration             ADL's   Min A overall, occasional mod A to get to standing  Supervision  general strengthening, self-care retraining, sit<>stands, transfers, activity tolerance   Mobility   min-mod assist sit<>stands, CGA-supervision once in standing  supervision overall  LE strength, endurance, all functional mobility, gait w/ LAFO   Communication             Safety/Cognition/ Behavioral Observations            Pain   c/o pain and muscle spasms to Lower back on robaxin q6 prn  pain less than 2  assess qshift and PRN   Skin   dry cracked heels, MASD to groin, ecchymosis to ABD  barrier cream qshift and PRN, Aquphora to heels PRN  assess skin qshift and PRN    Rehab Goals Patient on target to meet rehab goals: Yes *See Care Plan and progress notes for long and short-term goals.     Barriers to Discharge  Current Status/Progress Possible Resolutions Date Resolved   Physician    Medical stability;Nutrition means;Other (comments)     See above  Therapies, follow labs, bladder meds, optimize DM/HTN meds      Nursing                  PT  OT                  SLP                SW                Discharge Planning/Teaching Needs:  Plan to return home with sister and her family including a local neice who can provide 24/7 supervision.  Teaching needs to be determined closer to d/c.   Team Discussion:  Decreased urinary freq;  Medically stable overall. Still c/o back painn.  Left AFO trial successful - will order.  CG to min assist amb ~ 68' with rw.  Min/ mod assist with ADLs and still hoping for supervision goals.  ST completed with pt.  Revisions to Treatment Plan:  NA    Continued Need for Acute Rehabilitation Level of Care: The patient requires daily medical management by a physician with specialized  training in physical medicine and rehabilitation for the following conditions: Daily direction of a multidisciplinary physical rehabilitation program to ensure safe treatment while eliciting the highest outcome that is of practical value to the patient.: Yes Daily medical management of patient stability for increased activity during participation in an intensive rehabilitation regime.: Yes Daily analysis of laboratory values and/or radiology reports with any subsequent need for medication adjustment of medical intervention for : Pulmonary problems;Other;Urological problems   I attest that I was present, lead the team conference, and concur with the assessment and plan of the team.   Amada Jupiter 05/29/2018, 3:45 PM   Team conference was held via web/ teleconference due to COVID - 19

## 2018-05-29 NOTE — Progress Notes (Signed)
Occupational Therapy Session Note  Patient Details  Name: Richard Wang MRN: [id number] Date of Birth: 12/25/1945  Today's Date: 05/29/2018 OT Individual Time: 0935-1030 OT Individual Time Calculation (min): 55 min    Short Term Goals: Week 2:  OT Short Term Goal 1 (Week 2): Pt will tolerate standing for 5 mins in preparation for BADL tasks OT Short Term Goal 2 (Week 2): Pt will complete shower transfer with min A OT Short Term Goal 3 (Week 2): Pt will complete 2/3 toileting steps   Skilled Therapeutic Interventions/Progress Updates:    Treatment session with focus on LB dressing, sit > stand, and functional mobility.  Pt declined bathing/dressing as he completed shower during PM session yesterday.  Pt donned shoes with increased time due to back pain.  Engaged in discussion regarding home setup and recommendations for DME in bathroom, pt reports having shower seat and not concerned about shower transfers - plan to further assess and discuss as d/c approaches.  Pt requested to address BLE strength and mobility.  Engaged in marching in place as pt had not ambulated with shoes on during hospital stay.  Then progressed to ambulating with shoes with RW and min assist.  Pt ambulated 83' x2 with seated rest break in between.  Engaged in sit <> stand from recliner with focus on hand placement and weight shifting to attempt to increase independence.  Pt reports increased pain in back and BLE fatigue.  Returned to sitting in recliner with kpad at lower back for pain relief.  RN notified of pt current pain level.  Therapy Documentation Precautions:  Precautions Precautions: Fall Restrictions Weight Bearing Restrictions: No Pain: Pain Assessment Pain Scale: 0-10 Pain Score: 5 Pain Type: Chronic pain Pain Location: Back Pain Orientation: Lower;Right;Left Pain Descriptors / Indicators: Aching;Discomfort Pain Frequency: Constant Pain Onset: On-going Patients Stated Pain Goal: 0 Pain  Intervention(s): Repositioned;Ambulation/increased activity Multiple Pain Sites: No   Therapy/Group: Individual Therapy  Rosalio Loud 05/29/2018, 11:57 AM

## 2018-05-29 NOTE — Progress Notes (Signed)
Physical Therapy Session Note  Patient Details  Name: Richard Wang MRN: [id number] Date of Birth: 1945-08-19  Today's Date: 05/29/2018 PT Individual Time: 0800-0830 PT Individual Time Calculation (min): 30 min   Short Term Goals: Week 2:  PT Short Term Goal 1 (Week 2): Pt will perform sit<>stands w/ min assist  PT Short Term Goal 2 (Week 2): Pt will initiate stair training for funcitonal strengthening PT Short Term Goal 3 (Week 2): Pt will perform bed<>chair transfers w/ min assist 100% of the time PT Short Term Goal 4 (Week 2): Pt will maintain dynamic standing balance w/ min assist  Skilled Therapeutic Interventions/Progress Updates:    Patient received in bed, very pleasant and willing to work with PT today. Able to complete bed mobility with S and functional transfers with Min guard and RW. Able to pull pants up/adjust clothing with B UEs/no UE support on RW but did require MInA for balance due to posterior LOB. Gait training 48fx2 with RW and close S, gait distance limited by fatigue. [facility] to [facility] with RW and min guard and performed seated hip ABD, LAQs, and eccentric stand to sits from recliner. He was left up in the chair with chair pad alarm set, all needs otherwise met this morning.   Therapy Documentation Precautions:  Precautions Precautions: Fall Restrictions Weight Bearing Restrictions: No General:    Pain: Pain Assessment Pain Scale: 0-10 Pain Score: 4  Pain Type: Chronic pain Pain Location: Back Pain Orientation: Lower;Right;Left Pain Descriptors / Indicators: Aching;Discomfort Pain Frequency: Constant Pain Onset: On-going Patients Stated Pain Goal: 0 Pain Intervention(s): Repositioned;Ambulation/increased activity Multiple Pain Sites: No    Therapy/Group: Individual Therapy   KDeniece ReePT, DPT, CBIS  Supplemental Physical Therapist [facility]   Pager [phone] Rehab Office  [phone]   05/29/2018, 9:32 AM

## 2018-05-29 NOTE — Progress Notes (Signed)
Physical Therapy Session Note  Patient Details  Name: Richard Wang MRN: [id number] Date of Birth: 27-Dec-1945  Today's Date: 05/29/2018 PT Individual Time: 1115-1225 PT Individual Time Calculation (min): 70 min   Short Term Goals: Week 2:  PT Short Term Goal 1 (Week 2): Pt will perform sit<>stands w/ min assist  PT Short Term Goal 2 (Week 2): Pt will initiate stair training for funcitonal strengthening PT Short Term Goal 3 (Week 2): Pt will perform bed<>chair transfers w/ min assist 100% of the time PT Short Term Goal 4 (Week 2): Pt will maintain dynamic standing balance w/ min assist  Skilled Therapeutic Interventions/Progress Updates:    Patient received in recliner, pleasant and willing to participate in second session of PT but reporting increased back pain today. Able to complete sit to stand with MinA and RW, transfer to [facility] with min guard/RW. Transported patient to PT gym with totalA in [facility] and performed initial trial AFO fitting for L foot, then performed multiple bouts of gait approximately 40-30f with RW and min guard, cues for reduced steppage pattern and improved heel-toe mechanics, distance limited by pain. Suspect that patient may need a more supportive AFO due to ongoing difficulty with steppage pattern/mild toe drop in this model. Able to self-propel WC multiple distances of 1566ftoday with S. Transferred to Nustep with MinA for boost to full upright and safe use of RW during transfer, then able to tolerate 6 minutes of B UE/LE exercise on Nustep limited by low back pain today, also required [facility] multiple extended rest breaks due to pain today, provided education regarding AFO/possible upgrade, gait pattern, and activity in general during rest periods. He was left up in recliner with all needs met, heating pad in place, AFO removed, and chair alarm pad active this afternoon.   Therapy Documentation Precautions:  Precautions Precautions:  Fall Restrictions Weight Bearing Restrictions: No General:   Pain: Pain Assessment Pain Scale: 0-10 Pain Score: 6  Pain Type: Chronic pain Pain Location: Back Pain Orientation: Right Pain Descriptors / Indicators: Discomfort;Aching Pain Onset: On-going Patients Stated Pain Goal: 0 Pain Intervention(s): Heat applied;Repositioned Multiple Pain Sites: No    Therapy/Group: Individual Therapy   KrDeniece ReeT, DPT, CBIS  Supplemental Physical Therapist [facility]  Pager [phone] Rehab Office [phone]  05/29/2018, 3:48 PM

## 2018-05-30 ENCOUNTER — Inpatient Hospital Stay (HOSPITAL_COMMUNITY): Payer: Medicare Other | Admitting: Physical Therapy

## 2018-05-30 ENCOUNTER — Inpatient Hospital Stay (HOSPITAL_COMMUNITY): Payer: Medicare Other | Admitting: Occupational Therapy

## 2018-05-30 DIAGNOSIS — E876 Hypokalemia: Secondary | ICD-10-CM

## 2018-05-30 LAB — GLUCOSE, CAPILLARY
Glucose-Capillary: 86 mg/dL (ref 70–99)
Glucose-Capillary: 127 mg/dL — ABNORMAL HIGH (ref 70–99)
Glucose-Capillary: 190 mg/dL — ABNORMAL HIGH (ref 70–99)
Glucose-Capillary: 149 mg/dL — ABNORMAL HIGH (ref 70–99)

## 2018-05-30 LAB — BASIC METABOLIC PANEL
Anion gap: 10 (ref 5–15)
BUN: 32 mg/dL — ABNORMAL HIGH (ref 8–23)
CO2: 22 mmol/L (ref 22–32)
Calcium: 8.4 mg/dL — ABNORMAL LOW (ref 8.9–10.3)
Chloride: 111 mmol/L (ref 98–111)
Creatinine, Ser: 1.19 mg/dL (ref 0.61–1.24)
GFR calc Af Amer: >60 mL/min (ref >60)
GFR calc non Af Amer: >60 mL/min (ref >60)
Glucose, Bld: 97 mg/dL (ref 70–99)
Potassium: 3.1 mmol/L — ABNORMAL LOW (ref 3.5–5.1)
Sodium: 143 mmol/L (ref 135–145)

## 2018-05-30 MED ORDER — POTASSIUM CHLORIDE CRYS ER 20 MEQ PO TBCR
30.0000 meq | EXTENDED_RELEASE_TABLET | Freq: Every day | ORAL | Status: DC
  Administered 2018-05-30 – 2018-06-02 (×4): 30 meq via ORAL
  Filled 2018-05-30 (×4): qty 1

## 2018-05-30 NOTE — Progress Notes (Signed)
Occupational Therapy Weekly Progress Note  Patient Details  Name: Richard Wang MRN: [id number] Date of Birth: [birth year]  Beginning of progress report period: May 18, 2018 End of progress report period: May 30, 2018  Today's Date: 05/30/2018 OT Individual Time: 1415-1500 OT Individual Time Calculation (min): 45 min  and Today's Date: 05/30/2018 OT Missed Time: 30 Minutes Missed Time Reason: Patient fatigue   Patient has met 3 of 3 short term goals.  Pt is making steady progress with OT treatments at this time.  He is able to complete all transfers with use of the RW and CGA. He occasionally needs min A to stand, pending fatigue level. Pt is at an overall min/CGA level for BADL tasks, but still needs multiple rest breaks 2/2 activity tolerance. Feel he is on target to reach goals for discharge home next week.  Will continue with current OT treatment POC.    Patient continues to demonstrate the following deficits: muscle weakness, decreased cardiorespiratoy endurance and decreased standing balance and decreased balance strategies and therefore will continue to benefit from skilled OT intervention to enhance overall performance with BADL.  Patient progressing toward long term goals..  Continue plan of care.  OT Short Term Goals Week 2:  OT Short Term Goal 1 (Week 2): Pt will tolerate standing for 5 mins in preparation for BADL tasks OT Short Term Goal 1 - Progress (Week 2): Met OT Short Term Goal 2 (Week 2): Pt will complete shower transfer with min A OT Short Term Goal 2 - Progress (Week 2): Met OT Short Term Goal 3 (Week 2): Pt will complete 2/3 toileting steps  OT Short Term Goal 3 - Progress (Week 2): Met Week 3:  OT Short Term Goal 1 (Week 3): STG=LTG 2/2 ELOS  Skilled Therapeutic Interventions/Progress Updates:    Pt greeted semi-reclined in bed. Pt declined bathing/dressing today and stated he just went to the bathroom. Pt reports being very tired, but agreeable to try to  participate in OT. Sit<>stand from bed with min A, then pt ambulated 70 feet with RW and CGA. Pt very fatigued after ambulated and needed extended rest break to recover. OT educated pt on deep breathing techniques. Pt then completed 4 mins x3 on [employer] arm bike for UB strengthening/endurance. Pt needed 3 extended rest breaks in between sets. Pt reported he had reached max fatigue and requested to return to the room. Pt agreeable to try to ambulate back to room. Pt was able to make it back w/ RW and wc follow at [facility]. OT educated pt on how to doff AFO and pt demonstrated understanding. Pt returned to bed and left semi-reclined in bed with needs met.   Therapy Documentation Precautions:  Precautions Precautions: Fall Restrictions Weight Bearing Restrictions: No General: General OT Amount of Missed Time: 30 Minutes Pain: Pain Assessment Pain Scale: 0-10 Pain Score: 5  Pain Type: Chronic pain Pain Location: Back Pain Orientation: Lower Pain Descriptors / Indicators: Aching Pain Onset: On-going Pain Intervention(s): Repositioned;Heat applied   Therapy/Group: Individual Therapy  Valma Cava 05/30/2018, 3:25 PM

## 2018-05-30 NOTE — Progress Notes (Signed)
Physical Therapy Session Note  Patient Details  Name: LANDRIC GUMZ MRN: [id number] Date of Birth: 11/16/1945  Today's Date: 05/30/2018 PT Individual Time: 1000-1107 PT Individual Time Calculation (min): 67 min   Short Term Goals: Week 2:  PT Short Term Goal 1 (Week 2): Pt will perform sit<>stands w/ min assist  PT Short Term Goal 2 (Week 2): Pt will initiate stair training for funcitonal strengthening PT Short Term Goal 3 (Week 2): Pt will perform bed<>chair transfers w/ min assist 100% of the time PT Short Term Goal 4 (Week 2): Pt will maintain dynamic standing balance w/ min assist  Skilled Therapeutic Interventions/Progress Updates:   Pt in recliner and agreeable to therapy, pain as detailed below. Ambulated to/from therapy gym w/ supervision using RW and 1 rest break each bout, ambulated 75' at a time before needing seated rest break. Worked on standing tolerance and standing balance w/o UE support while performing bimanual tasks. Tasks included card matching, pipe tree, and vertical puzzle. Able to stand 3-4 min at a time before needing seated rest break. Needed occasional verbal cues for problem solving and self-correcting w/ pipe tree and puzzle. Negotiated 3" steps x8 steps w/ B rails to work on functional LE endurance, min assist, and performed x2 reps. Returned to room and ended session in supine, all needs in reach.  Therapy Documentation Precautions:  Precautions Precautions: Fall Restrictions Weight Bearing Restrictions: No Pain: Pain Assessment Pain Score: 5  Pain Type: Chronic pain Pain Location: Back Pain Orientation: Lower Pain Descriptors / Indicators: Aching Pain Onset: On-going Pain Intervention(s): Other (Comment)(stretching)  Therapy/Group: Individual Therapy  Kimetha Trulson K Jacorie Ernsberger 05/30/2018, 11:14 AM

## 2018-05-30 NOTE — Progress Notes (Signed)
Occupational Therapy Session Note  Patient Details  Name: Richard Wang MRN: [id number] Date of Birth: 1945-09-20  Today's Date: 05/31/2018 OT Individual Time: 830-930 OT Individual Time Calculation (min): 60 min    Short Term Goals: Week 2:  OT Short Term Goal 1 (Week 2): Pt will tolerate standing for 5 mins in preparation for BADL tasks OT Short Term Goal 2 (Week 2): Pt will complete shower transfer with min A OT Short Term Goal 3 (Week 2): Pt will complete 2/3 toileting steps   Skilled Therapeutic Interventions/Progress Updates:    Pt received in bed dressed and ready for the day. He declined a shower today. Pt stated he had already been up to use the bathroom and brush teeth. Pt agreeable to trying low back stretches that he could from bed level before getting up in the morning. Pt practiced bent knee rotations, hip flexion stretch with one knee pulled into chest at a time, and cross leg glute stretch.  Tried pelvic tilts but pt unable to achieve.   Sit to stand from elevated bed with B hands pushing up. Pt was able to do the first 3 with CGA and the next 3 with S.  Lowered bed slightly and pt able to continue sit to stand with CGA to RW.  His low back began to hurt too much so sat in recliner.   From recliner, pt worked on [employer] strengthening using Level 2 orange theraband.  Pt worked on scapular retraction with B reverse rows and B arm horizontal abduction.  Elbow flexion with 1 arm at a time as B arms aggravated his back.   Pt resting in recliner with kpad on, chair alarm on and all needs met.  Therapy Documentation Precautions:  Precautions Precautions: Fall Restrictions Weight Bearing Restrictions: No    Vital Signs: Therapy Vitals Temp: 98 F (36.7 C) Temp Source: Oral Pulse Rate: 78 Resp: 17 BP: 139/80 Patient Position (if appropriate): Lying Oxygen Therapy SpO2: 100 % O2 Device: Room Air Pain: Pain Assessment Pain Score: 5  Pain Type: Chronic pain Pain  Location: Back Pain Orientation: Lower Pain Descriptors / Indicators: Aching Pain Onset: On-going Pain Intervention(s): Other (Comment)(stretching)   Therapy/Group: Individual Therapy  [place] 05/30/2018, 8:49 AM

## 2018-05-30 NOTE — Progress Notes (Signed)
[facility] PHYSICAL MEDICINE & REHABILITATION PROGRESS NOTE   Subjective/Complaints: Patient seen laying in bed this morning he states he slept well overnight.  He states he wishes he was stronger.  ROS: Denies CP, SOB, N/V/D  Objective:   No results found. Recent Labs    05/27/18 0916  WBC 7.6  HGB 8.4*  HCT 28.7*  PLT 153   Recent Labs    05/30/18 0531  NA 143  K 3.1*  CL 111  CO2 22  GLUCOSE 97  BUN 32*  CREATININE 1.19  CALCIUM 8.4*    Intake/Output Summary (Last 24 hours) at 05/30/2018 0858 Last data filed at 05/30/2018 0500 Gross per 24 hour  Intake 460 ml  Output 1125 ml  Net -665 ml     Physical Exam: Vital Signs Blood pressure 139/80, pulse 78, temperature 98 F (36.7 C), temperature source Oral, resp. rate 17, height 5\' 10"  (1.778 m), weight 71.6 kg, SpO2 100 %. Constitutional: NAD.  Vital signs reviewed.  Well-developed. HENT: Normocephalic.  Atraumatic. Eyes: EOMI.  No discharge. Cardiovascular: No JVD. Respiratory: Normal effort. GI: Non-distended. Musc: No edema or tenderness in extremities. Neurologic: Alert and oriented, unchanged Motor: Grossly 4-4+/5 throughout, except for left ankle dorsiflexion 1/5, unchanged Skin: Warm and dry.  Intact.  Assessment/Plan: 1. Functional deficits secondary to debility with bilalteral foot drop which require 3+ hours per day of interdisciplinary therapy in a comprehensive inpatient rehab setting.  Physiatrist is providing close team supervision and 24 hour management of active medical problems listed below.  Physiatrist and rehab team continue to assess barriers to discharge/monitor patient progress toward functional and medical goals  Care Tool:  Bathing    Body parts bathed by patient: Right arm, Left arm, Chest, Abdomen, Front perineal area, Right upper leg, Left upper leg, Face   Body parts bathed by helper: Buttocks, Right lower leg, Left lower leg     Bathing assist Assist Level: Moderate  Assistance - Patient 50 - 74%     Upper Body Dressing/Undressing Upper body dressing   What is the patient wearing?: Pull over shirt    Upper body assist Assist Level: Minimal Assistance - Patient > 75%    Lower Body Dressing/Undressing Lower body dressing      What is the patient wearing?: Pants     Lower body assist Assist for lower body dressing: Moderate Assistance - Patient 50 - 74%     Toileting Toileting    Toileting assist Assist for toileting: Minimal Assistance - Patient > 75%     Transfers Chair/bed transfer  Transfers assist     Chair/bed transfer assist level: Minimal Assistance - Patient > 75%     Locomotion Ambulation   Ambulation assist   Ambulation activity did not occur: Safety/medical concerns  Assist level: Contact Guard/Touching assist Assistive device: Walker-rolling Max distance: 30'   Walk 10 feet activity   Assist  Walk 10 feet activity did not occur: Safety/medical concerns  Assist level: Contact Guard/Touching assist Assistive device: Walker-rolling   Walk 50 feet activity   Assist Walk 50 feet with 2 turns activity did not occur: Safety/medical concerns  Assist level: Contact Guard/Touching assist Assistive device: Walker-rolling    Walk 150 feet activity   Assist Walk 150 feet activity did not occur: Safety/medical concerns         Walk 10 feet on uneven surface  activity   Assist Walk 10 feet on uneven surfaces activity did not occur: Safety/medical concerns   Assist level:  Minimal Assistance - Patient > 75% Assistive device: Photographer Will patient use wheelchair at discharge?: (TBD) Type of Wheelchair: Manual    Wheelchair assist level: Supervision/Verbal cueing Max wheelchair distance: 150'    Wheelchair 50 feet with 2 turns activity    Assist        Assist Level: Supervision/Verbal cueing   Wheelchair 150 feet activity     Assist     Assist  Level: Supervision/Verbal cueing    Medical Problem List and Plan: 1.Debilitysecondary to acute on chronic respiratory failure.  Continue CIR  PRAFO LLE, likely will need AFO 2. Antithrombotics: -DVT/anticoagulation:Subcutaneous heparin -antiplatelet therapy: Aspirin 81 mg daily DC'd on 3/30 due to patient stating he was told to avoid aspirin and other anticoagulants due to GI bleed.  Pharmacy contacted-no results after extensive review, please see pharmacy note. 3. Pain Management:Tylenol as needed  Chronic low back pain- tylenol not helping ,added  Methocarbamol with benefit.  K pad ordered  Relatively controlled on 4/9 4. Mood:Provide emotional support -antipsychotic agents: N/A 5. Neuropsych: This patientiscapable of making decisions on hisown behalf. 6. Skin/Wound Care:routine skin checks 7. Fluids/Electrolytes/Nutrition:routineI's and O's   On D2 with honey liquids, advanced to D2 thins, advanced to D3 thins  Supplement with HS IVF 1/2ns 50cc/hr DC'd  8. Atrial fibrillation. Cardizem 30 mg twice a day, metoprolol 12.5 mg twice a day.   Confirmed with most recent ECG, reviewed 9. Hypertension. Hydralazine 20 mg every 8 hours  Norvasc 5 mg daily, increased to 7.5 on 4/7 Vitals:   05/29/18 1921 05/30/18 0459  BP: 137/87 139/80  Pulse: 94 78  Resp: 16 17  Temp: 98.3 F (36.8 C) 98 F (36.7 C)  SpO2: 99% 100%   Relatively controlled on 4/9 10.  Steroid-induced hyperglycemia   Lantus insulin 7 units daily.  CBG (last 3)  Recent Labs    05/29/18 1638 05/29/18 2130 05/30/18 0645  GLUCAP 169* 190* 86   CBGs labile on 4/9  Hemoglobin A1c 5.0 on 3/8  Monitor with steroid taper 11. Hyperlipidemia. Lipitor 12. Acute on chronic respiratory failure: Slow taper, decreased to 30 on 4/1, decreased again on 4/5, decreased again on 4/9 13.  Anemia normocytic  Hemoglobin 8.4 on 4/6  Continue to monitor 14.   Hypoalbuminemia  Supplement initiated on 3/30 15.  Leukocytosis-likely steroid-induced resolved  WBCs 7.6 on 4/6  Afebrile 16.  Urinary frequency, predominantly at night  Continue Flomax, increased on 4/1  PVRs suggesting retention  Bethanechol 10 nightly started on 3/31, increased to 3 times daily on 4/1  Improving 17.  Dry feet  Lotion ordered- improving 18.  Hypokalemia  Potassium 3.1 on 4/9  Supplemented x3 days  Continue to monitor  LOS: 13 days A FACE TO FACE EVALUATION WAS PERFORMED   Richard Wang 05/30/2018, 8:58 AM

## 2018-05-31 ENCOUNTER — Inpatient Hospital Stay (HOSPITAL_COMMUNITY): Payer: Medicare Other | Admitting: Occupational Therapy

## 2018-05-31 ENCOUNTER — Inpatient Hospital Stay (HOSPITAL_COMMUNITY): Payer: Medicare Other | Admitting: Physical Therapy

## 2018-05-31 LAB — GLUCOSE, CAPILLARY
Glucose-Capillary: 83 mg/dL (ref 70–99)
Glucose-Capillary: 116 mg/dL — ABNORMAL HIGH (ref 70–99)
Glucose-Capillary: 197 mg/dL — ABNORMAL HIGH (ref 70–99)
Glucose-Capillary: 126 mg/dL — ABNORMAL HIGH (ref 70–99)

## 2018-05-31 NOTE — Progress Notes (Signed)
Social Work Patient ID: Richard Wang, male   DOB: 19-Oct-1945, 73 y.o.   MRN: [id number]   Have reviewed team conference with pt and niece, Duwayne Heck.  Both pleased with progress and continue to plan for d/c next week.  Have scheduled for niece to be here Tuesday afternoon to complete family education.  Will alert team.  Amada Jupiter, LCSW

## 2018-05-31 NOTE — Progress Notes (Signed)
[facility] PHYSICAL MEDICINE & REHABILITATION PROGRESS NOTE   Subjective/Complaints: Patient seen laying in bed this morning.  He states he slept fairly overnight.  He denies complaints.  He notes improvement in ambulation with AFO.  ROS: Denies CP, SOB (some at baseline), N/V/D  Objective:   No results found. No results for input(s): WBC, HGB, HCT, PLT in the last 72 hours. Recent Labs    05/30/18 0531  NA 143  K 3.1*  CL 111  CO2 22  GLUCOSE 97  BUN 32*  CREATININE 1.19  CALCIUM 8.4*    Intake/Output Summary (Last 24 hours) at 05/31/2018 0909 Last data filed at 05/31/2018 0733 Gross per 24 hour  Intake 480 ml  Output 550 ml  Net -70 ml     Physical Exam: Vital Signs Blood pressure 137/78, pulse 74, temperature (!) 97.4 F (36.3 C), temperature source Oral, resp. rate 17, height 5\' 10"  (1.778 m), weight 71.6 kg, SpO2 97 %. Constitutional: NAD.  Vital signs reviewed.  Well-developed. HENT: Normocephalic.  Atraumatic. Eyes: EOMI.  No discharge. Cardiovascular: No JVD. Respiratory: Normal effort. GI: Non-distended. Musc: No edema or tenderness in extremities. Neurologic: Alert and oriented Motor: Grossly 4-4+/5 throughout, except for left ankle dorsiflexion 1/5, stable Skin: Warm and dry.  Intact.  Assessment/Plan: 1. Functional deficits secondary to debility with bilalteral foot drop which require 3+ hours per day of interdisciplinary therapy in a comprehensive inpatient rehab setting.  Physiatrist is providing close team supervision and 24 hour management of active medical problems listed below.  Physiatrist and rehab team continue to assess barriers to discharge/monitor patient progress toward functional and medical goals  Care Tool:  Bathing    Body parts bathed by patient: Right arm, Left arm, Chest, Abdomen, Front perineal area, Right upper leg, Left upper leg, Face   Body parts bathed by helper: Buttocks, Right lower leg, Left lower leg     Bathing  assist Assist Level: Moderate Assistance - Patient 50 - 74%     Upper Body Dressing/Undressing Upper body dressing   What is the patient wearing?: Pull over shirt    Upper body assist Assist Level: Minimal Assistance - Patient > 75%    Lower Body Dressing/Undressing Lower body dressing      What is the patient wearing?: Pants     Lower body assist Assist for lower body dressing: Moderate Assistance - Patient 50 - 74%     Toileting Toileting    Toileting assist Assist for toileting: Minimal Assistance - Patient > 75%     Transfers Chair/bed transfer  Transfers assist     Chair/bed transfer assist level: Minimal Assistance - Patient > 75%     Locomotion Ambulation   Ambulation assist   Ambulation activity did not occur: Safety/medical concerns  Assist level: Supervision/Verbal cueing Assistive device: Walker-rolling Max distance: 75'   Walk 10 feet activity   Assist  Walk 10 feet activity did not occur: Safety/medical concerns  Assist level: Supervision/Verbal cueing Assistive device: Walker-rolling   Walk 50 feet activity   Assist Walk 50 feet with 2 turns activity did not occur: Safety/medical concerns  Assist level: Supervision/Verbal cueing Assistive device: Walker-rolling    Walk 150 feet activity   Assist Walk 150 feet activity did not occur: Safety/medical concerns         Walk 10 feet on uneven surface  activity   Assist Walk 10 feet on uneven surfaces activity did not occur: Safety/medical concerns   Assist level: Minimal Assistance -  Patient > 75% Assistive device: Photographer Will patient use wheelchair at discharge?: (TBD) Type of Wheelchair: Manual    Wheelchair assist level: Supervision/Verbal cueing Max wheelchair distance: 150'    Wheelchair 50 feet with 2 turns activity    Assist        Assist Level: Supervision/Verbal cueing   Wheelchair 150 feet activity      Assist     Assist Level: Supervision/Verbal cueing    Medical Problem List and Plan: 1.Debilitysecondary to acute on chronic respiratory failure.  Continue CIR  PRAFO LLE, likely will need AFO 2. Antithrombotics: -DVT/anticoagulation:Subcutaneous heparin -antiplatelet therapy: Aspirin 81 mg daily DC'd on 3/30 due to patient stating he was told to avoid aspirin and other anticoagulants due to GI bleed.  Pharmacy contacted-no results after extensive review, please see pharmacy note. 3. Pain Management:Tylenol as needed  Chronic low back pain- tylenol not helping ,added  Methocarbamol with benefit.  K pad ordered  Relatively controlled on 4/10 4. Mood:Provide emotional support -antipsychotic agents: N/A 5. Neuropsych: This patientiscapable of making decisions on hisown behalf. 6. Skin/Wound Care:routine skin checks 7. Fluids/Electrolytes/Nutrition:routineI's and O's   On D2 with honey liquids, advanced to D2 thins, advanced to D3 thins  Supplement with HS IVF 1/2ns 50cc/hr DC'd  8. Atrial fibrillation. Cardizem 30 mg twice a day, metoprolol 12.5 mg twice a day.   Confirmed with most recent ECG, reviewed 9. Hypertension. Hydralazine 20 mg every 8 hours  Norvasc 5 mg daily, increased to 7.5 on 4/7 Vitals:   05/30/18 2016 05/31/18 0351  BP: 138/80 137/78  Pulse: 89 74  Resp: 19 17  Temp: 97.7 F (36.5 C) (!) 97.4 F (36.3 C)  SpO2: 100% 97%   Controlled on 4/10 10.  Steroid-induced hyperglycemia   Lantus insulin 7 units daily.  CBG (last 3)  Recent Labs    05/30/18 1644 05/30/18 2203 05/31/18 0616  GLUCAP 190* 149* 83   CBGs labile on 4/10  Hemoglobin A1c 5.0 on 3/8  Monitor with steroid taper 11. Hyperlipidemia. Lipitor 12. Acute on chronic respiratory failure: Slow taper, decreased to 30 on 4/1, decreased again on 4/5, decreased again on 4/9 13.  Anemia normocytic  Hemoglobin 8.4 on 4/6  Labs ordered for  Monday  Continue to monitor 14.  Hypoalbuminemia  Supplement initiated on 3/30 15.  Leukocytosis-likely steroid-induced resolved  WBCs 7.6 on 4/6  Labs ordered for Monday  Afebrile 16.  Urinary frequency, predominantly at night  Continue Flomax, increased on 4/1  PVRs suggesting retention  Bethanechol 10 nightly started on 3/31, increased to 3 times daily on 4/1  Improving 17.  Dry feet  Lotion ordered- improving 18.  Hypokalemia  Potassium 3.1 on 4/9, labs ordered for Monday  Supplemented x3 days  Continue to monitor  LOS: 14 days A FACE TO FACE EVALUATION WAS PERFORMED  Richard Wang 05/31/2018, 9:09 AM

## 2018-05-31 NOTE — Progress Notes (Signed)
Physical Therapy Weekly Progress Note  Patient Details  Name: Richard Wang MRN: [id number] Date of Birth: 1945-04-12  Beginning of progress report period: May 24, 2018 End of progress report period: May 31, 2018  Today's Date: 05/31/2018 PT Individual Time: 0925-1027 PT Individual Time Calculation (min): 62 min   Patient has met 3 of 4 short term goals. Pt continues to make steady progress towards LTGs. He is performing all mobility w/ CGA, however continues to require min-mod assist sit<>stands depending on level of fatigue and height of surface. Once in standing, he moves w/ CGA-close supervision using RW. He continues to require heavy reliance on UE support for all standing mobility 2/2 LE weakness and chronic low back pain. Now using LAFO to address L foot drop w/ improved gait pattern and increased safety w/ gait.   Patient continues to demonstrate the following deficits muscle weakness, muscle joint tightness and muscle paralysis, decreased cardiorespiratoy endurance, decreased coordination and decreased standing balance and decreased balance strategies and therefore will continue to benefit from skilled PT intervention to increase functional independence with mobility.  Patient progressing toward long term goals..  Continue plan of care.  PT Short Term Goals Week 2:  PT Short Term Goal 1 (Week 2): Pt will perform sit<>stands w/ min assist  PT Short Term Goal 1 - Progress (Week 2): Met PT Short Term Goal 2 (Week 2): Pt will initiate stair training for funcitonal strengthening PT Short Term Goal 2 - Progress (Week 2): Met PT Short Term Goal 3 (Week 2): Pt will perform bed<>chair transfers w/ min assist 100% of the time PT Short Term Goal 3 - Progress (Week 2): Partly met PT Short Term Goal 4 (Week 2): Pt will maintain dynamic standing balance w/ min assist PT Short Term Goal 4 - Progress (Week 2): Met Week 3:  PT Short Term Goal 1 (Week 3): =LTGs due to ELOS  Skilled Therapeutic  Interventions/Progress Updates:   Pt in supine and agreeable to therapy, pain as detailed below in lower back. Supervision bed mobility and min assist squat pivot to w/c. Pt self-propelled w/c to/from therapy gym w/ supervision using BUEs to work on functional endurance. NuStep 8 min @ level 4 for global strengthening and endurance training. Ambulated 150' and 100' w/ close supervision using RW to work on endurance. Worked on gastroc stretching while performing standing activities on red foam wedge. Performed horse shoe toss while in standing w/ CGA-min assist for balance on foam, 30-60 sec at a time. Limited in duration 2/2 increase in back pain, although states it's still 4/10 pain. Blocked practice of sit<>stands from elevated mat surface w/o UE assist to work on functional LE strengthening. 3x4 reps w/ min assist fading to CGA w/ repetition. Tactile, visual, and verbal cues for technique to utilize more of LEs to boost up instead of compensating w/ back musculature. Returned to room and ended session in supine, all needs in reach. Pt w/ increased c/o dizziness at end of session, BP WNL. Missed 13 min of skilled PT 2/2 fatigue/dizziness - made RN aware.   Therapy Documentation Precautions:  Precautions Precautions: Fall Restrictions Weight Bearing Restrictions: No Vital Signs: Therapy Vitals BP: 133/86 Patient Position (if appropriate): Sitting Pain:    Therapy/Group: Individual Therapy  Doneisha Ivey K Blayn Whetsell 05/31/2018, 10:29 AM

## 2018-05-31 NOTE — Progress Notes (Signed)
Occupational Therapy Session Note  Patient Details  Name: Richard Wang MRN: [id number] Date of Birth: 01-03-1946  Today's Date: 05/31/2018 OT Individual Time: 1415-1520 OT Individual Time Calculation (min): 65 min   Short Term Goals: Week 3:  OT Short Term Goal 1 (Week 3): STG=LTG 2/2 ELOS  Skilled Therapeutic Interventions/Progress Updates:    P t greeted semi-reclined in bed, agreeable to OT with encouragement. Nursing entered to administer pain medication 2/2 low back pain. Pt declined to shower today, but request to wash hair using shower cap. Pt needed min A to come to standing with RW, then ambulated to the sink with CGA. Pt tolerated standing for 2 mins while washing hair in shower cap. Pt took extended rest break, then ambulated to therapy gym with RW and CGA. Blocked practice for donning/doffing L AFO. Pt eventually able to complete with min verbal cues but no physical assist. Provided pt with level 2 theraband and completed 3 sets of 10 tricep press, bicep curl, and diagonal pull. Pt reported max fatigue and ambulated back to room in similar fashion as above. Pt left semi-reclined in bed with bed alarm on and needs met.   Therapy Documentation Precautions:  Precautions Precautions: Fall Restrictions Weight Bearing Restrictions: No Pain: Pain Assessment Pain Scale: 0-10 Pain Score: 4  Pain Type: Chronic pain Pain Location: Back Pain Orientation: Lower Pain Descriptors / Indicators: Aching Pain Onset: On-going Pain Intervention(s): Repositioned;Heat applied   Therapy/Group: Individual Therapy  Valma Cava 05/31/2018, 3:40 PM

## 2018-05-31 NOTE — Progress Notes (Signed)
Occupational Therapy Session Note  Patient Details  Name: Richard Wang MRN: [id number] Date of Birth: 12-22-1945  Today's Date: 05/31/2018 OT Individual Time: 1100-1200 OT Individual Time Calculation (min): 60 min    Short Term Goals: Week 3:  OT Short Term Goal 1 (Week 3): STG=LTG 2/2 ELOS  Skilled Therapeutic Interventions/Progress Updates:    Pt received in bed and agreeable to exercises to improve activity tolerance. From bed level with HOB lowered pt worked on 10-12 reps with 4 lb dowel bar of chest press, overhead reach, and abdominal crunches followed by hip bridge/ glute squeeze for  3 set with a 3-4 min rest break between each set.  Pt then sat to EOB and worked on sit to stand with RW and CGA but C/o back pain, tried standing and marching in place or forward toe taps but his back was hurting too much to continue.  From EOB, upper back strengthening with reverse rows pulling on 4 lb dowel with resistance from Level 2 orange theraband 12 x alternating with 20 bicep curls.    Pt expressed confusion about not fully understanding "what happened to him" to bring him to the hospital.  Reviewed the H&P with the patient and what specifically rehab is helping him with related to his CHF and rehab after time on a ventilator. Pt expressed he understood now.    Pt opted to lay down in bed with kpad versus the recliner due to back pain. Pt in bed with alarm set and all needs met.   Therapy Documentation Precautions:  Precautions Precautions: Fall Restrictions Weight Bearing Restrictions: No  Vital Signs: Therapy Vitals BP: 133/86 Patient Position (if appropriate): Sitting Pain: Pain Assessment Pain Scale: 0-10 Pain Score: 4  Pain Type: Acute pain Pain Location: Abdomen Pain Orientation: Right Pain Intervention(s): RN made aware;Refused  Therapy/Group: Individual Therapy  [place] 05/31/2018, 11:21 AM

## 2018-06-01 ENCOUNTER — Inpatient Hospital Stay (HOSPITAL_COMMUNITY): Payer: Medicare Other | Admitting: Physical Therapy

## 2018-06-01 LAB — CBC
HCT: 27.5 % — ABNORMAL LOW (ref 39.0–52.0)
Hemoglobin: 8.1 g/dL — ABNORMAL LOW (ref 13.0–17.0)
MCH: 26.6 pg (ref 26.0–34.0)
MCHC: 29.5 g/dL — ABNORMAL LOW (ref 30.0–36.0)
MCV: 90.2 fL (ref 80.0–100.0)
Platelets: 156 10*3/uL (ref 150–400)
RBC: 3.05 MIL/uL — ABNORMAL LOW (ref 4.22–5.81)
RDW: 16.9 % — ABNORMAL HIGH (ref 11.5–15.5)
WBC: 9.1 10*3/uL (ref 4.0–10.5)
nRBC: 0 % (ref 0.0–0.2)

## 2018-06-01 LAB — GLUCOSE, CAPILLARY
Glucose-Capillary: 88 mg/dL (ref 70–99)
Glucose-Capillary: 186 mg/dL — ABNORMAL HIGH (ref 70–99)
Glucose-Capillary: 146 mg/dL — ABNORMAL HIGH (ref 70–99)
Glucose-Capillary: 145 mg/dL — ABNORMAL HIGH (ref 70–99)

## 2018-06-01 LAB — BASIC METABOLIC PANEL
Anion gap: 9 (ref 5–15)
BUN: 28 mg/dL — ABNORMAL HIGH (ref 8–23)
CO2: 18 mmol/L — ABNORMAL LOW (ref 22–32)
Calcium: 8.3 mg/dL — ABNORMAL LOW (ref 8.9–10.3)
Chloride: 114 mmol/L — ABNORMAL HIGH (ref 98–111)
Creatinine, Ser: 1.07 mg/dL (ref 0.61–1.24)
GFR calc Af Amer: >60 mL/min (ref >60)
GFR calc non Af Amer: >60 mL/min (ref >60)
Glucose, Bld: 207 mg/dL — ABNORMAL HIGH (ref 70–99)
Potassium: 3.6 mmol/L (ref 3.5–5.1)
Sodium: 141 mmol/L (ref 135–145)

## 2018-06-01 LAB — CK TOTAL AND CKMB (NOT AT ARMC)
CK, MB: 1.3 ng/mL (ref 0.5–5.0)
Relative Index: INVALID (ref 0.0–2.5)
Total CK: 20 U/L — ABNORMAL LOW (ref 49–397)

## 2018-06-01 LAB — MAGNESIUM: Magnesium: 0.9 mg/dL — CL (ref 1.7–2.4)

## 2018-06-01 LAB — TROPONIN I
Troponin I: 0.03 ng/mL (ref <0.03)
Troponin I: 0.03 ng/mL (ref <0.03)

## 2018-06-01 MED ORDER — LORATADINE 10 MG PO TABS
10.0000 mg | ORAL_TABLET | Freq: Every day | ORAL | Status: DC
  Filled 2018-06-01: qty 1

## 2018-06-01 MED ORDER — DILTIAZEM HCL 60 MG PO TABS
30.0000 mg | ORAL_TABLET | Freq: Once | ORAL | Status: AC
  Administered 2018-06-01: 30 mg via ORAL
  Filled 2018-06-01: qty 1

## 2018-06-01 MED ORDER — MAGNESIUM OXIDE 400 (241.3 MG) MG PO TABS
400.0000 mg | ORAL_TABLET | Freq: Four times a day (QID) | ORAL | Status: DC
  Administered 2018-06-01 – 2018-06-02 (×4): 400 mg via ORAL
  Filled 2018-06-01 (×4): qty 1

## 2018-06-01 MED ORDER — TRIAMCINOLONE ACETONIDE 0.1 % EX OINT
TOPICAL_OINTMENT | Freq: Four times a day (QID) | CUTANEOUS | Status: DC | PRN
  Filled 2018-06-01: qty 15

## 2018-06-01 NOTE — Progress Notes (Addendum)
EKG results called to MD Plotnikov. New blood work ordered. Patient still reporting same symptoms. Patient reports some SOB that comes and goes. O2 sats at 100% on RA. Continue to monitor.

## 2018-06-01 NOTE — Progress Notes (Addendum)
Richard Wang is a 73 y.o. male 22-Jul-1945 [id number]  Subjective: Complains of itching in the nose. No new problems. Slept well. Feeling OK.  Objective: Vital signs in last 24 hours: Temp:  [97.9 F (36.6 C)-98.2 F (36.8 C)] 98.2 F (36.8 C) (04/11 0606) Pulse Rate:  [84-102] 93 (04/11 0606) Resp:  [20] 20 (04/11 0606) BP: (120-150)/(80-83) 150/81 (04/11 0606) SpO2:  [100 %] 100 % (04/11 0606) Weight change:  Last BM Date: 05/31/18  Intake/Output from previous day: 04/10 0701 - 04/11 0700 In: 340 [P.O.:340] Out: 225 [Urine:225] Last cbgs: CBG (last 3)  Recent Labs    05/31/18 2121 06/01/18 0610 06/01/18 1151  GLUCAP 126* 88 186*     Physical Exam General: No apparent distress   HEENT: not dry.  Nose exam is normal Lungs: Normal effort. Lungs clear to auscultation, no crackles or wheezes. Cardiovascular: irregular rate and rhythm, no edema Abdomen: S/NT/ND; BS(+) Musculoskeletal:  unchanged Neurological: No new neurological deficits Wounds: N/A    Skin: clear  Aging changes Mental state: Alert, oriented, cooperative    Lab Results: BMET    Component Value Date/Time   NA 143 05/30/2018 0531   K 3.1 (L) 05/30/2018 0531   CL 111 05/30/2018 0531   CO2 22 05/30/2018 0531   GLUCOSE 97 05/30/2018 0531   BUN 32 (H) 05/30/2018 0531   CREATININE 1.19 05/30/2018 0531   CALCIUM 8.4 (L) 05/30/2018 0531   GFRNONAA >60 05/30/2018 0531   GFRAA >60 05/30/2018 0531   CBC    Component Value Date/Time   WBC 7.6 05/27/2018 0916   RBC 3.18 (L) 05/27/2018 0916   HGB 8.4 (L) 05/27/2018 0916   HCT 28.7 (L) 05/27/2018 0916   PLT 153 05/27/2018 0916   MCV 90.3 05/27/2018 0916   MCH 26.4 05/27/2018 0916   MCHC 29.3 (L) 05/27/2018 0916   RDW 16.5 (H) 05/27/2018 0916   LYMPHSABS 1.2 05/27/2018 0916   MONOABS 0.4 05/27/2018 0916   EOSABS 0.0 05/27/2018 0916   BASOSABS 0.0 05/27/2018 0916    Studies/Results: No results found.  Medications: I have reviewed the  patient's current medications.  Assessment/Plan:  1.  Debility secondary to acute on chronic respiratory failure.  Continue CIR 2.  DVT prophylaxis with subcutaneous heparin.  Aspirin was discontinued 3.  Pain management with Tylenol PRN.  Methocarbamol PRN.  K pad as needed 4.  Emotional support 5.  Routine skin care 6.  Dysphagia.  On D2 honey liquids 7.  Atrial fibrillation.  On Cardizem and metoprolol 8.  Hypertension.  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  Dyslipidemia.  On Lipitor 11.  Respiratory failure, acute on chronic.  Slow steroid taper 12.  Hypoalbuminemia.  On supplements 13.  Chronic anemia.  Monitor CBC 14.  Urinary frequency.  Continue with Flomax.  PVRs suggesting retention.  Bethanechol 3 times a day 15.  Hypokalemia.  On supplements 16.  Allergic rhinitis.  Claritin as needed triamcinolone ointment if needed to skin   Length of stay, days: 15  Sonda Primes , MD 06/01/2018, 12:11 PM

## 2018-06-01 NOTE — Plan of Care (Signed)
  Problem: Consults Goal: RH GENERAL PATIENT EDUCATION Description See Patient Education module for education specifics. Outcome: Progressing Goal: Diabetes Guidelines if Diabetic/Glucose > 140 Description If diabetic or lab glucose is > 140 mg/dl - Initiate Diabetes/Hyperglycemia Guidelines & Document Interventions  Outcome: Progressing   Problem: RH BLADDER ELIMINATION Goal: RH STG MANAGE BLADDER WITH ASSISTANCE Description STG Manage Bladder With min Assistance  Outcome: Progressing   Problem: RH SKIN INTEGRITY Goal: RH STG SKIN FREE OF INFECTION/BREAKDOWN Description Patients skin will remain free from further infection or breakdown with mod I assist.     Outcome: Progressing Goal: RH STG MAINTAIN SKIN INTEGRITY WITH ASSISTANCE Description STG Maintain Skin Integrity With mod I Assistance.  Outcome: Progressing Goal: RH STG ABLE TO PERFORM INCISION/WOUND CARE W/ASSISTANCE Description STG Able To Perform Incision/Wound Care With mod I Assistance.  Outcome: Progressing   Problem: RH SAFETY Goal: RH STG ADHERE TO SAFETY PRECAUTIONS W/ASSISTANCE/DEVICE Description STG Adhere to Safety Precautions With mod I Assistance/Device.  Outcome: Progressing   Problem: RH BOWEL ELIMINATION Goal: RH STG MANAGE BOWEL WITH ASSISTANCE Description STG Manage Bowel with min Assistance.  Outcome: Progressing   

## 2018-06-01 NOTE — Progress Notes (Signed)
Physical Therapy Session Note  Patient Details  Name: Richard Wang MRN: [id number] Date of Birth: Dec 18, 1945  Today's Date: 06/01/2018 PT Individual Time: 1300-1339 PT Individual Time Calculation (min): 39 min   Short Term Goals: Week 3:  PT Short Term Goal 1 (Week 3): =LTGs due to ELOS  Skilled Therapeutic Interventions/Progress Updates:   Pt received supine in bed and agreeable to PT. Supine>sit transfer with supervision assist and min cues for safety. PT assisted to to don shoes while sitting EOB. Stand pivot transfer to [facility] min assist for safety. This pt has history of chronic Afib but per pt report has been rate controlled for > 1 year. He states that he feeling like heart is racing once in [place]. PT assessed HR 89 with irregular rhythm. Pt propelled WC into hall. RN then reports that EKG is coming to assess pt shortly, and Lab work has been ordered. Stand pivot transfer to bed with CGA. Sit>supine without cues or assist from PT. Once in bed pt reports mild tightness in chest. EKG performed and found pt to be in Afib. PT assessed Pt vital signs 134/70, 100bpm, 99%. PT decided to deferred additional therapies at this time following confirmation of Afib with chest tightness and pending blood work. Pt left in bed with call bell in reach and all needs met.       Therapy Documentation Precautions:  Precautions Precautions: Fall Restrictions Weight Bearing Restrictions: No Vital Signs: Therapy Vitals Pulse Rate: 98 Resp: 18 BP: 119/87 Patient Position (if appropriate): Sitting Oxygen Therapy SpO2: 100 % O2 Device: Room Air Pain:  1/10 chest tightness. RN aware.    Therapy/Group: Individual Therapy  Lorie Phenix 06/01/2018, 1:51 PM

## 2018-06-01 NOTE — Progress Notes (Signed)
CRITICAL VALUE ALERT  Critical Value:  Magnesium 0.9 and troponin 0.03  Date & Time Notied:  06/01/18 1530  Provider Notified: MD Plotnikov  Orders Received/Actions taken: Orders for troponin q6h x 3, cardizem 30mg  once, magnesium 400mg  QID, and BMP/Magnesium draw in AM.

## 2018-06-01 NOTE — Progress Notes (Signed)
Critical values for troponin and magnesium received and called to Plotnikov MD. New orders received. Continue to monitor.

## 2018-06-01 NOTE — Progress Notes (Signed)
Patient reports feeling like his heart is "beating funny". Patient has history of afibb and reports has felt this way in the past. VItals taken. MD Plotnikov notified. New orders received. Patient denies any chest pain. Continue to monitor

## 2018-06-02 ENCOUNTER — Inpatient Hospital Stay (HOSPITAL_COMMUNITY)
Admission: AD | Admit: 2018-06-02 | Discharge: 2018-06-06 | DRG: 308 | Disposition: A | Discharge status: Rehabilitation Facility | Payer: Medicare Other | Source: Ambulatory Visit | Attending: Internal Medicine | Admitting: Internal Medicine

## 2018-06-02 ENCOUNTER — Encounter (HOSPITAL_COMMUNITY): Payer: Self-pay | Admitting: Internal Medicine

## 2018-06-02 ENCOUNTER — Inpatient Hospital Stay (HOSPITAL_COMMUNITY): Payer: Medicare Other

## 2018-06-02 DIAGNOSIS — J9622 Acute and chronic respiratory failure with hypercapnia: Secondary | ICD-10-CM | POA: Diagnosis present

## 2018-06-02 DIAGNOSIS — R339 Retention of urine, unspecified: Secondary | ICD-10-CM | POA: Diagnosis not present

## 2018-06-02 DIAGNOSIS — I110 Hypertensive heart disease with heart failure: Secondary | ICD-10-CM | POA: Diagnosis not present

## 2018-06-02 DIAGNOSIS — R0603 Acute respiratory distress: Secondary | ICD-10-CM

## 2018-06-02 DIAGNOSIS — J441 Chronic obstructive pulmonary disease with (acute) exacerbation: Secondary | ICD-10-CM | POA: Diagnosis present

## 2018-06-02 DIAGNOSIS — M545 Low back pain: Secondary | ICD-10-CM | POA: Diagnosis present

## 2018-06-02 DIAGNOSIS — J398 Other specified diseases of upper respiratory tract: Secondary | ICD-10-CM | POA: Diagnosis present

## 2018-06-02 DIAGNOSIS — N182 Chronic kidney disease, stage 2 (mild): Secondary | ICD-10-CM | POA: Diagnosis present

## 2018-06-02 DIAGNOSIS — R0602 Shortness of breath: Secondary | ICD-10-CM

## 2018-06-02 DIAGNOSIS — D500 Iron deficiency anemia secondary to blood loss (chronic): Secondary | ICD-10-CM | POA: Diagnosis present

## 2018-06-02 DIAGNOSIS — N179 Acute kidney failure, unspecified: Secondary | ICD-10-CM | POA: Diagnosis not present

## 2018-06-02 DIAGNOSIS — D72829 Elevated white blood cell count, unspecified: Secondary | ICD-10-CM | POA: Diagnosis not present

## 2018-06-02 DIAGNOSIS — K5010 Crohn's disease of large intestine without complications: Secondary | ICD-10-CM | POA: Diagnosis not present

## 2018-06-02 DIAGNOSIS — K50111 Crohn's disease of large intestine with rectal bleeding: Secondary | ICD-10-CM | POA: Diagnosis present

## 2018-06-02 DIAGNOSIS — E1165 Type 2 diabetes mellitus with hyperglycemia: Secondary | ICD-10-CM | POA: Diagnosis present

## 2018-06-02 DIAGNOSIS — I5021 Acute systolic (congestive) heart failure: Secondary | ICD-10-CM | POA: Diagnosis present

## 2018-06-02 DIAGNOSIS — E875 Hyperkalemia: Secondary | ICD-10-CM | POA: Diagnosis not present

## 2018-06-02 DIAGNOSIS — R1310 Dysphagia, unspecified: Secondary | ICD-10-CM | POA: Diagnosis present

## 2018-06-02 DIAGNOSIS — I5023 Acute on chronic systolic (congestive) heart failure: Secondary | ICD-10-CM | POA: Diagnosis present

## 2018-06-02 DIAGNOSIS — Z9981 Dependence on supplemental oxygen: Secondary | ICD-10-CM | POA: Diagnosis not present

## 2018-06-02 DIAGNOSIS — D638 Anemia in other chronic diseases classified elsewhere: Secondary | ICD-10-CM | POA: Diagnosis present

## 2018-06-02 DIAGNOSIS — I5043 Acute on chronic combined systolic (congestive) and diastolic (congestive) heart failure: Secondary | ICD-10-CM | POA: Diagnosis not present

## 2018-06-02 DIAGNOSIS — E119 Type 2 diabetes mellitus without complications: Secondary | ICD-10-CM | POA: Diagnosis not present

## 2018-06-02 DIAGNOSIS — I482 Chronic atrial fibrillation, unspecified: Secondary | ICD-10-CM | POA: Diagnosis present

## 2018-06-02 DIAGNOSIS — R5381 Other malaise: Secondary | ICD-10-CM | POA: Diagnosis present

## 2018-06-02 DIAGNOSIS — I4891 Unspecified atrial fibrillation: Secondary | ICD-10-CM | POA: Diagnosis present

## 2018-06-02 DIAGNOSIS — J449 Chronic obstructive pulmonary disease, unspecified: Secondary | ICD-10-CM

## 2018-06-02 DIAGNOSIS — D649 Anemia, unspecified: Secondary | ICD-10-CM | POA: Diagnosis not present

## 2018-06-02 DIAGNOSIS — T380X5A Adverse effect of glucocorticoids and synthetic analogues, initial encounter: Secondary | ICD-10-CM | POA: Diagnosis present

## 2018-06-02 DIAGNOSIS — Z7984 Long term (current) use of oral hypoglycemic drugs: Secondary | ICD-10-CM

## 2018-06-02 DIAGNOSIS — R350 Frequency of micturition: Secondary | ICD-10-CM | POA: Diagnosis present

## 2018-06-02 DIAGNOSIS — R64 Cachexia: Secondary | ICD-10-CM | POA: Diagnosis present

## 2018-06-02 DIAGNOSIS — R740 Nonspecific elevation of levels of transaminase and lactic acid dehydrogenase [LDH]: Secondary | ICD-10-CM | POA: Diagnosis not present

## 2018-06-02 DIAGNOSIS — J9621 Acute and chronic respiratory failure with hypoxia: Secondary | ICD-10-CM | POA: Diagnosis present

## 2018-06-02 DIAGNOSIS — M21372 Foot drop, left foot: Secondary | ICD-10-CM | POA: Diagnosis present

## 2018-06-02 DIAGNOSIS — R627 Adult failure to thrive: Secondary | ICD-10-CM | POA: Diagnosis present

## 2018-06-02 DIAGNOSIS — Z808 Family history of malignant neoplasm of other organs or systems: Secondary | ICD-10-CM

## 2018-06-02 DIAGNOSIS — B961 Klebsiella pneumoniae [K. pneumoniae] as the cause of diseases classified elsewhere: Secondary | ICD-10-CM | POA: Diagnosis not present

## 2018-06-02 DIAGNOSIS — E1122 Type 2 diabetes mellitus with diabetic chronic kidney disease: Secondary | ICD-10-CM | POA: Diagnosis present

## 2018-06-02 DIAGNOSIS — K50919 Crohn's disease, unspecified, with unspecified complications: Secondary | ICD-10-CM | POA: Diagnosis not present

## 2018-06-02 DIAGNOSIS — I2510 Atherosclerotic heart disease of native coronary artery without angina pectoris: Secondary | ICD-10-CM | POA: Diagnosis present

## 2018-06-02 DIAGNOSIS — N390 Urinary tract infection, site not specified: Secondary | ICD-10-CM | POA: Diagnosis not present

## 2018-06-02 DIAGNOSIS — Z79891 Long term (current) use of opiate analgesic: Secondary | ICD-10-CM

## 2018-06-02 DIAGNOSIS — I130 Hypertensive heart and chronic kidney disease with heart failure and stage 1 through stage 4 chronic kidney disease, or unspecified chronic kidney disease: Secondary | ICD-10-CM | POA: Diagnosis present

## 2018-06-02 DIAGNOSIS — R531 Weakness: Secondary | ICD-10-CM | POA: Diagnosis present

## 2018-06-02 DIAGNOSIS — K5000 Crohn's disease of small intestine without complications: Secondary | ICD-10-CM | POA: Diagnosis not present

## 2018-06-02 DIAGNOSIS — I10 Essential (primary) hypertension: Secondary | ICD-10-CM | POA: Diagnosis not present

## 2018-06-02 DIAGNOSIS — I340 Nonrheumatic mitral (valve) insufficiency: Secondary | ICD-10-CM | POA: Diagnosis not present

## 2018-06-02 DIAGNOSIS — E785 Hyperlipidemia, unspecified: Secondary | ICD-10-CM | POA: Diagnosis present

## 2018-06-02 DIAGNOSIS — I214 Non-ST elevation (NSTEMI) myocardial infarction: Secondary | ICD-10-CM | POA: Diagnosis not present

## 2018-06-02 DIAGNOSIS — Z79899 Other long term (current) drug therapy: Secondary | ICD-10-CM

## 2018-06-02 DIAGNOSIS — I25110 Atherosclerotic heart disease of native coronary artery with unstable angina pectoris: Secondary | ICD-10-CM | POA: Diagnosis not present

## 2018-06-02 DIAGNOSIS — Z7952 Long term (current) use of systemic steroids: Secondary | ICD-10-CM

## 2018-06-02 DIAGNOSIS — Z6824 Body mass index (BMI) 24.0-24.9, adult: Secondary | ICD-10-CM

## 2018-06-02 DIAGNOSIS — K50918 Crohn's disease, unspecified, with other complication: Secondary | ICD-10-CM | POA: Diagnosis not present

## 2018-06-02 DIAGNOSIS — K5090 Crohn's disease, unspecified, without complications: Secondary | ICD-10-CM | POA: Diagnosis present

## 2018-06-02 DIAGNOSIS — F411 Generalized anxiety disorder: Secondary | ICD-10-CM | POA: Diagnosis not present

## 2018-06-02 DIAGNOSIS — R079 Chest pain, unspecified: Secondary | ICD-10-CM | POA: Diagnosis present

## 2018-06-02 DIAGNOSIS — G894 Chronic pain syndrome: Secondary | ICD-10-CM | POA: Diagnosis not present

## 2018-06-02 DIAGNOSIS — E876 Hypokalemia: Secondary | ICD-10-CM | POA: Diagnosis present

## 2018-06-02 DIAGNOSIS — Z880 Allergy status to penicillin: Secondary | ICD-10-CM

## 2018-06-02 DIAGNOSIS — F17210 Nicotine dependence, cigarettes, uncomplicated: Secondary | ICD-10-CM | POA: Diagnosis present

## 2018-06-02 DIAGNOSIS — E871 Hypo-osmolality and hyponatremia: Secondary | ICD-10-CM | POA: Diagnosis not present

## 2018-06-02 DIAGNOSIS — R739 Hyperglycemia, unspecified: Secondary | ICD-10-CM | POA: Diagnosis not present

## 2018-06-02 DIAGNOSIS — I252 Old myocardial infarction: Secondary | ICD-10-CM

## 2018-06-02 DIAGNOSIS — I5041 Acute combined systolic (congestive) and diastolic (congestive) heart failure: Secondary | ICD-10-CM | POA: Diagnosis not present

## 2018-06-02 LAB — BASIC METABOLIC PANEL
Anion gap: 15 (ref 5–15)
Anion gap: 13 (ref 5–15)
BUN: 25 mg/dL — ABNORMAL HIGH (ref 8–23)
BUN: 27 mg/dL — ABNORMAL HIGH (ref 8–23)
CO2: 17 mmol/L — ABNORMAL LOW (ref 22–32)
CO2: 17 mmol/L — ABNORMAL LOW (ref 22–32)
Calcium: 8.4 mg/dL — ABNORMAL LOW (ref 8.9–10.3)
Calcium: 8.7 mg/dL — ABNORMAL LOW (ref 8.9–10.3)
Chloride: 113 mmol/L — ABNORMAL HIGH (ref 98–111)
Chloride: 114 mmol/L — ABNORMAL HIGH (ref 98–111)
Creatinine, Ser: 1.18 mg/dL (ref 0.61–1.24)
Creatinine, Ser: 1.3 mg/dL — ABNORMAL HIGH (ref 0.61–1.24)
GFR calc Af Amer: >60 mL/min (ref >60)
GFR calc Af Amer: >60 mL/min (ref >60)
GFR calc non Af Amer: >60 mL/min (ref >60)
GFR calc non Af Amer: 55 mL/min — ABNORMAL LOW (ref >60)
Glucose, Bld: 83 mg/dL (ref 70–99)
Glucose, Bld: 211 mg/dL — ABNORMAL HIGH (ref 70–99)
Potassium: 3.1 mmol/L — ABNORMAL LOW (ref 3.5–5.1)
Potassium: 4.1 mmol/L (ref 3.5–5.1)
Sodium: 145 mmol/L (ref 135–145)
Sodium: 144 mmol/L (ref 135–145)

## 2018-06-02 LAB — CBC
HCT: 32.4 % — ABNORMAL LOW (ref 39.0–52.0)
Hemoglobin: 9.2 g/dL — ABNORMAL LOW (ref 13.0–17.0)
MCH: 25.8 pg — ABNORMAL LOW (ref 26.0–34.0)
MCHC: 28.4 g/dL — ABNORMAL LOW (ref 30.0–36.0)
MCV: 91 fL (ref 80.0–100.0)
Platelets: 245 10*3/uL (ref 150–400)
RBC: 3.56 MIL/uL — ABNORMAL LOW (ref 4.22–5.81)
RDW: 16.8 % — ABNORMAL HIGH (ref 11.5–15.5)
WBC: 16.7 10*3/uL — ABNORMAL HIGH (ref 4.0–10.5)
nRBC: 0 % (ref 0.0–0.2)

## 2018-06-02 LAB — TROPONIN I
Troponin I: 0.03 ng/mL (ref <0.03)
Troponin I: 0.03 ng/mL (ref <0.03)
Troponin I: <0.03 ng/mL (ref <0.03)
Troponin I: 0.14 ng/mL (ref <0.03)

## 2018-06-02 LAB — PROTIME-INR
INR: 1 (ref 0.8–1.2)
Prothrombin Time: 13.5 seconds (ref 11.4–15.2)

## 2018-06-02 LAB — GLUCOSE, CAPILLARY
Glucose-Capillary: 84 mg/dL (ref 70–99)
Glucose-Capillary: 134 mg/dL — ABNORMAL HIGH (ref 70–99)
Glucose-Capillary: 185 mg/dL — ABNORMAL HIGH (ref 70–99)
Glucose-Capillary: 117 mg/dL — ABNORMAL HIGH (ref 70–99)

## 2018-06-02 LAB — MAGNESIUM
Magnesium: 1 mg/dL — ABNORMAL LOW (ref 1.7–2.4)
Magnesium: 1.1 mg/dL — ABNORMAL LOW (ref 1.7–2.4)

## 2018-06-02 MED ORDER — DILTIAZEM LOAD VIA INFUSION
15.0000 mg | Freq: Once | INTRAVENOUS | Status: AC
  Administered 2018-06-02: 15 mg via INTRAVENOUS
  Filled 2018-06-02: qty 15

## 2018-06-02 MED ORDER — BETHANECHOL CHLORIDE 10 MG PO TABS
10.0000 mg | ORAL_TABLET | Freq: Three times a day (TID) | ORAL | Status: DC
  Administered 2018-06-02 – 2018-06-06 (×11): 10 mg via ORAL
  Filled 2018-06-02 (×14): qty 1

## 2018-06-02 MED ORDER — MORPHINE SULFATE (PF) 2 MG/ML IV SOLN
2.0000 mg | INTRAVENOUS | Status: DC | PRN
  Administered 2018-06-02: 2 mg via INTRAVENOUS
  Filled 2018-06-02: qty 1

## 2018-06-02 MED ORDER — POTASSIUM CHLORIDE CRYS ER 20 MEQ PO TBCR
20.0000 meq | EXTENDED_RELEASE_TABLET | Freq: Four times a day (QID) | ORAL | Status: DC
  Administered 2018-06-02: 20 meq via ORAL
  Filled 2018-06-02: qty 1

## 2018-06-02 MED ORDER — METHOCARBAMOL 500 MG PO TABS
750.0000 mg | ORAL_TABLET | Freq: Four times a day (QID) | ORAL | Status: DC | PRN
  Administered 2018-06-03 (×2): 750 mg via ORAL
  Filled 2018-06-02 (×2): qty 2

## 2018-06-02 MED ORDER — HYDROCODONE-ACETAMINOPHEN 7.5-325 MG PO TABS
1.0000 | ORAL_TABLET | Freq: Three times a day (TID) | ORAL | Status: DC | PRN
  Administered 2018-06-03 – 2018-06-06 (×10): 1 via ORAL
  Filled 2018-06-02 (×11): qty 1

## 2018-06-02 MED ORDER — METOPROLOL TARTRATE 12.5 MG HALF TABLET
12.5000 mg | ORAL_TABLET | Freq: Two times a day (BID) | ORAL | Status: DC
  Administered 2018-06-02 – 2018-06-03 (×2): 12.5 mg via ORAL
  Filled 2018-06-02 (×2): qty 1

## 2018-06-02 MED ORDER — ALBUTEROL SULFATE (2.5 MG/3ML) 0.083% IN NEBU: 2.5000 mg | INHALATION_SOLUTION | RESPIRATORY_TRACT | Status: DC | PRN

## 2018-06-02 MED ORDER — VITAMIN B-1 100 MG PO TABS
100.0000 mg | ORAL_TABLET | Freq: Every day | ORAL | Status: DC
  Administered 2018-06-02 – 2018-06-06 (×5): 100 mg via ORAL
  Filled 2018-06-02 (×5): qty 1

## 2018-06-02 MED ORDER — ACETAMINOPHEN 325 MG PO TABS: 650.0000 mg | ORAL_TABLET | ORAL | Status: DC | PRN

## 2018-06-02 MED ORDER — POTASSIUM CHLORIDE 10 MEQ/100ML IV SOLN: 10.0000 meq | INTRAVENOUS | Status: DC

## 2018-06-02 MED ORDER — MORPHINE SULFATE (PF) 2 MG/ML IV SOLN
INTRAVENOUS | Status: AC
  Filled 2018-06-02: qty 1

## 2018-06-02 MED ORDER — ATORVASTATIN CALCIUM 40 MG PO TABS
40.0000 mg | ORAL_TABLET | Freq: Every day | ORAL | Status: DC
  Administered 2018-06-03 – 2018-06-05 (×3): 40 mg via ORAL
  Filled 2018-06-02 (×3): qty 1

## 2018-06-02 MED ORDER — NITROGLYCERIN 0.4 MG SL SUBL
0.4000 mg | SUBLINGUAL_TABLET | SUBLINGUAL | Status: DC | PRN
  Administered 2018-06-02 (×2): 0.4 mg via SUBLINGUAL

## 2018-06-02 MED ORDER — INSULIN GLARGINE 100 UNIT/ML ~~LOC~~ SOLN
7.0000 [IU] | Freq: Every day | SUBCUTANEOUS | Status: DC
  Administered 2018-06-02 – 2018-06-05 (×4): 7 [IU] via SUBCUTANEOUS
  Filled 2018-06-02 (×5): qty 0.07

## 2018-06-02 MED ORDER — POTASSIUM CHLORIDE CRYS ER 20 MEQ PO TBCR: 40.0000 meq | EXTENDED_RELEASE_TABLET | Freq: Once | ORAL | Status: DC

## 2018-06-02 MED ORDER — FUROSEMIDE 10 MG/ML IJ SOLN
80.0000 mg | INTRAMUSCULAR | Status: AC
  Administered 2018-06-02: 40 mg via INTRAVENOUS

## 2018-06-02 MED ORDER — MORPHINE SULFATE (PF) 2 MG/ML IV SOLN
2.0000 mg | Freq: Once | INTRAVENOUS | Status: AC
  Administered 2018-06-02: 2 mg via INTRAVENOUS

## 2018-06-02 MED ORDER — FOLIC ACID 1 MG PO TABS
1.0000 mg | ORAL_TABLET | Freq: Every day | ORAL | Status: DC
  Administered 2018-06-03 – 2018-06-06 (×4): 1 mg via ORAL
  Filled 2018-06-02 (×4): qty 1

## 2018-06-02 MED ORDER — NITROGLYCERIN 0.4 MG SL SUBL
SUBLINGUAL_TABLET | SUBLINGUAL | Status: AC
  Filled 2018-06-02: qty 1

## 2018-06-02 MED ORDER — ALUM & MAG HYDROXIDE-SIMETH 200-200-20 MG/5ML PO SUSP
30.0000 mL | Freq: Four times a day (QID) | ORAL | Status: DC | PRN
  Administered 2018-06-02: 30 mL via ORAL
  Filled 2018-06-02: qty 30

## 2018-06-02 MED ORDER — HYDROCODONE-ACETAMINOPHEN 5-325 MG PO TABS
1.0000 | ORAL_TABLET | Freq: Two times a day (BID) | ORAL | Status: DC | PRN
  Administered 2018-06-02: 1 via ORAL
  Filled 2018-06-02: qty 1

## 2018-06-02 MED ORDER — FUROSEMIDE 10 MG/ML IJ SOLN
INTRAMUSCULAR | Status: AC
  Filled 2018-06-02: qty 4

## 2018-06-02 MED ORDER — MAGNESIUM SULFATE 2 GM/50ML IV SOLN
2.0000 g | Freq: Once | INTRAVENOUS | Status: AC
  Administered 2018-06-02: 20:00:00 2 g via INTRAVENOUS
  Filled 2018-06-02: qty 50

## 2018-06-02 MED ORDER — PREDNISONE 10 MG PO TABS
10.0000 mg | ORAL_TABLET | Freq: Every day | ORAL | Status: DC
  Administered 2018-06-03: 09:00:00 10 mg via ORAL
  Filled 2018-06-02: qty 1

## 2018-06-02 MED ORDER — ASPIRIN EC 81 MG PO TBEC
81.0000 mg | DELAYED_RELEASE_TABLET | Freq: Every day | ORAL | Status: DC
  Administered 2018-06-02: 81 mg via ORAL
  Filled 2018-06-02: qty 1

## 2018-06-02 MED ORDER — ACETAMINOPHEN 500 MG PO TABS
1000.0000 mg | ORAL_TABLET | Freq: Four times a day (QID) | ORAL | Status: DC | PRN
  Filled 2018-06-02: qty 2

## 2018-06-02 MED ORDER — MORPHINE SULFATE (PF) 2 MG/ML IV SOLN
INTRAVENOUS | Status: AC
  Administered 2018-06-02: 2 mg via INTRAVENOUS
  Filled 2018-06-02: qty 1

## 2018-06-02 MED ORDER — HEPARIN SODIUM (PORCINE) 5000 UNIT/ML IJ SOLN
5000.0000 [IU] | Freq: Three times a day (TID) | INTRAMUSCULAR | Status: DC
  Administered 2018-06-02 – 2018-06-06 (×12): 5000 [IU] via SUBCUTANEOUS
  Filled 2018-06-02 (×12): qty 1

## 2018-06-02 MED ORDER — NITROGLYCERIN 0.4 MG SL SUBL: 0.4000 mg | SUBLINGUAL_TABLET | SUBLINGUAL | Status: DC | PRN

## 2018-06-02 MED ORDER — ASPIRIN EC 81 MG PO TBEC
81.0000 mg | DELAYED_RELEASE_TABLET | Freq: Every day | ORAL | Status: DC
  Administered 2018-06-02 – 2018-06-06 (×5): 81 mg via ORAL
  Filled 2018-06-02 (×4): qty 1

## 2018-06-02 MED ORDER — ALBUTEROL SULFATE HFA 108 (90 BASE) MCG/ACT IN AERS: 1.0000 | INHALATION_SPRAY | RESPIRATORY_TRACT | Status: DC | PRN

## 2018-06-02 MED ORDER — MORPHINE SULFATE (PF) 2 MG/ML IV SOLN
2.0000 mg | INTRAVENOUS | Status: DC | PRN
  Administered 2018-06-02 – 2018-06-06 (×23): 2 mg via INTRAVENOUS
  Filled 2018-06-02 (×23): qty 1

## 2018-06-02 MED ORDER — DILTIAZEM HCL 60 MG PO TABS
30.0000 mg | ORAL_TABLET | Freq: Three times a day (TID) | ORAL | Status: DC
  Administered 2018-06-02: 30 mg via ORAL
  Filled 2018-06-02: qty 1

## 2018-06-02 MED ORDER — HYDRALAZINE HCL 25 MG PO TABS
25.0000 mg | ORAL_TABLET | Freq: Three times a day (TID) | ORAL | Status: DC
  Administered 2018-06-02 – 2018-06-06 (×11): 25 mg via ORAL
  Filled 2018-06-02 (×11): qty 1

## 2018-06-02 MED ORDER — TAMSULOSIN HCL 0.4 MG PO CAPS
0.4000 mg | ORAL_CAPSULE | Freq: Every day | ORAL | Status: DC
  Administered 2018-06-02 – 2018-06-05 (×4): 0.4 mg via ORAL
  Filled 2018-06-02 (×4): qty 1

## 2018-06-02 MED ORDER — DILTIAZEM HCL-DEXTROSE 100-5 MG/100ML-% IV SOLN (PREMIX)
5.0000 mg/h | INTRAVENOUS | Status: DC
  Administered 2018-06-02 – 2018-06-05 (×9): 15 mg/h via INTRAVENOUS
  Filled 2018-06-02 (×10): qty 100

## 2018-06-02 MED ORDER — ONDANSETRON HCL 4 MG/2ML IJ SOLN
4.0000 mg | Freq: Four times a day (QID) | INTRAMUSCULAR | Status: DC | PRN
  Administered 2018-06-04 – 2018-06-06 (×7): 4 mg via INTRAVENOUS
  Filled 2018-06-02 (×7): qty 2

## 2018-06-02 MED ORDER — LORATADINE 10 MG PO TABS
10.0000 mg | ORAL_TABLET | Freq: Every day | ORAL | Status: DC
  Administered 2018-06-03 – 2018-06-06 (×4): 10 mg via ORAL
  Filled 2018-06-02 (×4): qty 1

## 2018-06-02 MED ORDER — PANTOPRAZOLE SODIUM 40 MG PO TBEC
40.0000 mg | DELAYED_RELEASE_TABLET | Freq: Two times a day (BID) | ORAL | Status: DC
  Administered 2018-06-02 – 2018-06-06 (×8): 40 mg via ORAL
  Filled 2018-06-02 (×8): qty 1

## 2018-06-02 MED ORDER — METOPROLOL TARTRATE 5 MG/5ML IV SOLN
2.5000 mg | INTRAVENOUS | Status: AC
  Administered 2018-06-02: 2.5 mg via INTRAVENOUS
  Filled 2018-06-02: qty 5

## 2018-06-02 NOTE — Significant Event (Signed)
Rapid Response Event Note  Overview: Cardiac - Chest Pain and Respiratory - Shortness of Breath   Initial Focused Assessment: Called by 95M nurse to assess patient with acute onset of chest pain coupled with shortness of breath. Upon arrival, patient was complaining of severe 7/10 substernal chest pain that radiated from the right side of chest to the left side side of the chest. Skin color - gray in color, mild diaphoresis, very short of breath, only able to speak a few words at time, labored, moderate work of breathing as well. Pulse was +2, thready, and irregular, SBP in the 140-150s. Patient was placed on NRB 15L 100% - was not hypoxic but placed for WOB and distress. Lung sounds - coarse crackles throughout, mild wheezing in the upper air fields. MD on call was called by 95M nurse and MD consulted TRH MD. [facility] MD came to bedside to evaluate patient.   Interventions: -- STAT EKG - AF RVR - 150s - mild EKG changes -- NTG SL 0.4 mg x 2 - no relief -- STAT LABS - Troponin, BMET, CBC, PT/INR -- STAT CXR - mild bilateral pulmonary edema -- VAST RN - PIV started  -- Morphine 4 mg IV total given - chest pressure improved to 3/10.  -- Lopressor 2.5 mg IV x 1 - HR improved to low 110-130 range (was in the 140-160 range prior). -- Lasix 40 mg IV x 1  Plan of Care: -- Patient was placed on monitor on 95M, SBP remained in the 120-130s after medications, HR improved 110-130s still AF. Transitioned to VM 8L and saturations remained 93-95%. Patient was transferred to 4E20. If needed, patient can be placed on BIPAP. Once on 4E, patient's overall condition improved, Cardizem bolus and infusion were started. Patient's skin color improved, shortness of breath improved.  -- RN to monitor HR/BP  -- Wean oxygen as patient tolerates - if needed can be placed on BIPAP for SOB/WOB -- Monitor UOP and I/O (has not voided since Lasix was given)  Event Summary:  Call Time 1500 Arrival Time 1502  End Time  1725    at     Renaye Janicki R

## 2018-06-02 NOTE — Progress Notes (Addendum)
Richard Wang is a 73 y.o. male [birth year] [id number]  Subjective: C/o irregular heart beat. C/o severe chronic LBP - worse 7-9/10. He used to be on Norco for a long time  Objective: Vital signs in last 24 hours: Temp:  [97.7 F (36.5 C)-98.2 F (36.8 C)] 97.7 F (36.5 C) (04/12 0411) Pulse Rate:  [58-100] 77 (04/12 0815) Resp:  [18-20] 20 (04/12 0411) BP: (119-143)/(66-94) 143/82 (04/12 0815) SpO2:  [100 %] 100 % (04/12 0411) Weight:  [76.4 kg] 76.4 kg (04/12 0422) Weight change:  Last BM Date: 06/01/18  Intake/Output from previous day: 04/11 0701 - 04/12 0700 In: 1280 [P.O.:1280] Out: 1150 [Urine:1150] Last cbgs: CBG (last 3)  Recent Labs    06/01/18 1708 06/01/18 2119 06/02/18 0604  GLUCAP 146* 145* 84     Physical Exam General: No apparent distress   HEENT: not dry Lungs: Normal effort. Lungs clear to auscultation w/decr sounds at bases Cardiovascular: irregular rate and rhythm, no edema Abdomen: S/NT/ND; BS(+) Musculoskeletal:  Unchanged. LS - painful Neurological: No new neurological deficits Wounds: N/A    Skin: clear  Aging changes Mental state: Alert, oriented, cooperative    Lab Results: BMET    Component Value Date/Time   NA 145 06/02/2018 0415   K 3.1 (L) 06/02/2018 0415   CL 113 (H) 06/02/2018 0415   CO2 17 (L) 06/02/2018 0415   GLUCOSE 83 06/02/2018 0415   BUN 25 (H) 06/02/2018 0415   CREATININE 1.18 06/02/2018 0415   CALCIUM 8.4 (L) 06/02/2018 0415   GFRNONAA >60 06/02/2018 0415   GFRAA >60 06/02/2018 0415   CBC    Component Value Date/Time   WBC 9.1 06/01/2018 1426   RBC 3.05 (L) 06/01/2018 1426   HGB 8.1 (L) 06/01/2018 1426   HCT 27.5 (L) 06/01/2018 1426   PLT 156 06/01/2018 1426   MCV 90.2 06/01/2018 1426   MCH 26.6 06/01/2018 1426   MCHC 29.5 (L) 06/01/2018 1426   RDW 16.9 (H) 06/01/2018 1426   LYMPHSABS 1.2 05/27/2018 0916   MONOABS 0.4 05/27/2018 0916   EOSABS 0.0 05/27/2018 0916   BASOSABS 0.0 05/27/2018 0916     Studies/Results: No results found.  Medications: I have reviewed the patient's current medications.  Assessment/Plan:   1.  Debility secondary to acute on chronic respiratory failure.  Continue CIR 2.  DVT prophylaxis with subcutaneous heparin.  Aspirin was discontinued 3.  Pain (LBP) - worse.  She was on chronic opioids at home.  We will give Norco prn with caution. Tylenol PRN.  Methocarbamol PRN.  K pad as needed 4.  Emotional support 5.  Routine skin care 6.  Dysphagia.  On D2 honey liquids 7.  Atrial fibrillation.  Worsening palpitations. Intolerant of anticoagulants (per pt) due to recurrent GI bleeds. On Cardizem - will increase to tid; continue metoprolol. Start baby ASA/PPI. Correct Magnesium level.  Chest discomfort would likely be due to atrial fibrillation.  His troponins remain borderline at 0.03. 8.  Hypertension.  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  Dyslipidemia.  On Lipitor 11.  Respiratory failure, acute on chronic.  Slow steroid taper 12.  Hypoalbuminemia.  On supplements 13.  Chronic anemia.  Monitor CBC 14.  Urinary frequency.  Continue with Flomax.  PVRs suggesting retention.  Bethanechol 3 times a day 15.  Hypokalemia, worse.    Will increase K. Dur 16.  Allergic rhinitis.  Claritin as needed triamcinolone ointment if needed to skin 17. Hypomagnesemia, severe.  Correcting w/Mag oxide po.    Length of stay, days: 16  Sonda PrimesAlex Rosalita Carey , MD 06/02/2018, 9:06 AM

## 2018-06-02 NOTE — Plan of Care (Signed)
  Problem: Consults Goal: RH GENERAL PATIENT EDUCATION Description See Patient Education module for education specifics. Outcome: Progressing Goal: Diabetes Guidelines if Diabetic/Glucose > 140 Description If diabetic or lab glucose is > 140 mg/dl - Initiate Diabetes/Hyperglycemia Guidelines & Document Interventions  Outcome: Progressing   Problem: RH BLADDER ELIMINATION Goal: RH STG MANAGE BLADDER WITH ASSISTANCE Description STG Manage Bladder With min Assistance  Outcome: Progressing   Problem: RH SKIN INTEGRITY Goal: RH STG SKIN FREE OF INFECTION/BREAKDOWN Description Patients skin will remain free from further infection or breakdown with mod I assist.     Outcome: Progressing Goal: RH STG MAINTAIN SKIN INTEGRITY WITH ASSISTANCE Description STG Maintain Skin Integrity With mod I Assistance.  Outcome: Progressing Goal: RH STG ABLE TO PERFORM INCISION/WOUND CARE W/ASSISTANCE Description STG Able To Perform Incision/Wound Care With mod I Assistance.  Outcome: Progressing   Problem: RH SAFETY Goal: RH STG ADHERE TO SAFETY PRECAUTIONS W/ASSISTANCE/DEVICE Description STG Adhere to Safety Precautions With mod I Assistance/Device.  Outcome: Progressing   Problem: RH BOWEL ELIMINATION Goal: RH STG MANAGE BOWEL WITH ASSISTANCE Description STG Manage Bowel with min Assistance.  Outcome: Progressing

## 2018-06-02 NOTE — Progress Notes (Signed)
Nurse called to room approximately 1415 patient reporting indigestion and requesting medication. Got order for maalox and gave PRN norco for chronic pain. BP was 126/92, HR 100. No distress noted at this time. Nurse called back to room approximately 1500 with patient reporting crushing chest pain that travels down both arms and appears very short of breath. Rapid Response called for assistance. EKG done. MD Amador Cunas notified of patient's condition and new orders received. (See RRT note for meds given). MD Criselda Peaches consulted and orders received to transfer pt to stepdown. Patient transferred to 4E20 with this nurse and rapid response about 1700. Patient's emergency contact-niece Danielle notified of patient's move and condition. All belongings transferred to 4E.

## 2018-06-02 NOTE — H&P (Signed)
History and Physical    Richard Wang OEV:[id number] DOB: 10-07-1945 DOA: 06/02/2018  Patient coming from: [facility]  Chief Complaint: Chest pain  HPI: Richard Wang is a 73 y.o. male with medical history significant of COPD, HTN, DM2, Crohn's disease, Afib, CHF (No TTE in our system), CAD, foot drop, low back pain on chronic narcotics who is in rehab after a hospitalization at [facility] for acute on chronic respiratory failure due to CHF and COPD.  He was transitioned to Rehab on no oxygen.  Today, he was intermittently SOB and around 2pm this afternoon developed a rapid heart rate, chest pain, SOB and hypoxia.  He reports that the chest pain is central, somewhat reproducible, associated with radiation down both arms and SOB.  He was somewhat gray when I entered the room at first, however, he was not diaphoretic.  He has had an MI before and he did not feel this pain was similar, but he was worried.  He further had palpitations.  He had had nitro X 2 and morphine 2mg  X 2.  The morphine helped somewhat with the pain.  He was saturating well on 8L of oxygen.  He had a HR of 160s which improved to 140s with morphine and then to 120s-130s with 2.5mg  of metoprolol IV push.  His home meds list amiodarone and carvedilol, however, currently he is on metoprolol 12.5mg  BID and Cardizem 30mg  BID.  His cardizem was increased to TID this AM as his HR was noted to be getting worse and he was started on aspirin.  He is not currently on an anticoagulant as he has had issues with severe bleeding in the past, needed frequent blood transfusions.  He would rather not be on an anticoagulant at this time, however, he is okay with aspirin.  His GI bleeding is thought to be due to his Crohn's disease.  He specifically denied fever, chills, nausea, vomiting, dysuria, change in urine.    When I evaluated the patient in the Rehab unit, he had an elevated HR, was breathing heavily and was being cared for by RRT nursing.   WBC was 16.7, up from 9.1 yesterday.  K was improved to 4.1.  Magnesium will be added on.  Initial troponin was <0.03.  EKG was reviewed and showed rapid rate, possibly some worsening heart block.  Will need to repeat once rate is improved.  CXR done showed mild pulmonary edema.   Review of Systems: As per HPI otherwise 10 point review of systems negative.    Past Medical History:  Diagnosis Date  . Acute and chronic respiratory failure with hypoxia (HCC)   . Acute myocardial infarction, subendocardial infarction, subsequent episode of care (HCC)   . Acute systolic heart failure (HCC)   . Atrial fibrillation (HCC)   . Chronic atrial fibrillation   . Crohn's colitis (HCC)   . Diabetes mellitus without complication (HCC)   . Hypertension   . Sepsis with acute organ dysfunction (HCC)   . Unspecified septicemia(038.9) [facility])     Past Surgical History:  Procedure Laterality Date  . CAROTID ENDARTERECTOMY Bilateral    He reports that he will quit when leaving the hospital this time.   reports that he has been smoking. He has been smoking about 1.50 packs per day. He has never used smokeless tobacco. He reports previous drug use. No history on file for alcohol.  No Known Allergies  Reviewed with patient.  Family History  Problem Relation Age of  Onset  . Bone cancer Brother     Meds while in Rehab facility Tylenol  Q6 hours prn pain Aspirin  (started today) Amlodipine 7.5mg  daily Atorvastatin  daily Bethanechol  TID Diltiazem  TID (changed today) Feeding supplement Folic acid  daily Heparin 5000 units SQ TID Vitamin C  daily Hydralazine  TID Lantus 7 units qhs Loratadine  daily Magnesium Oxide  4 X daily Robaxin  TID prn muscle spasm Metoprolol 12.5mg  BID Nitroglycerin PRN chest pain Pantoprazole  BID KCL 20 mEq 4X daily Tamsulosin 0.4mg  daily Thiamine  daily  Physical Exam: HR 140s BP 145/102 RR: 22 POx 97%   Constitutional: Sitting up in bed, appears very ill, grayish discoloration to skin Eyes: lids and conjunctivae normal ENMT: Mucous membranes are dry.  Neck: normal, supple Respiratory: Crackles bilaterally to mid lung fields, pulse ox was 97%, no wheezing Cardiovascular: Tachycardic, irregular, no murmur noted, + TTP over left chest and shoulder Abdomen: + TTP and bruising in lower quadrants, + BS, distended but soft Musculoskeletal: Some mild cyanosis, no clubbing noted.  Muscle tone is low, foot drop on left lower extremity Skin: Chronic skin changes to face, grayish discoloration Neurologic: CN 2-12 grossly intact. Sensation intact, moving all extremities, speech clear and cogent Psychiatric: Appears fatigued, complaining of pain.    Labs on Admission: I have personally reviewed following labs and imaging studies  CBC: Recent Labs  Lab 05/27/18 0916 06/01/18 1426 06/02/18 1536  WBC 7.6 9.1 16.7*  NEUTROABS 5.9  --   --   HGB 8.4* 8.1* 9.2*  HCT 28.7* 27.5* 32.4*  MCV 90.3 90.2 91.0  PLT 153 156 245   Basic Metabolic Panel: Recent Labs  Lab 05/30/18 0531 06/01/18 1426 06/02/18 0415 06/02/18 1536  NA 143 141 145 144  K 3.1* 3.6 3.1* 4.1  CL 111 114* 113* 114*  CO2 22 18* 17* 17*  GLUCOSE 97 207* 83 211*  BUN 32* 28* 25* 27*  CREATININE 1.19 1.07 1.18 1.30*  CALCIUM 8.4* 8.3* 8.4* 8.7*  MG  --  0.9* 1.0*  --    GFR: Estimated Creatinine Clearance: 53.8 mL/min (A) (by C-G formula based on SCr of 1.3 mg/dL (H)). Liver Function Tests: No results for input(s): AST, ALT, ALKPHOS, BILITOT, PROT, ALBUMIN in the last 168 hours. No results for input(s): LIPASE, AMYLASE in the last 168 hours. No results for input(s): AMMONIA in the last 168 hours. Coagulation Profile: Recent Labs  Lab 06/02/18 1536  INR 1.0   Cardiac Enzymes: Recent Labs  Lab 06/01/18 1426 06/01/18 1614 06/01/18 2222 06/02/18 0415 06/02/18 1530  CKTOTAL 20*  --   --   --   --   CKMB 1.3  --    --   --   --   TROPONINI 0.03* 0.03* 0.03* 0.03* <0.03   BNP (last 3 results) No results for input(s): PROBNP in the last 8760 hours. HbA1C: No results for input(s): HGBA1C in the last 72 hours. CBG: Recent Labs  Lab 06/01/18 1151 06/01/18 1708 06/01/18 2119 06/02/18 0604 06/02/18 1208  GLUCAP 186* 146* 145* 84 134*   Lipid Profile: No results for input(s): CHOL, HDL, LDLCALC, TRIG, CHOLHDL, LDLDIRECT in the last 72 hours. Thyroid Function Tests: No results for input(s): TSH, T4TOTAL, FREET4, T3FREE, THYROIDAB in the last 72 hours. Anemia Panel: No results for input(s): VITAMINB12, FOLATE, FERRITIN, TIBC, IRON, RETICCTPCT in the last 72 hours. Urine analysis:    Component Value Date/Time   COLORURINE YELLOW 05/05/2018 1800  APPEARANCEUR CLEAR 05/05/2018 1800   LABSPEC 1.016 05/05/2018 1800   PHURINE 5.0 05/05/2018 1800   GLUCOSEU 50 (A) 05/05/2018 1800   HGBUR SMALL (A) 05/05/2018 1800   BILIRUBINUR NEGATIVE 05/05/2018 1800   KETONESUR NEGATIVE 05/05/2018 1800   PROTEINUR NEGATIVE 05/05/2018 1800   NITRITE NEGATIVE 05/05/2018 1800   LEUKOCYTESUR NEGATIVE 05/05/2018 1800    Radiological Exams on Admission: Dg Chest Port 1 View  Result Date: 06/02/2018 CLINICAL DATA:  Chest pain. EXAM: PORTABLE CHEST 1 VIEW COMPARISON:  None. FINDINGS: The heart size and mediastinal contours are within normal limits. No pneumothorax or pleural effusion is noted. Mild bilateral interstitial densities are noted, including Kerley B lines in both lung bases, concerning for possible pulmonary edema. The visualized skeletal structures are unremarkable. IMPRESSION: Findings concerning for mild bilateral pulmonary edema. Aortic Atherosclerosis (ICD10-I70.0). Electronically Signed   By: Lupita Raider, M.D.   On: 06/02/2018 16:06    EKG: Independently reviewed. Tachycardia, conduction delay, ? Worsening RBBB  Assessment/Plan Atrial fibrillation with RVR  - Transfer to progressive unit -  Diltiazem drip with 15mg  bolus - Monitor on telemetry - Trend troponin - Repeat EKG once HR improved - Hold PO cardizem - Continue metoprolol 12.5mg  BID - Consider cardiology consult, previously on amiodarone - He reports continued bleeding and frequent blood transfusions while on anticoagulation, would opt to not be on this medication at this time.  I did continue low dose aspirin  Hypokalemia/Hypomagnesemia - K is improved on recheck - Magnesium 1.0 - ordered Mag Sulfate 2g IV - Oral magnesium if needed - Trend magnesium  Chest pain, SOB, pulmonary edema - I think this is all rate related at this time - Continue PO lasix - Wean oxygen as tolerated - Repeat CXR in the AM - Trend troponin - EKG in the AM - Albuterol MDI  CHF - unknown whether systolic or diastolic, acute or chronic - Check TTE - Continue oral lasix - Strict I/O - Daily weight  AKI on CKD - Cr up to 1.6 - Trend - Lasix as above  Leukocytosis - Unclear cause, he is on chronic prednisone for his COPD exacerbation with a slow taper which may explain the elevation.  He may also have an underlying infection explaining his Afib - Check UA - Continue prednisone at 10mg , slow taper  Normocytic anemia - Appears at baseline - Check B12 - Continue folate - Check iron studies  Crohn's disease - Chronic, on prednisone - Continue prednisone, monitor for symptoms  HTN - BP well controlled at this time - Continue metoprolol, diltiazem drip - Continue hydralazine, uptitrate as needed - D/C amlodipine given he will be on diltiazem  COPD - He was admitted for acute on chronic respiratory failure related to COPD and CHF - Continue MDI - Oxygen as needed - Continue prednisone with slow taper  CAD - Trend troponin - Daily aspirin - Telemetry - Metoprolol  Low back pain - PRN morphine for 3 doses - Continue hydrocodone and tylenol   DVT prophylaxis: Heparin SQ  Code Status: Full   Disposition Plan:  Admit to the hospital, progressive for IV medication  Consults called: None, may need cardiology  Admission status: Progressive, INP    Debe Coder MD [facility]   If 7PM-7AM, please contact night-coverage [email or web address]  06/02/2018, 4:56 PM

## 2018-06-02 NOTE — Progress Notes (Signed)
Lab called with critical troponin. 2055: on call triad text paged with troponin of 0.14 , chest pain 3/10 with recent morphine. Vitals stable, 02 sat 95% in ventri mask.  2058[name] called back from triad, updated on pt's condition. Stated will change his morphine to q2 hrs. Will continue to monitor.

## 2018-06-03 ENCOUNTER — Inpatient Hospital Stay (HOSPITAL_COMMUNITY): Payer: Medicare Other | Admitting: Occupational Therapy

## 2018-06-03 ENCOUNTER — Inpatient Hospital Stay (HOSPITAL_COMMUNITY): Payer: Medicare Other | Admitting: Physical Therapy

## 2018-06-03 ENCOUNTER — Inpatient Hospital Stay (HOSPITAL_COMMUNITY): Payer: Medicare Other

## 2018-06-03 DIAGNOSIS — K5090 Crohn's disease, unspecified, without complications: Secondary | ICD-10-CM | POA: Diagnosis present

## 2018-06-03 DIAGNOSIS — I482 Chronic atrial fibrillation, unspecified: Principal | ICD-10-CM

## 2018-06-03 DIAGNOSIS — E876 Hypokalemia: Secondary | ICD-10-CM

## 2018-06-03 DIAGNOSIS — I340 Nonrheumatic mitral (valve) insufficiency: Secondary | ICD-10-CM

## 2018-06-03 DIAGNOSIS — J9621 Acute and chronic respiratory failure with hypoxia: Secondary | ICD-10-CM

## 2018-06-03 DIAGNOSIS — J449 Chronic obstructive pulmonary disease, unspecified: Secondary | ICD-10-CM

## 2018-06-03 DIAGNOSIS — N179 Acute kidney failure, unspecified: Secondary | ICD-10-CM

## 2018-06-03 DIAGNOSIS — I5021 Acute systolic (congestive) heart failure: Secondary | ICD-10-CM

## 2018-06-03 DIAGNOSIS — I10 Essential (primary) hypertension: Secondary | ICD-10-CM

## 2018-06-03 DIAGNOSIS — K5000 Crohn's disease of small intestine without complications: Secondary | ICD-10-CM

## 2018-06-03 LAB — CBC
HCT: 31.8 % — ABNORMAL LOW (ref 39.0–52.0)
Hemoglobin: 9.1 g/dL — ABNORMAL LOW (ref 13.0–17.0)
MCH: 25.8 pg — ABNORMAL LOW (ref 26.0–34.0)
MCHC: 28.6 g/dL — ABNORMAL LOW (ref 30.0–36.0)
MCV: 90.1 fL (ref 80.0–100.0)
Platelets: 197 10*3/uL (ref 150–400)
RBC: 3.53 MIL/uL — ABNORMAL LOW (ref 4.22–5.81)
RDW: 17 % — ABNORMAL HIGH (ref 11.5–15.5)
WBC: 12.5 10*3/uL — ABNORMAL HIGH (ref 4.0–10.5)
nRBC: 0 % (ref 0.0–0.2)

## 2018-06-03 LAB — URINALYSIS, ROUTINE W REFLEX MICROSCOPIC
Bilirubin Urine: NEGATIVE
Glucose, UA: NEGATIVE mg/dL
Hgb urine dipstick: NEGATIVE
Ketones, ur: NEGATIVE mg/dL
Nitrite: POSITIVE — AB
Protein, ur: NEGATIVE mg/dL
Specific Gravity, Urine: 1.008 (ref 1.005–1.030)
pH: 5 (ref 5.0–8.0)

## 2018-06-03 LAB — BLOOD GAS, ARTERIAL
Acid-base deficit: 1 mmol/L (ref 0.0–2.0)
Bicarbonate: 22.7 mmol/L (ref 20.0–28.0)
Drawn by: 257081
FIO2: 1
O2 Saturation: 96.9 %
Patient temperature: 98.6
pCO2 arterial: 35 mmHg (ref 32.0–48.0)
pH, Arterial: 7.428 (ref 7.350–7.450)
pO2, Arterial: 86.7 mmHg (ref 83.0–108.0)

## 2018-06-03 LAB — TROPONIN I
Troponin I: 0.98 ng/mL (ref <0.03)
Troponin I: 1.24 ng/mL (ref <0.03)
Troponin I: 1.04 ng/mL (ref <0.03)
Troponin I: 0.72 ng/mL (ref <0.03)

## 2018-06-03 LAB — TSH: TSH: 1.411 u[IU]/mL (ref 0.350–4.500)

## 2018-06-03 LAB — BASIC METABOLIC PANEL
Anion gap: 11 (ref 5–15)
BUN: 26 mg/dL — ABNORMAL HIGH (ref 8–23)
CO2: 20 mmol/L — ABNORMAL LOW (ref 22–32)
Calcium: 9 mg/dL (ref 8.9–10.3)
Chloride: 113 mmol/L — ABNORMAL HIGH (ref 98–111)
Creatinine, Ser: 1.2 mg/dL (ref 0.61–1.24)
GFR calc Af Amer: >60 mL/min (ref >60)
GFR calc non Af Amer: >60 mL/min (ref >60)
Glucose, Bld: 97 mg/dL (ref 70–99)
Potassium: 3.8 mmol/L (ref 3.5–5.1)
Sodium: 144 mmol/L (ref 135–145)

## 2018-06-03 LAB — GLUCOSE, CAPILLARY
Glucose-Capillary: 114 mg/dL — ABNORMAL HIGH (ref 70–99)
Glucose-Capillary: 93 mg/dL (ref 70–99)
Glucose-Capillary: 195 mg/dL — ABNORMAL HIGH (ref 70–99)
Glucose-Capillary: 251 mg/dL — ABNORMAL HIGH (ref 70–99)

## 2018-06-03 LAB — IRON AND TIBC
Iron: 25 ug/dL — ABNORMAL LOW (ref 45–182)
Saturation Ratios: 7 % — ABNORMAL LOW (ref 17.9–39.5)
TIBC: 337 ug/dL (ref 250–450)
UIBC: 312 ug/dL

## 2018-06-03 LAB — ECHOCARDIOGRAM COMPLETE: Weight: 2694.9 oz

## 2018-06-03 LAB — FERRITIN: Ferritin: 25 ng/mL (ref 24–336)

## 2018-06-03 LAB — VITAMIN B12: Vitamin B-12: 427 pg/mL (ref 180–914)

## 2018-06-03 LAB — MAGNESIUM: Magnesium: 1.8 mg/dL (ref 1.7–2.4)

## 2018-06-03 MED ORDER — LEVALBUTEROL HCL 0.63 MG/3ML IN NEBU: 0.6300 mg | INHALATION_SOLUTION | Freq: Once | RESPIRATORY_TRACT | Status: AC

## 2018-06-03 MED ORDER — METHYLPREDNISOLONE SODIUM SUCC 125 MG IJ SOLR
80.0000 mg | Freq: Every day | INTRAMUSCULAR | Status: DC
  Administered 2018-06-03 – 2018-06-04 (×2): 80 mg via INTRAVENOUS
  Filled 2018-06-03 (×2): qty 2

## 2018-06-03 MED ORDER — FUROSEMIDE 10 MG/ML IJ SOLN
40.0000 mg | Freq: Every day | INTRAMUSCULAR | Status: DC
  Administered 2018-06-04 – 2018-06-06 (×3): 40 mg via INTRAVENOUS
  Filled 2018-06-03 (×3): qty 4

## 2018-06-03 MED ORDER — PERFLUTREN LIPID MICROSPHERE
1.0000 mL | INTRAVENOUS | Status: AC | PRN
  Administered 2018-06-03: 2 mL via INTRAVENOUS
  Filled 2018-06-03: qty 10

## 2018-06-03 MED ORDER — METOPROLOL TARTRATE 12.5 MG HALF TABLET
12.5000 mg | ORAL_TABLET | Freq: Once | ORAL | Status: AC
  Administered 2018-06-03: 13:00:00 12.5 mg via ORAL
  Filled 2018-06-03: qty 1

## 2018-06-03 MED ORDER — AMIODARONE HCL 200 MG PO TABS: 200.0000 mg | ORAL_TABLET | Freq: Two times a day (BID) | ORAL | Status: DC

## 2018-06-03 MED ORDER — AMIODARONE HCL 200 MG PO TABS
200.0000 mg | ORAL_TABLET | Freq: Every day | ORAL | Status: DC
  Administered 2018-06-03 – 2018-06-06 (×4): 200 mg via ORAL
  Filled 2018-06-03 (×4): qty 1

## 2018-06-03 MED ORDER — LEVALBUTEROL HCL 0.63 MG/3ML IN NEBU
INHALATION_SOLUTION | RESPIRATORY_TRACT | Status: AC
  Administered 2018-06-03: 14:00:00 0.63 mg
  Filled 2018-06-03: qty 3

## 2018-06-03 MED ORDER — METOPROLOL TARTRATE 25 MG PO TABS
25.0000 mg | ORAL_TABLET | Freq: Two times a day (BID) | ORAL | Status: DC
  Administered 2018-06-03 – 2018-06-06 (×6): 25 mg via ORAL
  Filled 2018-06-03 (×6): qty 1

## 2018-06-03 MED ORDER — LEVALBUTEROL HCL 1.25 MG/0.5ML IN NEBU
1.2500 mg | INHALATION_SOLUTION | Freq: Four times a day (QID) | RESPIRATORY_TRACT | Status: DC
  Administered 2018-06-03 – 2018-06-04 (×3): 1.25 mg via RESPIRATORY_TRACT
  Filled 2018-06-03 (×3): qty 0.5

## 2018-06-03 MED ORDER — FUROSEMIDE 10 MG/ML IJ SOLN
80.0000 mg | Freq: Once | INTRAMUSCULAR | Status: AC
  Administered 2018-06-03: 13:00:00 80 mg via INTRAVENOUS
  Filled 2018-06-03: qty 8

## 2018-06-03 NOTE — Progress Notes (Addendum)
Patient with increased work of breathing. Stating " I cant breathe!" using abdominal muscles to breathe patient placed on veti mask at 8-10 L and sating still at 83 percent. Patient quickly changed to nonrebreather. Breath sounds with wheezing.  And after several minutes patient saturation increased to 94-96% and breathing has slowed down and patient states he feels  a little  better. Will monitor patient. Korey Prashad, Randall An RN

## 2018-06-03 NOTE — Significant Event (Signed)
Rapid Response Event Note  Overview: Respiratory - Distress  Initial Focused Assessment: Called by RT about patient having oxygen saturations in the 80s and now is on NRB 15L 100%. Lung sounds - wheezing on the LT side per RT, + accessory muscles, increased WOB, moderately distressed. HR much better, still AF 100-110, stable BP. MD called by RR RN.   Interventions: - STAT CXR - STAT ABG  - BIPAP  Plan of Care: - BIPAP for a few hours. Patient Lasix 80 mg IV x 1 prior to RRT call, only voided 100 cc per nurse. Nurse will bladder scan as well.  Event Summary:  Call Time 1348 Arrival Time 1350  End Time 1445   Richard Wang R

## 2018-06-03 NOTE — Discharge Summary (Signed)
Physician Discharge Summary  Patient ID: Richard SandiferMichael A Wang MRN: [id number] DOB/AGE: 1945-09-05 73 y.o.  Admit date: 05/17/2018 Discharge date: 06/02/2018  Discharge Diagnoses:  Active Problems:   Atrial fibrillation with RVR (HCC)   Shortness of breath Debility secondary to acute on chronic respiratory failure Pain management Hypertension Steroid-induced Hyperglycemia Anemia normocytic Hyperlipidemia Urinary frequency      Discharged Condition: guarded  Significant Diagnostic Studies: Dg Abd 1 View  Result Date: 05/12/2018 CLINICAL DATA:  NG tube placement EXAM: ABDOMEN - 1 VIEW COMPARISON:  05/11/2018 FINDINGS: 1628 hours. The tip of the NG tube overlies the mid stomach. Proximal port of the NG tube is just distal to the EG junction. Nonspecific bowel gas pattern in the visualized upper abdomen. Telemetry leads overlie the chest. IMPRESSION: NG tube tip is in the mid stomach. Electronically Signed   By: Kennith CenterEric  Mansell M.D.   On: 05/12/2018 17:45   Dg Chest Port 1 View  Result Date: 06/02/2018 CLINICAL DATA:  Chest pain. EXAM: PORTABLE CHEST 1 VIEW COMPARISON:  None. FINDINGS: The heart size and mediastinal contours are within normal limits. No pneumothorax or pleural effusion is noted. Mild bilateral interstitial densities are noted, including Kerley B lines in both lung bases, concerning for possible pulmonary edema. The visualized skeletal structures are unremarkable. IMPRESSION: Findings concerning for mild bilateral pulmonary edema. Aortic Atherosclerosis (ICD10-I70.0). Electronically Signed   By: Lupita RaiderJames  Green Jr, M.D.   On: 06/02/2018 16:06   Dg Chest Port 1 View  Result Date: 05/07/2018 CLINICAL DATA:  Hypoxia EXAM: PORTABLE CHEST 1 VIEW COMPARISON:  May 06, 2018 FINDINGS: Endotracheal tube tip is 4.0 cm above the carina. Nasogastric tube tip and side port are below the diaphragm. No pneumothorax. There is a calcified granuloma in the left lower lobe. There is no edema or  consolidation. Heart is upper normal in size with pulmonary vascularity normal. No adenopathy. There is aortic atherosclerosis. No bone lesions. IMPRESSION: Tube positions as described without pneumothorax. No edema or consolidation. Stable cardiac silhouette. Small granuloma left lower lobe. Aortic Atherosclerosis (ICD10-I70.0). Electronically Signed   By: Bretta BangWilliam  Woodruff III M.D.   On: 05/07/2018 07:49   Dg Chest Port 1 View  Result Date: 05/06/2018 CLINICAL DATA:  Endotracheal tube position EXAM: PORTABLE CHEST 1 VIEW COMPARISON:  Two days ago FINDINGS: Endotracheal tube tip just below the clavicular heads. The orogastric tube reaches the stomach. Normal heart size for technique. Unchanged haziness of the bilateral chest. No edema, effusion, or pneumothorax. IMPRESSION: 1. Unremarkable hardware positioning. 2. Stable aeration. Electronically Signed   By: Marnee SpringJonathon  Watts M.D.   On: 05/06/2018 05:09   Dg Chest Port 1 View  Result Date: 05/04/2018 CLINICAL DATA:  Follow-up CHF EXAM: PORTABLE CHEST 1 VIEW COMPARISON:  04/28/2010 FINDINGS: An endotracheal tube with tip 6 cm above the carina and NG tube entering the stomach with tip off the field of view again noted. Interstitial edema has resolved Improved bibasilar aeration with continued small effusions and LEFT basilar atelectasis. IMPRESSION: Resolved interstitial pulmonary edema and improved bibasilar aeration. Small bilateral pleural effusions and LEFT LOWER lobe atelectasis persists. Electronically Signed   By: Harmon PierJeffrey  Hu M.D.   On: 05/04/2018 08:33   Dg Abd Portable 1v  Result Date: 05/11/2018 CLINICAL DATA:  Initial evaluation for NG tube placement EXAM: PORTABLE ABDOMEN - 1 VIEW COMPARISON:  Prior radiograph from 05/01/2018 FINDINGS: Enteric tube in place with tip overlying the stomach, side hole well beyond the GE junction. Tube is coiled within the gastric  body. Visualized bowel gas pattern is nonobstructive. Mild subsegmental atelectatic  changes at the right lung base. IMPRESSION: Enteric tube coiled within the stomach, side hole well beyond the GE junction. Electronically Signed   By: Rise Mu M.D.   On: 05/11/2018 04:39    Labs:  Basic Metabolic Panel: Recent Labs  Lab 05/30/18 0531 06/01/18 1426 06/02/18 0415 06/02/18 1536 06/02/18 1912  NA 143 141 145 144  --   K 3.1* 3.6 3.1* 4.1  --   CL 111 114* 113* 114*  --   CO2 22 18* 17* 17*  --   GLUCOSE 97 207* 83 211*  --   BUN 32* 28* 25* 27*  --   CREATININE 1.19 1.07 1.18 1.30*  --   CALCIUM 8.4* 8.3* 8.4* 8.7*  --   MG  --  0.9* 1.0*  --  1.1*    CBC: Recent Labs  Lab 05/27/18 0916 06/01/18 1426 06/02/18 1536  WBC 7.6 9.1 16.7*  NEUTROABS 5.9  --   --   HGB 8.4* 8.1* 9.2*  HCT 28.7* 27.5* 32.4*  MCV 90.3 90.2 91.0  PLT 153 156 245    CBG: Recent Labs  Lab 06/02/18 0604 06/02/18 1208 06/02/18 1655 06/02/18 2137 06/03/18 0627  GLUCAP 84 134* 185* 117* 114*   Family history. Positive for macular degeneration. Father with hypertension and CAD. Mother with hypertension. 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 [facility] with increasing shortness of breath and lower extremity edema. Chest x-ray and echocardiogram showed moderate LV systolic dysfunction and moderate to severe MR and moderate pulmonary systolic hypertension consistent with CHF. Patient started on BiPAP however deteriorated ended up having to be intubated placed on ventilator. Patient subsequently complications associated with worsening renal function, CHF. Attempts at extubation failed he was transferred to [facility] 04/26/2018 for ventilation wean. Initially on IV Solu-Medrol transition to by mouth prednisone after ventilation discontinued.  Tracheostomy tube not needed. Maintained on prednisone with taper. Subcutaneous heparin for DVT prophylaxis. Dysphagia #2 pudding thick liquids. Remained on aspirin as well as Cardizem for history of atrial fibrillation. Patient was admitted for a comprehensive rehabilitation program.  Hospital Course: Richard Wang was admitted to rehab 06/02/2018 for inpatient therapies to consist of PT, ST and OT at least three hours five days a week. Past admission physiatrist, therapy team and rehab RN have worked together to provide customized collaborative inpatient rehab. Pertaining to patient debility related to chronic respiratory failure. Remained on prednisone taper. Attending full therapies. Subcutaneous heparin for DVT prophylaxis. Noted atrial fibrillation with Cardizem metoprolol as dictated. Aspirin discontinued 05/20/2018 due to patient stating he was told to avoid aspirin and other anticoagulants due to GI bleed. Dysphagia #2 honey thick liquids slowly advanced to mechanical soft supplemental IV fluids later discontinued. Blood pressures controlled with hydralazine and Norvasc that was increased to 7.5 mg daily on 05/28/2018. Close monitoring of blood sugars related to steroids. Acute normocytic anemia hemoglobin 8.4. bouts of urinary frequency continued on Flomax PVR suggesting retention. Bethanechol started 05/21/2018 increased to 3 times daily on 05/22/2018. On 06/02/2018 around 2 PM noted rapid heart rate nonspecific chest pain shortness of breath and hypoxia. Oxygen saturations 8 L of oxygen. Heart rate 160s improved to 140s after dose of morphine. Troponin 0.03. Patient was  discharged to acute care services for ongoing workup of chest pain.  Physical exam. Blood pressure 110/70 pulse 80 respirations 18 oxygen saturations 94% 2 L oxygen. Constitutional. Oriented person place and time. HENT. Head.Normocephalic and atraumatic NECK. Range of motion. Tracheal deviation. No thyromegaly, Cardiovascular.  Regular rate and rhythm. No friction rub GI.soft non tender.No rebound Neurlogical.. Alert and oriented 3.Sensory decreased in stocking glove distribution up to knees.UE 3-4/5,RLE 3/5,KE and 4/5 ADF/KF,LLE 3/5 HF.   Rehab course: During patient's stay in rehab weekly team conferences were held to monitor patient's progress, set goals and discuss barriers to discharge. At admission, patient required minimum to moderate assist mobility ambulating 50 feet. Min mod assist upper body and lower body ADLs  He  has had improvement in activity tolerance, balance, postural control as well as ability to compensate for deficits. He/She has had improvement in functional use RUE/LUE  and RLE/LLE as well as improvement in awareness.performed all mobility contact guard assist however continued to require minimum to moderate assist sit to stand. Using a rolling walker. Fitted with left AFO brace. Working with energy conservation techniques. He was able to complete all transfers with use of a rolling walker and contact-guard assistance.       Disposition: dicharge to acute services   Diet:  regular  Special Instructions:no smoking   MEDS AT DISCHARGE   As per Medical service     Signed: Mcarthur Rossetti  06/03/2018, 7:24 AM

## 2018-06-03 NOTE — Progress Notes (Signed)
RT called to patient room due to patient yelling out that he could not breath.  Upon arrival, patient was on non-rebreather mask (sats of 95%) and using accessory muscles.  Auscultated patient and noted that patient had expiratory wheezing on the left side.  Rapid response RN called and came to bedside because patient had similar incident on 4/12.  MD notified.  ABG obtained and patient was placed on bipap.  One time xopenex also given.  Patient tolerating bipap well.  Sats currently 98%.  Work of breathing has improved.  Will continue to monitor.

## 2018-06-03 NOTE — Progress Notes (Signed)
Pt complained of cp 4/10, spine pain 6/10 and  said his fingers are numb. Pt stated its hard for him to catch his breath, its hard to explain but he thinks something is not right. Back and chest pain is different kind of pain.  Morphine iv and robaxin administered. Pt sating 96% in ventri mask. 12 lead EKG done, no change from previous one. Stevie Kern NP was by bedside. Aware of the situation, stated will see how next troponin will look. Made pt NPO if in case he needs to go to cath lab in am. Will continue to monitor.

## 2018-06-03 NOTE — Discharge Summary (Deleted)
  The note originally documented on this encounter has been moved the the encounter in which it belongs.  

## 2018-06-03 NOTE — Progress Notes (Signed)
Inpatient Rehabilitation Admissions Coordinator  [facility] on 3/27. D/c planned for 4/16 after family education completed. I will follow his case and assist with planning dispo as appropriate. Please order PT and OT when appropriate.  Ottie Glazier, RN, MSN Rehab Admissions Coordinator [phone] 06/03/2018 4:07 PM

## 2018-06-03 NOTE — Progress Notes (Signed)
Paged by bedside RN regarding pts troponin of 0.98. Previous troponins have been <0.03 and 0.14. Pt c/o intermittent chest pain 3/10 partially relieved by Morphine 2mg  IV. Spoke with Cardiology regarding pts rise in troponins and chest pain. They feel that rise in troponin is most likely due to pts A-Fib w RVR. VSS and pt resting at this time. We will continue to monitor.  Stevie Kern AGPCNP-BC, AGNP-C [facility] Pager [phone]

## 2018-06-03 NOTE — Progress Notes (Signed)
Called and Notified Richard Bloodgood NP about pt's troponin 0.98. HE said he will get in touch with cardiology and call me back. Pt currently resting. Will continue to monitor.

## 2018-06-03 NOTE — Progress Notes (Signed)
  Echocardiogram 2D Echocardiogram w/ definity has been performed.  Richard Wang 06/03/2018, 9:37 AM

## 2018-06-03 NOTE — Progress Notes (Signed)
PROGRESS NOTE    Richard Wang  ZOX:[id number] DOB: 1946/01/18 DOA: 06/02/2018 PCP: System, Pcp Not In   Brief Narrative:   73 y.o. male PMHx  COPD, HTN, DM2, Crohn's disease, A-fib (recurrent GI bleed while on anticoagulation discontinued), CHF (No TTE in our system), CAD, foot drop, low back pain on chronic narcotics who is in rehab after a hospitalization at [facility] for acute on chronic respiratory failure due to CHF and COPD.    He was transitioned to Rehab on no oxygen.  Today, he was intermittently SOB and around 2pm this afternoon developed a rapid heart rate, chest pain, SOB and hypoxia.  He reports that the chest pain is central, somewhat reproducible, associated with radiation down both arms and SOB.  He was somewhat gray when I entered the room at first, however, he was not diaphoretic.  He has had an MI before and he did not feel this pain was similar, but he was worried.  He further had palpitations.  He had had nitro X 2 and morphine 2mg  X 2.  The morphine helped somewhat with the pain.  He was saturating well on 8L of oxygen.  He had a HR of 160s which improved to 140s with morphine and then to 120s-130s with 2.5mg  of metoprolol IV push.  His home meds list amiodarone and carvedilol, however, currently he is on metoprolol 12.5mg  BID and Cardizem 30mg  BID.  His cardizem was increased to TID this AM as his HR was noted to be getting worse and he was started on aspirin.  He is not currently on an anticoagulant as he has had issues with severe bleeding in the past, needed frequent blood transfusions.  He would rather not be on an anticoagulant at this time, however, he is okay with aspirin.  His GI bleeding is thought to be due to his Crohn's disease.  He specifically denied fever, chills, nausea, vomiting, dysuria, change in urine.     When I evaluated the patient in the Rehab unit, he had an elevated HR, was breathing heavily and was being cared for by RRT nursing.  WBC was  16.7, up from 9.1 yesterday.  K was improved to 4.1.  Magnesium will be added on.  Initial troponin was <0.03.  EKG was reviewed and showed rapid rate, possibly some worsening heart block.  Will need to repeat once rate is improved.  CXR done showed mild pulmonary edema.     Subjective: 4/13 A/O x4, positive S OB, negative CP (can feel that he is in A. fib), negative abdominal pain   Assessment & Plan:   Active Problems:   Acute and chronic respiratory failure with hypoxia (HCC)   Acute systolic heart failure (HCC)   Atrial fibrillation (HCC)   Essential hypertension   Hypokalemia   Atrial fibrillation with RVR (HCC)   Shortness of breath   AKI (acute kidney injury) (HCC)   Steroid-dependent COPD (HCC)   Crohn's disease (HCC)   A. fib with RVR - Patient transferred here from rehab yesterday - Patient states he felt when he went into A. fib as he became short of breath. -Continue diltiazem drip -Patient had been on amiodarone but given his lung disease will not restart. -4/13 increase Metoprolol 25 mg BID -4/13 metoprolol 12.5 mg x 1 -Troponins trending up we will continue to follow: May be demand ischemia secondary to A. fib with RVR and combination of COPD exacerbation Recent Labs  Lab 06/02/18 1530 06/02/18 1912 06/03/18  0012 06/03/18 0845 06/03/18 1209  TROPONINI <0.03 0.14* 0.98* 1.24* 1.04*  -Echocardiogram complete but not read -Cardiology will see patient: Spoke with Dr. Tonny Bollman - Patient reports frequent GI bleeds over the years when on anticoagulation in the past, therefore was stopped.  Only anticoagulant patient has been recently on his aspirin. -TSH WNL  Hypokalemia - Potassium goal> 4  Hypomagnesmia - Magnesium goal> 2  Chest pain/acute respiratory failure with hypoxia /pulmonary edema -Resolving with better control of A. fib.  Patient still requiring some O2 support -However patient's CXR from 4/13 shows worsening pulmonary edema -  See CHF   Acute systolic CHF -TTE complete but reading pending -Furosemide  x 1, then 40 mg daily - Strict I/O - - Daily weight Filed Weights   06/03/18 0500  Weight: 76.4 kg   -Transfuse for hemoglobin<8  Essential HTN -See CHF  CAD - See CHF  COPD -Patient states stopped smoking when he was admitted to hospital. - Titrate O2 to maintain SPO2 89 to 92% - Xopenex QID -Solu-Medrol daily x3 doses then restart patient home dose prednisone 10 mg daily  AKI - Previous creatinine on 4/9 and 4/11 WNL - Cr up to 1.6 - Trend Recent Labs  Lab 05/30/18 0531 06/01/18 1426 06/02/18 0415 06/02/18 1536 06/03/18 0845  CREATININE 1.19 1.07 1.18 1.30* 1.20  - Lasix as above  Leukocytosis - Unclear etiology possibly secondary to chronic prednisone use. - Last 24 hours afebrile. -We will obtain blood culture and urine culture. -Continue prednisone 10 mg daily  Normocytic anemia baseline (HgB~9) - Anemia panel pending   Crohn's disease - Will restart patient's home prednisone after he obtains 3 doses of Solu-Medrol  -Currently asymptomatic  Low back pain - Currently no complaints continue current pain regimen       DVT prophylaxis: Heparin subcu Code Status: Full Family Communication: None Disposition Plan: TBD   Consultants:  Cardiology pending    Procedures/Significant Events:  4/13 echocardiogram:  LVEF = 35-40%.  -Left ventricular diastolic function could not be evaluated secondary to atrial fibrillation. -Global hypokinesis with akinesis of the mid to apical anterolateral and inferolateral myocardium.  -Moderate pleural effusion in the left lateral region.  -Trivial pericardial effusion is present. -Mitral valve regurgitation is moderate     I have personally reviewed and interpreted all radiology studies and my findings are as above.  VENTILATOR SETTINGS:    Cultures 4/13 blood pending 4/13 urine pending    Antimicrobials: Anti-infectives  (From admission, onward)   None       Devices    LINES / TUBES:      Continuous Infusions: . diltiazem (CARDIZEM) infusion 15 mg/hr (06/03/18 1226)     Objective: Vitals:   06/03/18 1318 06/03/18 1346 06/03/18 1352 06/03/18 1420  BP:   138/84 138/84  Pulse:  (!) 120 95 (!) 108  Resp: (!) 26 (!) 26 (!) 24 (!) 22  Temp:      TempSrc:      SpO2: (!) 79% 95% 97% 98%  Weight:        Intake/Output Summary (Last 24 hours) at 06/03/2018 1431 Last data filed at 06/03/2018 1314 Gross per 24 hour  Intake 291.68 ml  Output 850 ml  Net -558.32 ml   Filed Weights   06/03/18 0500  Weight: 76.4 kg    Examination:  General: A/O x4, positive  acute respiratory distress, cachectic Eyes: negative scleral hemorrhage, negative anisocoria, negative icterus ENT: Negative Runny nose, negative gingival bleeding, Neck:  Negative scars, masses, torticollis, lymphadenopathy, JVD Lungs: Clear to auscultation bilaterally without wheezes or crackles Cardiovascular: Irregularly irregular rhythm and rate, without murmur gallop or rub normal S1 and S2 Abdomen: negative abdominal pain, nondistended, positive soft, bowel sounds, no rebound, no ascites, no appreciable mass Extremities: No significant cyanosis, clubbing, or edema bilateral lower extremities Skin: Negative rashes, lesions, ulcers Psychiatric:  Negative depression, negative anxiety, negative fatigue, negative mania  Central nervous system:  Cranial nerves II through XII intact, tongue/uvula midline, all extremities muscle strength 5/5, sensation intact throughout, negative dysarthria, negative expressive aphasia, negative receptive aphasia.  .     Data Reviewed: Care during the described time interval was provided by me .  I have reviewed this patient's available data, including medical history, events of note, physical examination, and all test results as part of my evaluation.   CBC: Recent Labs  Lab 06/01/18 1426 06/02/18  1536 06/03/18 0845  WBC 9.1 16.7* 12.5*  HGB 8.1* 9.2* 9.1*  HCT 27.5* 32.4* 31.8*  MCV 90.2 91.0 90.1  PLT 156 245 197   Basic Metabolic Panel: Recent Labs  Lab 05/30/18 0531 06/01/18 1426 06/02/18 0415 06/02/18 1536 06/02/18 1912 06/03/18 0845  NA 143 141 145 144  --  144  K 3.1* 3.6 3.1* 4.1  --  3.8  CL 111 114* 113* 114*  --  113*  CO2 22 18* 17* 17*  --  20*  GLUCOSE 97 207* 83 211*  --  97  BUN 32* 28* 25* 27*  --  26*  CREATININE 1.19 1.07 1.18 1.30*  --  1.20  CALCIUM 8.4* 8.3* 8.4* 8.7*  --  9.0  MG  --  0.9* 1.0*  --  1.1* 1.8   GFR: Estimated Creatinine Clearance: 58.3 mL/min (by C-G formula based on SCr of 1.2 mg/dL). Liver Function Tests: No results for input(s): AST, ALT, ALKPHOS, BILITOT, PROT, ALBUMIN in the last 168 hours. No results for input(s): LIPASE, AMYLASE in the last 168 hours. No results for input(s): AMMONIA in the last 168 hours. Coagulation Profile: Recent Labs  Lab 06/02/18 1536  INR 1.0   Cardiac Enzymes: Recent Labs  Lab 06/01/18 1426  06/02/18 1530 06/02/18 1912 06/03/18 0012 06/03/18 0845 06/03/18 1209  CKTOTAL 20*  --   --   --   --   --   --   CKMB 1.3  --   --   --   --   --   --   TROPONINI 0.03*   < > <0.03 0.14* 0.98* 1.24* 1.04*   < > = values in this interval not displayed.   BNP (last 3 results) No results for input(s): PROBNP in the last 8760 hours. HbA1C: No results for input(s): HGBA1C in the last 72 hours. CBG: Recent Labs  Lab 06/02/18 1208 06/02/18 1655 06/02/18 2137 06/03/18 0627 06/03/18 1147  GLUCAP 134* 185* 117* 114* 93   Lipid Profile: No results for input(s): CHOL, HDL, LDLCALC, TRIG, CHOLHDL, LDLDIRECT in the last 72 hours. Thyroid Function Tests: Recent Labs    06/03/18 0012  TSH 1.411   Anemia Panel: Recent Labs    06/03/18 0845  VITAMINB12 427  FERRITIN 25  TIBC 337  IRON 25*   Urine analysis:    Component Value Date/Time   COLORURINE YELLOW 05/05/2018 1800    APPEARANCEUR CLEAR 05/05/2018 1800   LABSPEC 1.016 05/05/2018 1800   PHURINE 5.0 05/05/2018 1800   GLUCOSEU 50 (A) 05/05/2018 1800   HGBUR SMALL (A)  05/05/2018 1800   BILIRUBINUR NEGATIVE 05/05/2018 1800   KETONESUR NEGATIVE 05/05/2018 1800   PROTEINUR NEGATIVE 05/05/2018 1800   NITRITE NEGATIVE 05/05/2018 1800   LEUKOCYTESUR NEGATIVE 05/05/2018 1800   Sepsis Labs: @LABRCNTIP (procalcitonin:4,lacticidven:4)  )No results found for this or any previous visit (from the past 240 hour(s)).       Radiology Studies: Dg Chest Port 1 View  Result Date: 06/03/2018 CLINICAL DATA:  Short of breath EXAM: PORTABLE CHEST 1 VIEW COMPARISON:  06/02/2018 FINDINGS: Diffuse interstitial infiltrates and vascular congestion is are worse. More confluent opacity towards the lung bases likely due to airspace disease has developed. No pneumothorax or pleural effusion. Normal heart size. IMPRESSION: The above findings most likely represent worsening diffuse pulmonary edema with a normal heart size. An inflammatory process is not excluded. Electronically Signed   By: Jolaine ClickArthur  Hoss M.D.   On: 06/03/2018 08:01   Dg Chest Port 1 View  Result Date: 06/02/2018 CLINICAL DATA:  Chest pain. EXAM: PORTABLE CHEST 1 VIEW COMPARISON:  None. FINDINGS: The heart size and mediastinal contours are within normal limits. No pneumothorax or pleural effusion is noted. Mild bilateral interstitial densities are noted, including Kerley B lines in both lung bases, concerning for possible pulmonary edema. The visualized skeletal structures are unremarkable. IMPRESSION: Findings concerning for mild bilateral pulmonary edema. Aortic Atherosclerosis (ICD10-I70.0). Electronically Signed   By: Lupita RaiderJames  Green Jr, M.D.   On: 06/02/2018 16:06        Scheduled Meds: . aspirin EC  81 mg Oral Daily  . atorvastatin  40 mg Oral q1800  . bethanechol  10 mg Oral TID  . folic acid  1 mg Oral Daily  . [START ON 06/04/2018] furosemide  40 mg  Intravenous Daily  . heparin injection (subcutaneous)  5,000 Units Subcutaneous Q8H  . hydrALAZINE  25 mg Oral TID  . insulin glargine  7 Units Subcutaneous QHS  . levalbuterol  1.25 mg Nebulization Q6H  . loratadine  10 mg Oral Daily  . methylPREDNISolone (SOLU-MEDROL) injection  80 mg Intravenous Daily  . metoprolol tartrate  25 mg Oral BID  . pantoprazole  40 mg Oral BID  . tamsulosin  0.4 mg Oral QPC supper  . thiamine  100 mg Oral Daily   Continuous Infusions: . diltiazem (CARDIZEM) infusion 15 mg/hr (06/03/18 1226)     LOS: 1 day   The patient is critically ill with multiple organ systems failure and requires high complexity decision making for assessment and support, frequent evaluation and titration of therapies, application of advanced monitoring technologies and extensive interpretation of multiple databases. Critical Care Time devoted to patient care services described in this note  Time spent: 40 minutes     Gerhardt Gleed, Roselind MessierURTIS J, MD [facility] Pager [phone]   If 7PM-7AM, please contact night-coverage [email or web address] Password [email or web address] 06/03/2018, 2:31 PM

## 2018-06-03 NOTE — Consult Note (Addendum)
Referring Physician: Carolyne Littles, MD  Guy Sandifer is an 73 y.o. male.                       Chief Complaint: A. Fibrillation, CHF and abnormal troponin I with h/o GI bleed  HPI: 73 years old male with PMH of CHF, A. Fibrillation, COPD, HTN, Type 2 DM, CAD, Foot drop, low back pain and chronic narcotic use has acute on chronic respiratory failure due to CHF and COPD. He also has atrial fibrillation with moderate ventricular response.  Past Medical History:  Diagnosis Date  . Acute and chronic respiratory failure with hypoxia (HCC)   . Acute myocardial infarction, subendocardial infarction, subsequent episode of care (HCC)   . Acute systolic heart failure (HCC)   . Atrial fibrillation (HCC)   . Chronic atrial fibrillation   . Crohn's colitis (HCC)   . Diabetes mellitus without complication (HCC)   . Hypertension   . Sepsis with acute organ dysfunction (HCC)   . Unspecified septicemia(038.9) [facility])       Past Surgical History:  Procedure Laterality Date  . CAROTID ENDARTERECTOMY Bilateral     Family History  Problem Relation Age of Onset  . Bone cancer Brother    Social History:  reports that he has been smoking. He has been smoking about 1.50 packs per day. He has never used smokeless tobacco. He reports previous drug use. No history on file for alcohol.  Allergies:  Allergies  Allergen Reactions  . Penicillins     Medications Prior to Admission  Medication Sig Dispense Refill  . amiodarone (PACERONE) 200 MG tablet Take 200 mg by mouth daily.    Marland Kitchen amLODipine (NORVASC) 10 MG tablet Take 10 mg by mouth daily.    Marland Kitchen atorvastatin (LIPITOR) 40 MG tablet Take 40 mg by mouth.    . carvedilol (COREG) 6.25 MG tablet Take 6.25 mg by mouth 2 (two) times daily.    . clonazePAM (KLONOPIN) 0.5 MG tablet Take 0.5 mg by mouth 2 (two) times daily as needed for anxiety.    . hydrALAZINE (APRESOLINE) 50 MG tablet Take 50 mg by mouth 3 (three) times daily.    Marland Kitchen HYDROcodone-acetaminophen  (NORCO) 7.5-325 MG tablet Take 1 tablet by mouth 3 (three) times daily as needed for pain.    Marland Kitchen lisinopril (PRINIVIL,ZESTRIL) 10 MG tablet Take 10 mg by mouth daily.    . metFORMIN (GLUCOPHAGE) 1000 MG tablet Take 1,000 mg by mouth 2 (two) times daily.    . ondansetron (ZOFRAN) 4 MG tablet Take 4 mg by mouth 3 (three) times daily as needed for nausea/vomiting.    . pantoprazole (PROTONIX) 40 MG tablet Take 40 mg by mouth daily.    Marland Kitchen rOPINIRole (REQUIP) 1 MG tablet Take 2-4 mg by mouth See admin instructions. Take  in the morning as needed for restless legs and  to  one hour prior to bedtime as needed for restless legs.      Results for orders placed or performed during the hospital encounter of 06/02/18 (from the past 48 hour(s))  Glucose, capillary     Status: Abnormal   Collection Time: 06/02/18  4:55 PM  Result Value Ref Range   Glucose-Capillary 185 (H) 70 - 99 mg/dL  Magnesium     Status: Abnormal   Collection Time: 06/02/18  7:12 PM  Result Value Ref Range   Magnesium 1.1 (L) 1.7 - 2.4 mg/dL    Comment: Performed at [facility], [address], [place], [place] [address]  Troponin I - Now Then Q6H     Status: Abnormal   Collection Time: 06/02/18  7:12 PM  Result Value Ref Range   Troponin I 0.14 (HH) <0.03 ng/mL    Comment: CRITICAL RESULT CALLED TO, READ BACK BY AND VERIFIED WITH: Fransico Setters AT 2027 06/02/2018 BY L BENFIELD Performed at [facility], [address] [place], [place] [address]   Glucose, capillary     Status: Abnormal   Collection Time: 06/02/18  9:37 PM  Result Value Ref Range   Glucose-Capillary 117 (H) 70 - 99 mg/dL  TSH     Status: None   Collection Time: 06/03/18 12:12 AM  Result Value Ref Range   TSH 1.411 0.350 - 4.500 uIU/mL    Comment: Performed by a 3rd Generation assay with a functional sensitivity of <=0.01 uIU/mL. Performed at [facility], [address], [place], [place] [address]   Troponin I - Now Then Q6H      Status: Abnormal   Collection Time: 06/03/18 12:12 AM  Result Value Ref Range   Troponin I 0.98 (HH) <0.03 ng/mL    Comment: CRITICAL RESULT CALLED TO, READ BACK BY AND VERIFIED WITH: Truett Perna [facility] 06/03/18 0129 WAYK Performed at [facility], [address], [place], [place] [address]   Glucose, capillary     Status: Abnormal   Collection Time: 06/03/18  6:27 AM  Result Value Ref Range   Glucose-Capillary 114 (H) 70 - 99 mg/dL  Basic metabolic panel     Status: Abnormal   Collection Time: 06/03/18  8:45 AM  Result Value Ref Range   Sodium 144 135 - 145 mmol/L   Potassium 3.8 3.5 - 5.1 mmol/L   Chloride 113 (H) 98 - 111 mmol/L   CO2 20 (L) 22 - 32 mmol/L   Glucose, Bld 97 70 - 99 mg/dL   BUN 26 (H) 8 - 23 mg/dL   Creatinine, Ser 2.95 0.61 - 1.24 mg/dL   Calcium 9.0 8.9 - 62.1 mg/dL   GFR calc non Af Amer >60 >60 mL/min   GFR calc Af Amer >60 >60 mL/min   Anion gap 11 5 - 15    Comment: Performed at [facility], [address], [place], [place] [address]  CBC     Status: Abnormal   Collection Time: 06/03/18  8:45 AM  Result Value Ref Range   WBC 12.5 (H) 4.0 - 10.5 K/uL   RBC 3.53 (L) 4.22 - 5.81 MIL/uL   Hemoglobin 9.1 (L) 13.0 - 17.0 g/dL   HCT 78.4 (L) 69.6 - 29.5 %   MCV 90.1 80.0 - 100.0 fL   MCH 25.8 (L) 26.0 - 34.0 pg   MCHC 28.6 (L) 30.0 - 36.0 g/dL   RDW 28.4 (H) 13.2 - 44.0 %   Platelets 197 150 - 400 K/uL   nRBC 0.0 0.0 - 0.2 %    Comment: Performed at [facility], [address], [place], [place] [address]  Troponin I - Now Then Q6H     Status: Abnormal   Collection Time: 06/03/18  8:45 AM  Result Value Ref Range   Troponin I 1.24 (HH) <0.03 ng/mL    Comment: CRITICAL VALUE NOTED.  VALUE IS CONSISTENT WITH PREVIOUSLY REPORTED AND CALLED VALUE. Performed at [facility], [address] [place], [place] [address]   Vitamin B12  Status: None   Collection Time: 06/03/18  8:45 AM  Result Value Ref Range   Vitamin B-12 427 180 -  914 pg/mL    Comment: (NOTE) This assay is not validated for testing neonatal or myeloproliferative syndrome specimens for Vitamin B12 levels. Performed at [facility], [address] [place], [place] [address]   Iron and TIBC     Status: Abnormal   Collection Time: 06/03/18  8:45 AM  Result Value Ref Range   Iron 25 (L) 45 - 182 ug/dL   TIBC 248 185 - 909 ug/dL   Saturation Ratios 7 (L) 17.9 - 39.5 %   UIBC 312 ug/dL    Comment: Performed at [facility], [address], [place], [place] [address]  Ferritin     Status: None   Collection Time: 06/03/18  8:45 AM  Result Value Ref Range   Ferritin 25 24 - 336 ng/mL    Comment: Performed at [facility], [address] [place], [place] [address]  Magnesium     Status: None   Collection Time: 06/03/18  8:45 AM  Result Value Ref Range   Magnesium 1.8 1.7 - 2.4 mg/dL    Comment: Performed at [facility], [address], [place], [place] [address] CORRECTED ON 04/13 AT 1035: PREVIOUSLY REPORTED AS 1.9   Glucose, capillary     Status: None   Collection Time: 06/03/18 11:47 AM  Result Value Ref Range   Glucose-Capillary 93 70 - 99 mg/dL  Troponin I - Now Then Q6H     Status: Abnormal   Collection Time: 06/03/18 12:09 PM  Result Value Ref Range   Troponin I 1.04 (HH) <0.03 ng/mL    Comment: CRITICAL VALUE NOTED.  VALUE IS CONSISTENT WITH PREVIOUSLY REPORTED AND CALLED VALUE. Performed at [facility], [address] [place], [place] [address]   Blood gas, arterial     Status: None   Collection Time: 06/03/18  2:20 PM  Result Value Ref Range   FIO2 1.00    Delivery systems OXYGEN MASK    pH, Arterial 7.428 7.350 - 7.450   pCO2 arterial 35.0 32.0 - 48.0 mmHg   pO2, Arterial 86.7 83.0 - 108.0 mmHg   Bicarbonate 22.7 20.0 - 28.0 mmol/L   Acid-base deficit 1.0 0.0 - 2.0 mmol/L   O2 Saturation 96.9 %   Patient temperature 98.6    Collection site RIGHT RADIAL    Drawn by [id number]    Sample type  ARTERIAL DRAW    Allens test (pass/fail) PASS PASS   Dg Chest Port 1 View  Result Date: 06/03/2018 CLINICAL DATA:  Respiratory distress. EXAM: PORTABLE CHEST 1 VIEW 2:12 p.m. COMPARISON:  06/03/2018 at 6:14 a.m. and 06/02/2018 FINDINGS: The heart size and pulmonary vascularity are within normal limits. Tortuosity and calcification of the thoracic aorta. Hazy bilateral pulmonary infiltrates primarily in the lower lobes with increased consolidation at the left lung base since the prior study. No definable effusions. Faint Kerley B-lines at the lung bases. IMPRESSION: Findings are consistent with bilateral pulmonary edema, slightly progressed at the left lung base. Aortic Atherosclerosis (ICD10-I70.0). Electronically Signed   By: Francene Boyers M.D.   On: 06/03/2018 14:36   Dg Chest Port 1 View  Result Date: 06/03/2018 CLINICAL DATA:  Short of breath EXAM: PORTABLE CHEST 1 VIEW COMPARISON:  06/02/2018 FINDINGS: Diffuse interstitial infiltrates and vascular congestion is are worse. More confluent opacity towards the lung bases likely  due to airspace disease has developed. No pneumothorax or pleural effusion. Normal heart size. IMPRESSION: The above findings most likely represent worsening diffuse pulmonary edema with a normal heart size. An inflammatory process is not excluded. Electronically Signed   By: Jolaine ClickArthur  Hoss M.D.   On: 06/03/2018 08:01   Dg Chest Port 1 View  Result Date: 06/02/2018 CLINICAL DATA:  Chest pain. EXAM: PORTABLE CHEST 1 VIEW COMPARISON:  None. FINDINGS: The heart size and mediastinal contours are within normal limits. No pneumothorax or pleural effusion is noted. Mild bilateral interstitial densities are noted, including Kerley B lines in both lung bases, concerning for possible pulmonary edema. The visualized skeletal structures are unremarkable. IMPRESSION: Findings concerning for mild bilateral pulmonary edema. Aortic Atherosclerosis (ICD10-I70.0). Electronically Signed   By: Lupita RaiderJames   Green Jr, M.D.   On: 06/02/2018 16:06    Review Of Systems Constitutional: No fever, chills, weight loss or gain. Eyes: No vision change, wears glasses. No discharge or pain. Ears: No hearing loss, No tinnitus. Respiratory: No asthma, positive COPD, pneumonias, shortness of breath. No hemoptysis. Cardiovascular: Positive chest pain, palpitation, no leg edema. Gastrointestinal: No nausea, vomiting, diarrhea, constipation. No GI bleed. No hepatitis. Genitourinary: No dysuria, hematuria, kidney stone. No incontinance. Neurological: No headache, stroke, seizures.  Psychiatry: No psych facility admission for anxiety, depression, suicide. No detox. Skin: No rash. Musculoskeletal: Positive joint pain, fibromyalgia. No neck pain, back pain. Lymphadenopathy: No lymphadenopathy. Hematology: No anemia or easy bruising.   Blood pressure 138/84, pulse (!) 108, temperature 97.6 F (36.4 C), temperature source Oral, resp. rate (!) 22, weight 76.4 kg, SpO2 98 %. Body mass index is 24.17 kg/m. General appearance: alert, cooperative, appears stated age and moderate respiratory distress Head: Normocephalic, atraumatic. Eyes: pink conjunctiva, corneas clear. PERRL, EOM's intact. Neck: No adenopathy, no carotid bruit, no JVD, supple, symmetrical, trachea midline and thyroid not enlarged. Resp: Crackles at bases to auscultation bilaterally. Cardio: Irregular rate and rhythm, S1, S2 normal, III/VI systolic murmur, no click, rub or gallop GI: Soft, non-tender; bowel sounds normal; no organomegaly. Extremities: No edema, cyanosis or clubbing. Skin: Warm and dry.  Neurologic: Alert and oriented X 2, normal strength.  Assessment/Plan Acute on chronic respiratory failure with hypoxemia Acute on chronic left systolic heart failure NSTEMI COPD Anemia, iron deficiency and h/o GI bleed Chronic atrial fibrillation, CHA2DS2VASc score of 3 Crohn's Colitis  Resume amiodarone for heart rate control and  myocardial ischemia Offered cardiac interventions, patient prefers medical therapy due to h/o GI bleed.  Ricki RodriguezAjay S Itzamara Casas, MD  06/03/2018, 4:04 PM

## 2018-06-03 NOTE — Progress Notes (Signed)
Social Work  Discharge Note  The overall goal for the admission was met for:   Discharge location: No - pt transferred to acute due to medical issues (d/c plan was for pt to d/c home with sister and her family providing 24/7 supervision)  Length of Stay: No - to acute (CIR targeted d/c date was 4/16)  Discharge activity level: No - pt had not yet met his supervision goals and family ed had not yet been completed.  Home/community participation: No  Services provided included: MD, RD, PT, OT, RN, TR, Pharmacy and SW  Financial Services: Medicare  Follow-up services arranged:  NA - transferred back to acute care  Comments (or additional information):  Patient/Family verbalized understanding of follow-up arrangements: NA  Individual responsible for coordination of the follow-up plan: pt  Confirmed correct DME delivered:  NA - transferred to acute    Charly Hunton

## 2018-06-04 DIAGNOSIS — R0603 Acute respiratory distress: Secondary | ICD-10-CM

## 2018-06-04 LAB — BASIC METABOLIC PANEL
Anion gap: 13 (ref 5–15)
BUN: 26 mg/dL — ABNORMAL HIGH (ref 8–23)
CO2: 20 mmol/L — ABNORMAL LOW (ref 22–32)
Calcium: 9.1 mg/dL (ref 8.9–10.3)
Chloride: 106 mmol/L (ref 98–111)
Creatinine, Ser: 1.36 mg/dL — ABNORMAL HIGH (ref 0.61–1.24)
GFR calc Af Amer: >60 mL/min (ref >60)
GFR calc non Af Amer: 52 mL/min — ABNORMAL LOW (ref >60)
Glucose, Bld: 241 mg/dL — ABNORMAL HIGH (ref 70–99)
Potassium: 4.5 mmol/L (ref 3.5–5.1)
Sodium: 139 mmol/L (ref 135–145)

## 2018-06-04 LAB — MAGNESIUM: Magnesium: 1.6 mg/dL — ABNORMAL LOW (ref 1.7–2.4)

## 2018-06-04 LAB — GLUCOSE, CAPILLARY
Glucose-Capillary: 252 mg/dL — ABNORMAL HIGH (ref 70–99)
Glucose-Capillary: 236 mg/dL — ABNORMAL HIGH (ref 70–99)
Glucose-Capillary: 181 mg/dL — ABNORMAL HIGH (ref 70–99)
Glucose-Capillary: 209 mg/dL — ABNORMAL HIGH (ref 70–99)

## 2018-06-04 LAB — TROPONIN I: Troponin I: 0.52 ng/mL (ref <0.03)

## 2018-06-04 MED ORDER — MAGNESIUM SULFATE 2 GM/50ML IV SOLN
2.0000 g | Freq: Once | INTRAVENOUS | Status: AC
  Administered 2018-06-04: 10:00:00 2 g via INTRAVENOUS
  Filled 2018-06-04: qty 50

## 2018-06-04 MED ORDER — LEVALBUTEROL HCL 1.25 MG/0.5ML IN NEBU
1.2500 mg | INHALATION_SOLUTION | Freq: Three times a day (TID) | RESPIRATORY_TRACT | Status: DC
  Administered 2018-06-04 – 2018-06-06 (×5): 1.25 mg via RESPIRATORY_TRACT
  Filled 2018-06-04 (×5): qty 0.5

## 2018-06-04 MED ORDER — INSULIN ASPART 100 UNIT/ML ~~LOC~~ SOLN
0.0000 [IU] | Freq: Three times a day (TID) | SUBCUTANEOUS | Status: DC
  Administered 2018-06-05 (×2): 3 [IU] via SUBCUTANEOUS
  Administered 2018-06-05: 17:00:00 5 [IU] via SUBCUTANEOUS
  Administered 2018-06-06 (×2): 3 [IU] via SUBCUTANEOUS

## 2018-06-04 MED ORDER — INSULIN ASPART 100 UNIT/ML ~~LOC~~ SOLN
0.0000 [IU] | SUBCUTANEOUS | Status: DC
  Administered 2018-06-04: 18:00:00 3 [IU] via SUBCUTANEOUS
  Administered 2018-06-04: 13:00:00 5 [IU] via SUBCUTANEOUS

## 2018-06-04 MED ORDER — PREDNISONE 20 MG PO TABS
60.0000 mg | ORAL_TABLET | Freq: Every day | ORAL | Status: DC
  Administered 2018-06-05 – 2018-06-06 (×2): 60 mg via ORAL
  Filled 2018-06-04 (×2): qty 3

## 2018-06-04 NOTE — Progress Notes (Signed)
Pt in no distress requiring bipap at this time. Bipap on standby if needed. RT will monitor.

## 2018-06-04 NOTE — Consult Note (Signed)
Ref: System, Pcp Not In   Subjective:  Awake. Breathing is improving. He is upset over his Crohn's Colitis acting up and waiting for medication. Troponin I are trending down. Hgb 9.1. Heart rate in 100 to 110 bpm.  Objective:  Vital Signs in the last 24 hours: Temp:  [97.6 F (36.4 C)-99 F (37.2 C)] 97.6 F (36.4 C) (04/14 1008) Pulse Rate:  [62-107] 102 (04/14 1200) Cardiac Rhythm: Atrial fibrillation;Bundle branch block (04/14 1905) Resp:  [16-21] 16 (04/14 1200) BP: (107-142)/(66-87) 107/76 (04/14 1600) SpO2:  [94 %-100 %] 94 % (04/14 1200) Weight:  [76.2 kg] 76.2 kg (04/14 0400)  Physical Exam: BP Readings from Last 1 Encounters:  06/04/18 107/76     Wt Readings from Last 1 Encounters:  06/04/18 76.2 kg    Weight change: -0.2 kg Body mass index is 24.1 kg/m. HEENT: /AT, Eyes-Conjunctiva-Pink, Sclera-Non-icteric Neck: No JVD, No bruit, Trachea midline. Lungs:  Clearing, Bilateral. Cardiac:  Irregular rhythm, normal S1 and S2, no S3. II/VI systolic murmur. Abdomen:  Soft, non-tender. BS present. Extremities:  No edema present. No cyanosis. No clubbing. CNS: AxOx2, Cranial nerves grossly intact, moves all 4 extremities.  Skin: Warm and dry.   Intake/Output from previous day: 04/13 0701 - 04/14 0700 In: 170 [I.V.:170] Out: 1500 [Urine:1500]    Lab Results: BMET    Component Value Date/Time   NA 139 06/04/2018 0015   NA 144 06/03/2018 0845   NA 144 06/02/2018 1536   K 4.5 06/04/2018 0015   K 3.8 06/03/2018 0845   K 4.1 06/02/2018 1536   CL 106 06/04/2018 0015   CL 113 (H) 06/03/2018 0845   CL 114 (H) 06/02/2018 1536   CO2 20 (L) 06/04/2018 0015   CO2 20 (L) 06/03/2018 0845   CO2 17 (L) 06/02/2018 1536   GLUCOSE 241 (H) 06/04/2018 0015   GLUCOSE 97 06/03/2018 0845   GLUCOSE 211 (H) 06/02/2018 1536   BUN 26 (H) 06/04/2018 0015   BUN 26 (H) 06/03/2018 0845   BUN 27 (H) 06/02/2018 1536   CREATININE 1.36 (H) 06/04/2018 0015   CREATININE 1.20  06/03/2018 0845   CREATININE 1.30 (H) 06/02/2018 1536   CALCIUM 9.1 06/04/2018 0015   CALCIUM 9.0 06/03/2018 0845   CALCIUM 8.7 (L) 06/02/2018 1536   GFRNONAA 52 (L) 06/04/2018 0015   GFRNONAA >60 06/03/2018 0845   GFRNONAA 55 (L) 06/02/2018 1536   GFRAA >60 06/04/2018 0015   GFRAA >60 06/03/2018 0845   GFRAA >60 06/02/2018 1536   CBC    Component Value Date/Time   WBC 12.5 (H) 06/03/2018 0845   RBC 3.53 (L) 06/03/2018 0845   HGB 9.1 (L) 06/03/2018 0845   HCT 31.8 (L) 06/03/2018 0845   PLT 197 06/03/2018 0845   MCV 90.1 06/03/2018 0845   MCH 25.8 (L) 06/03/2018 0845   MCHC 28.6 (L) 06/03/2018 0845   RDW 17.0 (H) 06/03/2018 0845   LYMPHSABS 1.2 05/27/2018 0916   MONOABS 0.4 05/27/2018 0916   EOSABS 0.0 05/27/2018 0916   BASOSABS 0.0 05/27/2018 0916   HEPATIC Function Panel Recent Labs    04/27/18 0124 05/20/18 0554  PROT 5.3* 5.9*   HEMOGLOBIN A1C No components found for: HGA1C,  MPG CARDIAC ENZYMES Lab Results  Component Value Date   CKTOTAL 20 (L) 06/01/2018   CKMB 1.3 06/01/2018   TROPONINI 0.52 (HH) 06/03/2018   TROPONINI 0.72 (HH) 06/03/2018   TROPONINI 1.04 (HH) 06/03/2018   BNP No results for input(s): PROBNP in the  last 8760 hours. TSH Recent Labs    04/28/18 1052 06/03/18 0012  TSH 0.109* 1.411   CHOLESTEROL No results for input(s): CHOL in the last 8760 hours.  Scheduled Meds: . amiodarone  200 mg Oral Daily  . aspirin EC  81 mg Oral Daily  . atorvastatin  40 mg Oral q1800  . bethanechol  10 mg Oral TID  . folic acid  1 mg Oral Daily  . furosemide  40 mg Intravenous Daily  . heparin injection (subcutaneous)  5,000 Units Subcutaneous Q8H  . hydrALAZINE  25 mg Oral TID  . insulin aspart  0-15 Units Subcutaneous Q4H  . insulin glargine  7 Units Subcutaneous QHS  . levalbuterol  1.25 mg Nebulization TID  . loratadine  10 mg Oral Daily  . metoprolol tartrate  25 mg Oral BID  . pantoprazole  40 mg Oral BID  . [START ON 06/05/2018] predniSONE   60 mg Oral Q breakfast  . tamsulosin  0.4 mg Oral QPC supper  . thiamine  100 mg Oral Daily   Continuous Infusions: . diltiazem (CARDIZEM) infusion 15 mg/hr (06/04/18 1707)   PRN Meds:.acetaminophen, albuterol, HYDROcodone-acetaminophen, methocarbamol, morphine injection, nitroGLYCERIN, ondansetron (ZOFRAN) IV  Assessment/Plan: Acute on chronic respiratory failure with hypoxemia and hypercarbia Acute on chronic left systolic heart failure NSTEMI COPD Iron deficiency and blood loss anemia Chronic atrial fibrillation Crohn's Colitis  Continue amiodarone and medical therapy for CHF, NSTEMI and atrial fibrillation. May hold lasix if clinically dry.. Increase activity as tolerated. GI consult if needed   LOS: 2 days    Orpah Cobb  MD  06/04/2018, 7:42 PM

## 2018-06-04 NOTE — Progress Notes (Signed)
Inpatient Rehabilitation Admissions Coordinator  I met with patient at bedside. He states he feels less SOB today. I await PT and OT assessment and medical readiness for possible readmission to inpt rehab to complete his rehab course if needed. Pt is open to that discussion. I will follow.  Danne Baxter, RN, MSN Rehab Admissions Coordinator [phone] 06/04/2018 1:52 PM

## 2018-06-04 NOTE — Progress Notes (Addendum)
Inpatient Diabetes Program Recommendations  AACE/ADA: New Consensus Statement on Inpatient Glycemic Control (2015)  Target Ranges:  Prepandial:   less than 140 mg/dL      Peak postprandial:   less than 180 mg/dL (1-2 hours)      Critically ill patients:  140 - 180 mg/dL   Results for VARIAN, WIND (MRN [id number]) as of 06/04/2018 10:23  Ref. Range 06/03/2018 06:27 06/03/2018 11:47 06/03/2018 16:08 06/03/2018 21:37 06/04/2018 06:21  Glucose-Capillary Latest Ref Range: 70 - 99 mg/dL 166 (H) 93 063 (H) 016 (H) 252 (H)    Review of Glycemic Control  Diabetes history: DM 2 Outpatient Diabetes medications: Metformin 1000 mg BID Current orders for Inpatient glycemic control: Lantus 7 units qhs, Novolog 0-15 units Q4 hours  Inpatient Diabetes Program Recommendations:    Patient was on PO prednisone 10 mg but was placed on IV Solumedrol after increased SOB. Glucose trends this am was 252.   Consider increasing Lantus to 10-12 units units (Will have to titrate back down after steroids are decreased). Consider giving additional dose this am. May also consider Novolog meal coverage if postprandial glucose trends increase while on increased doses of steroids.  Thanks, Christena Deem RN, MSN, BC-ADM Inpatient Diabetes Coordinator Team Pager [phone] (8a-5p)

## 2018-06-04 NOTE — Progress Notes (Signed)
PROGRESS NOTE    Richard Wang  WUJ:[id number] DOB: 02-09-1946 DOA: 06/02/2018 PCP: System, Pcp Not In   Brief Narrative:   73 y.o. male PMHx  COPD, HTN, DM2, Crohn's disease, A-fib (recurrent GI bleed while on anticoagulation discontinued), CHF (No TTE in our system), CAD, foot drop, low back pain on chronic narcotics who is in rehab after a hospitalization at [facility] for acute on chronic respiratory failure due to CHF and COPD.    He was transitioned to Rehab on no oxygen.  Today, he was intermittently SOB and around 2pm this afternoon developed a rapid heart rate, chest pain, SOB and hypoxia.  He reports that the chest pain is central, somewhat reproducible, associated with radiation down both arms and SOB.  He was somewhat gray when I entered the room at first, however, he was not diaphoretic.  He has had an MI before and he did not feel this pain was similar, but he was worried.  He further had palpitations.  He had had nitro X 2 and morphine  X 2.  The morphine helped somewhat with the pain.  He was saturating well on 8L of oxygen.  He had a HR of 160s which improved to 140s with morphine and then to 120s-130s with 2.5mg  of metoprolol IV push.  His home meds list amiodarone and carvedilol, however, currently he is on metoprolol 12.5mg  BID and Cardizem  BID.  His cardizem was increased to TID this AM as his HR was noted to be getting worse and he was started on aspirin.  He is not currently on an anticoagulant as he has had issues with severe bleeding in the past, needed frequent blood transfusions.  He would rather not be on an anticoagulant at this time, however, he is okay with aspirin.  His GI bleeding is thought to be due to his Crohn's disease.  He specifically denied fever, chills, nausea, vomiting, dysuria, change in urine.     When I evaluated the patient in the Rehab unit, he had an elevated HR, was breathing heavily and was being cared for by RRT nursing.  WBC was  16.7, up from 9.1 yesterday.  K was improved to 4.1.  Magnesium will be added on.  Initial troponin was <0.03.  EKG was reviewed and showed rapid rate, possibly some worsening heart block.  Will need to repeat once rate is improved.  CXR done showed mild pulmonary edema.     Subjective: 4/14 A/O x4, positive S OB (significantly improved), negative CP (can feel he is in A. fib), positive abdominal pain started this morning consistent with his typical Crohn's flare.  States missed his Remicade dose 2 weeks ago secondary to being in rehab.  Cannot remember the dose of Remicade however says obtained from a Dr. Avanell Shackleton oncology.   Assessment & Plan:   Active Problems:   Acute and chronic respiratory failure with hypoxia (HCC)   Acute systolic heart failure (HCC)   Atrial fibrillation (HCC)   Essential hypertension   Hypokalemia   Atrial fibrillation with RVR (HCC)   Shortness of breath   AKI (acute kidney injury) (HCC)   Steroid-dependent COPD (HCC)   Crohn's disease (HCC)   Acute systolic CHF -4/13 echocardiogram EF 35 to 40%: See results below -4/14 restart amiodarone 200 mg daily per cardiology. -Furosemide 40 mg daily -Hydralazine 25 mg 3 times daily -Metoprolol 25 mg twice daily - Strict I/O -1.4 L - Daily weight Filed Weights   06/03/18  0500 06/04/18 0400  Weight: 76.4 kg 76.2 kg   -Transfuse for hemoglobin<8  Essential HTN -See CHF  CAD - See CHF  A. fib with RVR - Patient transferred here from rehab yesterday - Patient states he felt when he went into A. fib as he became short of breath. -See CHF -Troponins trending up we will continue to follow: May be demand ischemia secondary to A. fib with RVR and combination of COPD exacerbation.  Beginning to trend down Recent Labs  Lab 06/03/18 0012 06/03/18 0845 06/03/18 1209 06/03/18 1837 06/03/18 2331  TROPONINI 0.98* 1.24* 1.04* 0.72* 0.52*  -Cardiology will see patient: Spoke with Dr. Tonny Bollman -  Patient reports frequent GI bleeds over the years when on anticoagulation in the past, therefore was stopped.  Only anticoagulant patient has been recently on his aspirin. -TSH WNL  Hypokalemia - Potassium goal> 4  Hypomagnesemia - Magnesium goal> 2 - Magnesium IV 2 g  Chest pain/acute respiratory failure with hypoxia /pulmonary edema -Patient O2 demand significantly decreased today. - See CHF  COPD -Patient states stopped smoking when he was admitted to hospital. - Titrate O2 to maintain SPO2 89 to 92% - Xopenex QID  AKI - Previous creatinine on 4/9 and 4/11 WNL - Cr up to 1.6 - Trend Recent Labs  Lab 06/01/18 1426 06/02/18 0415 06/02/18 1536 06/03/18 0845 06/04/18 0015  CREATININE 1.07 1.18 1.30* 1.20 1.36*  - Lasix as above  Leukocytosis - Unclear etiology possibly secondary to chronic prednisone use. - Last 24 hours afebrile. -We will obtain blood culture and urine culture.  Normocytic anemia baseline (HgB~9) - Anemia panel pending   Crohn's disease -4/14 patient with flare DC Solu-Medrol -Prednisone 60 mg daily -On 4/15 contact niece concerning phone number for Dr. Benay Pike oncology.  Obtain dose of Remicade  Low back pain - Currently no complaints continue current pain regimen   Hyperglycemia -Secondary to prednisone -4/14 start moderate SSI  Goals of care -4/14 PT/OT consult: Evaluate for return to CIR    DVT prophylaxis: Heparin subcu Code Status: Full Family Communication: None Disposition Plan: TBD   Consultants:  Cardiology pending    Procedures/Significant Events:  4/13 echocardiogram:  LVEF = 35-40%.  -Left ventricular diastolic function could not be evaluated secondary to atrial fibrillation. -Global hypokinesis with akinesis of the mid to apical anterolateral and inferolateral myocardium.  -Moderate pleural effusion in the left lateral region.  -Trivial pericardial effusion is present. -Mitral valve regurgitation is  moderate     I have personally reviewed and interpreted all radiology studies and my findings are as above.  VENTILATOR SETTINGS:    Cultures 4/13 blood LEFT hand NGTD 4/13 blood RIGHT hand NGTD 4/13 urine pending    Antimicrobials: Anti-infectives (From admission, onward)   None       Devices    LINES / TUBES:      Continuous Infusions: . diltiazem (CARDIZEM) infusion 15 mg/hr (06/04/18 0218)     Objective: Vitals:   06/04/18 0034 06/04/18 0110 06/04/18 0400 06/04/18 0822  BP: 109/66  124/85 129/75  Pulse: 81  95 (!) 107  Resp:   16   Temp: 99 F (37.2 C)  98.9 F (37.2 C)   TempSrc: Oral  Oral   SpO2:  100% 96%   Weight:   76.2 kg     Intake/Output Summary (Last 24 hours) at 06/04/2018 0848 Last data filed at 06/04/2018 0645 Gross per 24 hour  Intake 170 ml  Output 1500 ml  Net -1330 ml   Filed Weights   06/03/18 0500 06/04/18 0400  Weight: 76.4 kg 76.2 kg   General: A/O x4 positive acute respiratory distress (but significantly improved) Eyes: negative scleral hemorrhage, negative anisocoria, negative icterus ENT: Negative Runny nose, negative gingival bleeding, Neck:  Negative scars, masses, torticollis, lymphadenopathy, JVD Lungs: Clear to auscultation bilaterally upper lobes, bilateral lower lobes decreased breath sounds, crackles on the left base Cardiovascular: Irregular irregular rhythm and rate, without murmur gallop or rub normal S1 and S2 Abdomen: Positive abdominal painLUQ>>LLQ, nondistended, positive soft, bowel sounds, no rebound, no ascites, no appreciable mass Extremities: No significant cyanosis, clubbing, or edema bilateral lower extremities Skin: Negative rashes, lesions, ulcers Psychiatric:  Negative depression, negative anxiety, negative fatigue, negative mania  Central nervous system:  Cranial nerves II through XII intact, tongue/uvula midline, all extremities muscle strength 5/5, sensation intact throughout, negative  dysarthria, negative expressive aphasia, negative receptive aphasia.     .     Data Reviewed: Care during the described time interval was provided by me .  I have reviewed this patient's available data, including medical history, events of note, physical examination, and all test results as part of my evaluation.   CBC: Recent Labs  Lab 06/01/18 1426 06/02/18 1536 06/03/18 0845  WBC 9.1 16.7* 12.5*  HGB 8.1* 9.2* 9.1*  HCT 27.5* 32.4* 31.8*  MCV 90.2 91.0 90.1  PLT 156 245 197   Basic Metabolic Panel: Recent Labs  Lab 06/01/18 1426 06/02/18 0415 06/02/18 1536 06/02/18 1912 06/03/18 0845 06/04/18 0015  NA 141 145 144  --  144 139  K 3.6 3.1* 4.1  --  3.8 4.5  CL 114* 113* 114*  --  113* 106  CO2 18* 17* 17*  --  20* 20*  GLUCOSE 207* 83 211*  --  97 241*  BUN 28* 25* 27*  --  26* 26*  CREATININE 1.07 1.18 1.30*  --  1.20 1.36*  CALCIUM 8.3* 8.4* 8.7*  --  9.0 9.1  MG 0.9* 1.0*  --  1.1* 1.8 1.6*   GFR: Estimated Creatinine Clearance: 51.4 mL/min (A) (by C-G formula based on SCr of 1.36 mg/dL (H)). Liver Function Tests: No results for input(s): AST, ALT, ALKPHOS, BILITOT, PROT, ALBUMIN in the last 168 hours. No results for input(s): LIPASE, AMYLASE in the last 168 hours. No results for input(s): AMMONIA in the last 168 hours. Coagulation Profile: Recent Labs  Lab 06/02/18 1536  INR 1.0   Cardiac Enzymes: Recent Labs  Lab 06/01/18 1426  06/03/18 0012 06/03/18 0845 06/03/18 1209 06/03/18 1837 06/03/18 2331  CKTOTAL 20*  --   --   --   --   --   --   CKMB 1.3  --   --   --   --   --   --   TROPONINI 0.03*   < > 0.98* 1.24* 1.04* 0.72* 0.52*   < > = values in this interval not displayed.   BNP (last 3 results) No results for input(s): PROBNP in the last 8760 hours. HbA1C: No results for input(s): HGBA1C in the last 72 hours. CBG: Recent Labs  Lab 06/03/18 0627 06/03/18 1147 06/03/18 1608 06/03/18 2137 06/04/18 0621  GLUCAP 114* 93 195* 251*  252*   Lipid Profile: No results for input(s): CHOL, HDL, LDLCALC, TRIG, CHOLHDL, LDLDIRECT in the last 72 hours. Thyroid Function Tests: Recent Labs    06/03/18 0012  TSH 1.411   Anemia Panel: Recent Labs  06/03/18 0845  VITAMINB12 427  FERRITIN 25  TIBC 337  IRON 25*   Urine analysis:    Component Value Date/Time   COLORURINE YELLOW 06/03/2018 1621   APPEARANCEUR HAZY (A) 06/03/2018 1621   LABSPEC 1.008 06/03/2018 1621   PHURINE 5.0 06/03/2018 1621   GLUCOSEU NEGATIVE 06/03/2018 1621   HGBUR NEGATIVE 06/03/2018 1621   BILIRUBINUR NEGATIVE 06/03/2018 1621   KETONESUR NEGATIVE 06/03/2018 1621   PROTEINUR NEGATIVE 06/03/2018 1621   NITRITE POSITIVE (A) 06/03/2018 1621   LEUKOCYTESUR MODERATE (A) 06/03/2018 1621   Sepsis Labs: @LABRCNTIP (procalcitonin:4,lacticidven:4)  )No results found for this or any previous visit (from the past 240 hour(s)).       Radiology Studies: Dg Chest Port 1 View  Result Date: 06/03/2018 CLINICAL DATA:  Respiratory distress. EXAM: PORTABLE CHEST 1 VIEW 2:12 p.m. COMPARISON:  06/03/2018 at 6:14 a.m. and 06/02/2018 FINDINGS: The heart size and pulmonary vascularity are within normal limits. Tortuosity and calcification of the thoracic aorta. Hazy bilateral pulmonary infiltrates primarily in the lower lobes with increased consolidation at the left lung base since the prior study. No definable effusions. Faint Kerley B-lines at the lung bases. IMPRESSION: Findings are consistent with bilateral pulmonary edema, slightly progressed at the left lung base. Aortic Atherosclerosis (ICD10-I70.0). Electronically Signed   By: Francene Boyers M.D.   On: 06/03/2018 14:36   Dg Chest Port 1 View  Result Date: 06/03/2018 CLINICAL DATA:  Short of breath EXAM: PORTABLE CHEST 1 VIEW COMPARISON:  06/02/2018 FINDINGS: Diffuse interstitial infiltrates and vascular congestion is are worse. More confluent opacity towards the lung bases likely due to airspace disease  has developed. No pneumothorax or pleural effusion. Normal heart size. IMPRESSION: The above findings most likely represent worsening diffuse pulmonary edema with a normal heart size. An inflammatory process is not excluded. Electronically Signed   By: Jolaine Click M.D.   On: 06/03/2018 08:01   Dg Chest Port 1 View  Result Date: 06/02/2018 CLINICAL DATA:  Chest pain. EXAM: PORTABLE CHEST 1 VIEW COMPARISON:  None. FINDINGS: The heart size and mediastinal contours are within normal limits. No pneumothorax or pleural effusion is noted. Mild bilateral interstitial densities are noted, including Kerley B lines in both lung bases, concerning for possible pulmonary edema. The visualized skeletal structures are unremarkable. IMPRESSION: Findings concerning for mild bilateral pulmonary edema. Aortic Atherosclerosis (ICD10-I70.0). Electronically Signed   By: Lupita Raider, M.D.   On: 06/02/2018 16:06        Scheduled Meds: . amiodarone  200 mg Oral Daily  . aspirin EC  81 mg Oral Daily  . atorvastatin  40 mg Oral q1800  . bethanechol  10 mg Oral TID  . folic acid  1 mg Oral Daily  . furosemide  40 mg Intravenous Daily  . heparin injection (subcutaneous)  5,000 Units Subcutaneous Q8H  . hydrALAZINE  25 mg Oral TID  . insulin glargine  7 Units Subcutaneous QHS  . levalbuterol  1.25 mg Nebulization Q6H  . loratadine  10 mg Oral Daily  . methylPREDNISolone (SOLU-MEDROL) injection  80 mg Intravenous Daily  . metoprolol tartrate  25 mg Oral BID  . pantoprazole  40 mg Oral BID  . tamsulosin  0.4 mg Oral QPC supper  . thiamine  100 mg Oral Daily   Continuous Infusions: . diltiazem (CARDIZEM) infusion 15 mg/hr (06/04/18 0218)     LOS: 2 days   The patient is critically ill with multiple organ systems failure and requires high complexity decision making  for assessment and support, frequent evaluation and titration of therapies, application of advanced monitoring technologies and extensive  interpretation of multiple databases. Critical Care Time devoted to patient care services described in this note  Time spent: 40 minutes     Tyrez Berrios, Roselind Messier, MD [facility] Pager [phone]   If 7PM-7AM, please contact night-coverage [email or web address] Password [facility] 06/04/2018, 8:48 AM

## 2018-06-04 NOTE — H&P (Signed)
Physical Medicine and Rehabilitation Admission H&P    Chief complaint: Weakness  HPI: Richard Wang 73 year old right-handed male with history of COPD with remote tobacco abuse not on home oxygen at baseline, Crohn's colitis/GI bleed, diabetes mellitus, chronic low back pain with foot drop, CAD with non-STEMI maintained on aspirin, renal insufficiency, atrial fibrillation and hypertension.  Patient [facility] with increasing shortness of breath and lower extremity edema.  Chest x-ray and echocardiogram showed moderate LV systolic dysfunction and moderate to severe MR and moderate pulmonary systolic hypertension consistent with CHF.  Patient was started on BiPAP however deteriorated and ended up having to be intubated placed on the ventilator.  Patient subsequently complications associated with worsening renal function, congestive heart failure.  Attempts at extubation failed and he was transferred to [facility] 04/26/2018 for ventilation wean.  Initially on IV Solu-Medrol transition to PO prednisone after ventilation discontinued.  Tracheostomy tube was not needed.  Patient later extubated successfully and monitored.  Continued on prednisone with slow taper.  Subcutaneous heparin for DVT prophylaxis.  Remained on aspirin therapy as well as Cardizem for history of atrial fibrillation.   Therapy evaluations completed and patient was admitted for a comprehensive rehab program 05/17/2018.  Patient with steady progressive gains.  Noted on 06/02/2018 around 2 PM developed rapid heart rate nonspecific chest pain increasing shortness of breath and hypoxia.  He did receive nitro x2 and morphine 2 mg x 2 with minimal relief.  He was saturating well on 8 L of oxygen.  Heart rate noted to be in the 160s.  Troponin elevated 0.14-1.24.  EKG showed rapid rate possibly some worsening heart block.  Chest x-ray concerning for mild bilateral pulmonary edema.  He was discharged to  acute care services for ongoing evaluation 06/02/2018.  Cardiology service follow-up Dr. Algie CofferKadakia.  Echocardiogram with ejection fraction of 35 to 40% moderately reduced systolic function.  His amiodarone was resumed for heart rate control and myocardial ischemia.  Intravenous Cardizem was initiated and continued on telemetry with transition to PO diltiazem. Patient has been using nonrebreather mask at 4 L for comfort can use nasal cannula but prefers facemask if possible. He remains on subcutaneous heparin for DVT prophylaxis.  His diet has been advanced to a regular consistency. AKI felt to be related to diuresis creatinine 2.08. Metformin was held due to elevations in creatinine.Noted history of Crohn's colitis and did receive Remicade after contacts made to Dr.Wimmer [phone] [facility]  Patient did have some bouts of urinary retention maintained on low-dose Urecholine. Klebsiella pneumo UTI completing course of Bactrim. Therapies have been resumed patient is admitted to inpatient rehab service to continue comprehensive rehab.  Review of Systems  Constitutional: Negative for chills and fever.  HENT: Negative for hearing loss.   Eyes: Negative for blurred vision and double vision.  Respiratory: Positive for cough and shortness of breath.   Cardiovascular: Positive for palpitations.  Gastrointestinal: Positive for constipation. Negative for heartburn, nausea and vomiting.  Genitourinary: Positive for urgency. Negative for dysuria, flank pain and hematuria.  Musculoskeletal: Positive for joint pain and myalgias.  Skin: Negative for rash.  Psychiatric/Behavioral: The patient has insomnia.   All other systems reviewed and are negative.  Past Medical History:  Diagnosis Date   Acute and chronic respiratory failure with hypoxia (HCC)    Acute myocardial infarction, subendocardial infarction, subsequent episode of care [facility](HCC)    Acute systolic heart failure (HCC)  Atrial fibrillation (HCC)    Chronic atrial fibrillation    Crohn's colitis (HCC)    Diabetes mellitus without complication (HCC)    Hypertension    Sepsis with acute organ dysfunction (HCC)    Unspecified septicemia(038.9) (HCC)    Past Surgical History:  Procedure Laterality Date   CAROTID ENDARTERECTOMY Bilateral    Family History  Problem Relation Age of Onset   Bone cancer Brother    Social History:  reports that he has been smoking. He has been smoking about 1.50 packs per day. He has never used smokeless tobacco. He reports previous drug use. No history on file for alcohol. Allergies:  Allergies  Allergen Reactions   Penicillins    Medications Prior to Admission  Medication Sig Dispense Refill   inFLIXimab in sodium chloride 0.9 % Inject 5 mg/kg into the vein every 8 (eight) weeks. Premed with Tylenol 650 and Benadryl 25mg      amiodarone (PACERONE) 200 MG tablet Take 200 mg by mouth daily.     amLODipine (NORVASC) 10 MG tablet Take 10 mg by mouth daily.     atorvastatin (LIPITOR) 40 MG tablet Take 40 mg by mouth.     carvedilol (COREG) 6.25 MG tablet Take 6.25 mg by mouth 2 (two) times daily.     clonazePAM (KLONOPIN) 0.5 MG tablet Take 0.5 mg by mouth 2 (two) times daily as needed for anxiety.     hydrALAZINE (APRESOLINE) 50 MG tablet Take 50 mg by mouth 3 (three) times daily.     HYDROcodone-acetaminophen (NORCO) 7.5-325 MG tablet Take 1 tablet by mouth 3 (three) times daily as needed for pain.     lisinopril (PRINIVIL,ZESTRIL) 10 MG tablet Take 10 mg by mouth daily.     metFORMIN (GLUCOPHAGE) 1000 MG tablet Take 1,000 mg by mouth 2 (two) times daily.     ondansetron (ZOFRAN) 4 MG tablet Take 4 mg by mouth 3 (three) times daily as needed for nausea/vomiting.     pantoprazole (PROTONIX) 40 MG tablet Take 40 mg by mouth daily.     rOPINIRole (REQUIP) 1 MG tablet Take 2-4 mg by mouth See admin instructions. Take 2mg  in the morning as needed for restless  legs and 3mg  to 4mg  one hour prior to bedtime as needed for restless legs.      Drug Regimen Review Drug regimen was reviewed and remains appropriate with no significant issues identified   Home: Home Living Family/patient expects to be discharged to:: Private residence Living Arrangements: Other relatives(brother and sister in law) Available Help at Discharge: Family, Available 24 hours/day Type of Home: Mobile home Home Access: Ramped entrance Home Layout: One level Bathroom Shower/Tub: Engineer, manufacturing systems: Standard Home Equipment: Environmental consultant - 2 wheels, Shower seat  Lives With: FamilyPatient lives with his sister, brother-in-law and niece.  One level home.   Functional History: Prior Function Level of Independence: Independent Comments: could walk up to 100 feet at baseline Independent with basic ADLs and driving.  Ambulating 50 feet. Functional Status:  Mobility: Bed Mobility Overal bed mobility: Modified Independent General bed mobility comments: HOB upMin mod assist mobility Transfers Overall transfer level: Needs assistance Equipment used: Rolling walker (2 wheeled) Transfers: Sit to/from Stand Sit to Stand: Min assist General transfer comment: steadying assist, pt with posterior lean and blocking knees on bed upon initially standing      ADL: Minimal to moderate assist upper and lower body ADLs ADL Overall ADL's : Needs assistance/impaired Eating/Feeding: Independent, Sitting Grooming: Set up, Sitting  Upper Body Bathing: Set up, Sitting Lower Body Bathing: Moderate assistance, Sit to/from stand Upper Body Dressing : Set up, Sitting Lower Body Dressing: Moderate assistance, Sit to/from stand Toilet Transfer: Minimal assistance, RW, +2 for safety/equipment Toileting- Clothing Manipulation and Hygiene: Moderate assistance, Sit to/from stand Functional mobility during ADLs: Minimal assistance, Rolling walker  Cognition: Cognition Overall Cognitive  Status: Within Functional Limits for tasks assessed Orientation Level: Oriented X4 Cognition Arousal/Alertness: Awake/alert Behavior During Therapy: Anxious Overall Cognitive Status: Within Functional Limits for tasks assessed General Comments: pt ruminates on past medical history  Physical Exam: Blood pressure 122/79, pulse 72, temperature 97.8 F (36.6 C), temperature source Oral, resp. rate 13, height  (1.778 m), weight 77.3 kg, SpO2 95 %. Physical Exam  Neurological:  Patient is alert but a bit anxious. Follows commands. Provides his name and age.  Skin:  Skin color is chronically gray    Results for orders placed or performed during the hospital encounter of 06/02/18 (from the past 48 hour(s))  Glucose, capillary     Status: Abnormal   Collection Time: 06/04/18  6:21 AM  Result Value Ref Range   Glucose-Capillary 252 (H) 70 - 99 mg/dL  Glucose, capillary     Status: Abnormal   Collection Time: 06/04/18 12:49 PM  Result Value Ref Range   Glucose-Capillary 236 (H) 70 - 99 mg/dL  Glucose, capillary     Status: Abnormal   Collection Time: 06/04/18  5:16 PM  Result Value Ref Range   Glucose-Capillary 181 (H) 70 - 99 mg/dL  Glucose, capillary     Status: Abnormal   Collection Time: 06/04/18  9:48 PM  Result Value Ref Range   Glucose-Capillary 209 (H) 70 - 99 mg/dL  Basic metabolic panel     Status: Abnormal   Collection Time: 06/05/18  4:09 AM  Result Value Ref Range   Sodium 137 135 - 145 mmol/L   Potassium 4.4 3.5 - 5.1 mmol/L   Chloride 103 98 - 111 mmol/L   CO2 20 (L) 22 - 32 mmol/L   Glucose, Bld 191 (H) 70 - 99 mg/dL   BUN 52 (H) 8 - 23 mg/dL   Creatinine, Ser 4.33 (H) 0.61 - 1.24 mg/dL   Calcium 9.2 8.9 - 29.5 mg/dL   GFR calc non Af Amer 32 (L) >60 mL/min   GFR calc Af Amer 37 (L) >60 mL/min   Anion gap 14 5 - 15    Comment: Performed at [facility], [address], [place], [place] [address]  Magnesium     Status: None   Collection Time:  06/05/18  4:09 AM  Result Value Ref Range   Magnesium 2.4 1.7 - 2.4 mg/dL    Comment: Performed at [facility], [address], [place], [place] [address]  Glucose, capillary     Status: Abnormal   Collection Time: 06/05/18  6:36 AM  Result Value Ref Range   Glucose-Capillary 173 (H) 70 - 99 mg/dL  Glucose, capillary     Status: Abnormal   Collection Time: 06/05/18 11:46 AM  Result Value Ref Range   Glucose-Capillary 159 (H) 70 - 99 mg/dL  Glucose, capillary     Status: Abnormal   Collection Time: 06/05/18  4:58 PM  Result Value Ref Range   Glucose-Capillary 213 (H) 70 - 99 mg/dL  Glucose, capillary     Status: Abnormal   Collection Time: 06/05/18  9:32 PM  Result Value Ref Range   Glucose-Capillary 161 (  H) 70 - 99 mg/dL  Basic metabolic panel     Status: Abnormal   Collection Time: 06/06/18  3:43 AM  Result Value Ref Range   Sodium 137 135 - 145 mmol/L   Potassium 4.8 3.5 - 5.1 mmol/L   Chloride 102 98 - 111 mmol/L   CO2 24 22 - 32 mmol/L   Glucose, Bld 191 (H) 70 - 99 mg/dL   BUN 65 (H) 8 - 23 mg/dL   Creatinine, Ser 1.51 (H) 0.61 - 1.24 mg/dL   Calcium 9.0 8.9 - 76.1 mg/dL   GFR calc non Af Amer 31 (L) >60 mL/min   GFR calc Af Amer 36 (L) >60 mL/min   Anion gap 11 5 - 15    Comment: Performed at [facility], [address] [place], [place] [address]  Magnesium     Status: None   Collection Time: 06/06/18  3:43 AM  Result Value Ref Range   Magnesium 2.2 1.7 - 2.4 mg/dL    Comment: Performed at [facility], [address] [place], [place] [address]   No results found.     Medical Problem List and Plan: 1.  Debility secondary to acute on chronic respiratory failure with hypoxemia and hypercarbia. Wean O2 as tolerated goals oxygen saturation 89-92%Slow prednisone taper. Patient also with peripheral neuropathy left side foot drop/multi-medical 2.  Antithrombotics: -DVT/anticoagulation: Subcutaneous heparin  -antiplatelet therapy: Aspirin 81 mg  daily 3. Pain Management/Chronic back pain: Hydrocodone and Robaxin as needed 4. Mood: Provide emotional support  -antipsychotic agents: N/A 5. Neuropsych: This patient is capable of making decisions on his own behalf. 6. Skin/Wound Care: Routine skin checks 7. Fluids/Electrolytes/Nutrition: Routine in and outs with follow-up chemistries 8.  CAD with non-STEMI.  Continue aspirin.Follow-up per  cardiology services 9.  Atrial fibrillation.  Diltiazem 240 mg daily/amiodarone 200 mg daily.  Cardiac rate controlled 10.  Hypertension.  Hydralazine 25 mg 3 times daily 11. Diastolic congestive heart failure. Monitor for any signs of fluid overload.Continue Lasix as directed 12. Urinary retention/Klebsiella pneumo UTI. Urecholine 10 mg 3 times a day, Flomax 0.4 mg daily. complete course of Bactrim 13. Hyperlipidemia. Lipitor 14. Diabetes mellitus. Latest hemoglobin A1c 5.0. Presently onLantus insulin 7 units daily at bedtime. Patient on metformin 1000 mg twice a day prior to admission but held due to mildly elevated creatinine. Check blood sugars before meals and at bedtime. Monitor with taper of prednisone. Diabetic teaching 15. Crohn's colitis with history of GI bleed. Protonix 40 mg twice a day.Patient did receive Remicade 06/05/2018 and is scheduled every 8 weeks 16. AKI/renal insufficiency. Strict in and out's. Follow-up chemistries. 16. Tobacco abuse. Counseling 17. Acute on chronic anemia. Follow-up CBC   Charlton Amor, PA-C 06/06/2018

## 2018-06-05 DIAGNOSIS — K50918 Crohn's disease, unspecified, with other complication: Secondary | ICD-10-CM

## 2018-06-05 DIAGNOSIS — E119 Type 2 diabetes mellitus without complications: Secondary | ICD-10-CM

## 2018-06-05 DIAGNOSIS — R627 Adult failure to thrive: Secondary | ICD-10-CM

## 2018-06-05 LAB — BASIC METABOLIC PANEL
Anion gap: 14 (ref 5–15)
BUN: 52 mg/dL — ABNORMAL HIGH (ref 8–23)
CO2: 20 mmol/L — ABNORMAL LOW (ref 22–32)
Calcium: 9.2 mg/dL (ref 8.9–10.3)
Chloride: 103 mmol/L (ref 98–111)
Creatinine, Ser: 2.04 mg/dL — ABNORMAL HIGH (ref 0.61–1.24)
GFR calc Af Amer: 37 mL/min — ABNORMAL LOW (ref >60)
GFR calc non Af Amer: 32 mL/min — ABNORMAL LOW (ref >60)
Glucose, Bld: 191 mg/dL — ABNORMAL HIGH (ref 70–99)
Potassium: 4.4 mmol/L (ref 3.5–5.1)
Sodium: 137 mmol/L (ref 135–145)

## 2018-06-05 LAB — GLUCOSE, CAPILLARY
Glucose-Capillary: 173 mg/dL — ABNORMAL HIGH (ref 70–99)
Glucose-Capillary: 159 mg/dL — ABNORMAL HIGH (ref 70–99)
Glucose-Capillary: 213 mg/dL — ABNORMAL HIGH (ref 70–99)
Glucose-Capillary: 161 mg/dL — ABNORMAL HIGH (ref 70–99)

## 2018-06-05 LAB — MAGNESIUM: Magnesium: 2.4 mg/dL (ref 1.7–2.4)

## 2018-06-05 MED ORDER — SODIUM CHLORIDE 0.9 % IV SOLN
5.0000 mg/kg | INTRAVENOUS | Status: DC
  Administered 2018-06-05: 400 mg via INTRAVENOUS
  Filled 2018-06-05: qty 40

## 2018-06-05 MED ORDER — ACETAMINOPHEN 325 MG PO TABS
650.0000 mg | ORAL_TABLET | ORAL | Status: DC
  Administered 2018-06-05: 650 mg via ORAL
  Filled 2018-06-05: qty 2

## 2018-06-05 MED ORDER — DIPHENHYDRAMINE HCL 25 MG PO CAPS
25.0000 mg | ORAL_CAPSULE | ORAL | Status: DC
  Administered 2018-06-05: 25 mg via ORAL
  Filled 2018-06-05: qty 1

## 2018-06-05 MED ORDER — DILTIAZEM HCL ER 240 MG PO CP24
240.0000 mg | ORAL_CAPSULE | Freq: Every day | ORAL | Status: DC
  Administered 2018-06-05 – 2018-06-06 (×2): 240 mg via ORAL
  Filled 2018-06-05 (×4): qty 1

## 2018-06-05 NOTE — Progress Notes (Addendum)
[facility]: DR. Eric Form [phone]  [facility]: Dr. Suezanne Jacquet [phone] - Remicade dose 5mg /kg/8 wks with Tylenol 650mg  and Benadryl 25mg  premeds  Sayra Frisby S. Merilynn Finland, PharmD, BCPS Clinical Staff Pharmacist

## 2018-06-05 NOTE — Evaluation (Signed)
Occupational Therapy Evaluation Patient Details Name: Richard Wang MRN: [id number] DOB: 23-Oct-1945 Today's Date: 06/05/2018    History of Present Illness Pt admitted to acute from CIR on 4/12 with SOB, afib with RVR. Pt had been participating in rehab since 3/27. PMH: CHF, COPD, crohns disease, DM, afib, GIB, chronic back pain, CAD and foot drop.   Clinical Impression   At his baseline, pt walks up to 100 feet without a device and is independent in self care. He lives with his brother and sister in law who are in bad health, per pt's report. Pt presents with generalized weakness and impaired standing balance with posterior lean and heavy reliance on UEs. He requires set up to moderate assistance for ADL. Recommend return to CIR to complete rehab. Will follow acutely.    Follow Up Recommendations  CIR    Equipment Recommendations  3 in 1 bedside commode    Recommendations for Other Services       Precautions / Restrictions Precautions Precautions: Fall Precaution Comments: pt insistent on 3L NRB, but Sp02 noted to by 92% or above on RA      Mobility Bed Mobility Overal bed mobility: Modified Independent             General bed mobility comments: HOB up  Transfers Overall transfer level: Needs assistance Equipment used: Rolling walker (2 wheeled) Transfers: Sit to/from Stand Sit to Stand: Min assist         General transfer comment: steadying assist, pt with posterior lean and blocking knees on bed upon initially standing    Balance Overall balance assessment: Needs assistance   Sitting balance-Leahy Scale: Good     Standing balance support: Bilateral upper extremity supported Standing balance-Leahy Scale: Poor Standing balance comment: heavy reliance on B UEs, tremulous LEs, posterior lean                           ADL either performed or assessed with clinical judgement   ADL Overall ADL's : Needs assistance/impaired Eating/Feeding:  Independent;Sitting   Grooming: Set up;Sitting   Upper Body Bathing: Set up;Sitting   Lower Body Bathing: Moderate assistance;Sit to/from stand   Upper Body Dressing : Set up;Sitting   Lower Body Dressing: Moderate assistance;Sit to/from stand   Toilet Transfer: Minimal assistance;RW;+2 for safety/equipment   Toileting- Clothing Manipulation and Hygiene: Moderate assistance;Sit to/from stand       Functional mobility during ADLs: Minimal assistance;Rolling walker       Vision Baseline Vision/History: Wears glasses Patient Visual Report: No change from baseline       Perception     Praxis      Pertinent Vitals/Pain Pain Assessment: Faces Faces Pain Scale: Hurts a little bit Pain Location: back, abdomen Pain Descriptors / Indicators: Sore Pain Intervention(s): Monitored during session;Repositioned     Hand Dominance Right   Extremity/Trunk Assessment Upper Extremity Assessment Upper Extremity Assessment: Overall WFL for tasks assessed   Lower Extremity Assessment Lower Extremity Assessment: Defer to PT evaluation   Cervical / Trunk Assessment Cervical / Trunk Assessment: Other exceptions Cervical / Trunk Exceptions: hx of chronic back pain   Communication Communication Communication: No difficulties   Cognition Arousal/Alertness: Awake/alert Behavior During Therapy: Anxious Overall Cognitive Status: Within Functional Limits for tasks assessed                                 General  Comments: pt ruminates on past medical history   General Comments       Exercises     Shoulder Instructions      Home Living Family/patient expects to be discharged to:: Private residence Living Arrangements: Other relatives(brother and sister in law) Available Help at Discharge: Family;Available 24 hours/day Type of Home: Mobile home Home Access: Ramped entrance     Home Layout: One level     Bathroom Shower/Tub: Contractor: Standard     Home Equipment: Environmental consultant - 2 wheels;Shower seat      Lives With: Family    Prior Functioning/Environment Level of Independence: Independent        Comments: could walk up to 100 feet at baseline        OT Problem List: Decreased strength;Decreased activity tolerance;Impaired balance (sitting and/or standing);Decreased knowledge of use of DME or AE;Pain      OT Treatment/Interventions: Self-care/ADL training;DME and/or AE instruction;Therapeutic activities;Patient/family education;Balance training    OT Goals(Current goals can be found in the care plan section) Acute Rehab OT Goals Patient Stated Goal: to get his medication for Crohns OT Goal Formulation: With patient Time For Goal Achievement: 06/19/18 Potential to Achieve Goals: Good ADL Goals Pt Will Perform Grooming: with supervision;standing Pt Will Perform Lower Body Bathing: with supervision;sit to/from stand Pt Will Perform Lower Body Dressing: with supervision;sit to/from stand Pt Will Transfer to Toilet: with supervision;ambulating;bedside commode Pt Will Perform Toileting - Clothing Manipulation and hygiene: with supervision;sit to/from stand Pt Will Perform Tub/Shower Transfer: with supervision;shower seat;Tub transfer;rolling walker  OT Frequency: Min 2X/week   Barriers to D/C:            Co-evaluation PT/OT/SLP Co-Evaluation/Treatment: Yes Reason for Co-Treatment: For patient/therapist safety   OT goals addressed during session: ADL's and self-care      AM-PAC OT "6 Clicks" Daily Activity     Outcome Measure Help from another person eating meals?: None Help from another person taking care of personal grooming?: A Little Help from another person toileting, which includes using toliet, bedpan, or urinal?: A Lot Help from another person bathing (including washing, rinsing, drying)?: A Lot Help from another person to put on and taking off regular upper body clothing?: A Little Help  from another person to put on and taking off regular lower body clothing?: A Lot 6 Click Score: 16   End of Session Equipment Utilized During Treatment: Rolling walker  Activity Tolerance: Patient tolerated treatment well Patient left: in bed;with call bell/phone within reach;with bed alarm set  OT Visit Diagnosis: Unsteadiness on feet (R26.81);Other abnormalities of gait and mobility (R26.89);Pain;Muscle weakness (generalized) (M62.81)                Time: 1000-1019 OT Time Calculation (min): 19 min Charges:  OT General Charges $OT Visit: 1 Visit OT Evaluation $OT Eval Moderate Complexity: 1 Mod  Martie Round, OTR/L Acute Rehabilitation Services Pager: [phone] Office: [phone]  Evern Bio 06/05/2018, 11:07 AM

## 2018-06-05 NOTE — Progress Notes (Signed)
PROGRESS NOTE  [name]:[id number] DOB: 13-Jan-1946 DOA: 06/02/2018 PCP: System, Pcp Not In  Brief Narrative:  73 y.o.malePMHx COPD, HTN, DM2, Crohn's disease, A-fib (recurrent GI bleed while on anticoagulation discontinued), CHF (No TTE in our system), CAD, foot drop, low back pain on chronic narcotics who is in rehab after a hospitalization at [facility] for acute on chronic respiratory failure due to CHF and COPD.   He was transitioned to Rehab on no oxygen. Today, he was intermittently SOB and around 2pm this afternoon developed a rapid heart rate, chest pain, SOB and hypoxia. He reports that the chest pain is central, somewhat reproducible, associated with radiation down both arms and SOB. He was somewhat gray when I entered the room at first, however, he was not diaphoretic. He has had an MI before and he did not feel this pain was similar, but he was worried. He further had palpitations. He had had nitro X 2 and morphine 2mg  X 2. The morphine helped somewhat with the pain. He was saturating well on 8L of oxygen. He had a HR of 160s which improved to 140s with morphine and then to 120s-130s with 2.5mg  of metoprolol IV push. His home meds list amiodarone and carvedilol, however, currently he is on metoprolol 12.5mg  BID and Cardizem 30mg  BID. His cardizem was increased to TID this AM as his HR was noted to be getting worse and he was started on aspirin. He is not currently on an anticoagulant as he has had issues with severe bleeding in the past, needed frequent blood transfusions. He would rather not be on an anticoagulant at this time, however, he is okay with aspirin. His GI bleeding is thought to be due to his Crohn's disease. He specifically denied fever, chills, nausea, vomiting, dysuria, change in urine.   When I evaluated the patient in the Rehab unit, he had an elevated HR, was breathing heavily and was being cared for by RRT nursing. WBC was 16.7,  up from 9.1 yesterday. K was improved to 4.1. Magnesium will be added on. Initial troponin was <0.03. EKG was reviewed and showed rapid rate, possibly some worsening heart block. Will need to repeat once rate is improved. CXR done showed mild pulmonary edema.      HPI/Recap of past 24 hours:  No edema, no cough, no chest pain, no fever, he remain in afib/rate controlled He is concerned about his Crohn's disease, he wants to get remicaide  Assessment/Plan: Active Problems:   Acute and chronic respiratory failure with hypoxia (HCC)   Acute systolic heart failure (HCC)   Atrial fibrillation (HCC)   Essential hypertension   Hypokalemia   Atrial fibrillation with RVR (HCC)   Shortness of breath   AKI (acute kidney injury) (HCC)   Steroid-dependent COPD (HCC)   Crohn's disease (HCC)  Afib/RVR (presenting symptom) -patient is transferred from inpatient rehab to [place] due to c/o sob, found to be in afib/rvr -he is not a candidate for chronic anticoagulation due to h/o gi bleed/chron's diease , he was last hospitalized for gi bleed 65months ago -cardiology consulted for afib management, he remains in afib, rate is better controlled on metoprolol/cardizem/amiodarone   Acute systolic chf: -patient c/o worsening of sob on 4/13 -cxr "Findings are consistent with bilateral pulmonary edema, slightly progressed at the left lung base." -echo on 4/13 showed "moderately reduced systolic function, with an ejection fraction of 35-40%." -he is started on lasix 40mg  daily on 4/14 -monitor bp/cr, cardiology  consulted, will follow recommendation  Acute hypoxic respiratory failure (presenting symptom) He is not on home o2 at baseline Hypoxia likely due to afib/chf Wean o2 as tolerated  AKI on CKDII -from lasix? -urine culture pending, denies urinary symptom  Anemia of chronic disease -h/o gi bleed from chron's, currently on overt blood loss -hgb stable at 9  Leukocytosis: No fever  From steroid? Blood culture/urine culture in process  COPD, baseline not o2 dependent, but does has baseline DOE -Patient states stopped smoking when he was admitted to hospital. He reports started smoke cigarette since he was 73 yr old - Titrate O2 to maintain SPO2 89 to 92% - Xopenex QID  noninsulin dependent DM2, Home meds metformin held a1c 5 Hyperglycemia likely due to steroids On ssi and lantus  Crohn's disease -4/14 patient with flare with ab pain, no diarrhea, no hematochezia, no pus in stool -he received Solu-Medrol, then oral prednisone -he reports was started on remicade 6months ago, he gets it every two weeks by Dr Benay [facility], last does was more than a months ago, he wants to get it here. Patient can not remember his gi doctor's name. -appreciate pharmacy assistance   Low back pain with left leg weakness/foot drop chronic  FTT/Prolonged hospitalization, will benefit inpatient rehab [facility], eventually discharge [facility] He was then [facility] for afib/rvr/sob/pumonary edema, baseline not o2 dependent  Per niece, patient was independent, driving himself [facility] appointments [facility], Prior [facility]  Code Status: full  Family Communication: patient , niece tammy over the phone [phone]  Disposition Plan: to inpatient rehab   Consultants:  Cardiology  CIR  Procedures:  none  Antibiotics:  none   Objective: BP (!) 130/91 (BP Location: Left Arm)   Pulse 84   Temp 98.1 F (36.7 C) (Oral)   Resp 12   Ht 5\' 10"  (1.778 m)   Wt 77.9 kg   SpO2 99%   BMI 24.64 kg/m   Intake/Output Summary (Last 24 hours) at 06/05/2018 1009 Last data filed at 06/05/2018 0600 Gross per 24 hour  Intake 677.74 ml  Output 675 ml  Net 2.74 ml   Filed Weights   06/03/18 0500 06/04/18 0400  06/05/18 0423  Weight: 76.4 kg 76.2 kg 77.9 kg    Exam: Patient is examined daily including today on 06/05/2018, exams remain the same as of yesterday except that has changed    General:  NAD  Cardiovascular: IRRR  Respiratory: diminished, no wheezing,no rales, no rhonchi  Abdomen: Soft/ND/NT, positive BS  Musculoskeletal: No Edema  Neuro: alert, oriented   Data Reviewed: Basic Metabolic Panel: Recent Labs  Lab 06/02/18 0415 06/02/18 1536 06/02/18 1912 06/03/18 0845 06/04/18 0015 06/05/18 0409  NA 145 144  --  144 139 137  K 3.1* 4.1  --  3.8 4.5 4.4  CL 113* 114*  --  113* 106 103  CO2 17* 17*  --  20* 20* 20*  GLUCOSE 83 211*  --  97 241* 191*  BUN 25* 27*  --  26* 26* 52*  CREATININE 1.18 1.30*  --  1.20 1.36* 2.04*  CALCIUM 8.4* 8.7*  --  9.0 9.1 9.2  MG 1.0*  --  1.1* 1.8 1.6* 2.4   Liver Function Tests: No results for input(s): AST, ALT, ALKPHOS, BILITOT, PROT, ALBUMIN in the last 168  hours. No results for input(s): LIPASE, AMYLASE in the last 168 hours. No results for input(s): AMMONIA in the last 168 hours. CBC: Recent Labs  Lab 06/01/18 1426 06/02/18 1536 06/03/18 0845  WBC 9.1 16.7* 12.5*  HGB 8.1* 9.2* 9.1*  HCT 27.5* 32.4* 31.8*  MCV 90.2 91.0 90.1  PLT 156 245 197   Cardiac Enzymes:   Recent Labs  Lab 06/01/18 1426  06/03/18 0012 06/03/18 0845 06/03/18 1209 06/03/18 1837 06/03/18 2331  CKTOTAL 20*  --   --   --   --   --   --   CKMB 1.3  --   --   --   --   --   --   TROPONINI 0.03*   < > 0.98* 1.24* 1.04* 0.72* 0.52*   < > = values in this interval not displayed.   BNP (last 3 results) Recent Labs    04/28/18 1052  BNP 1,239.6*    ProBNP (last 3 results) No results for input(s): PROBNP in the last 8760 hours.  CBG: Recent Labs  Lab 06/04/18 0621 06/04/18 1249 06/04/18 1716 06/04/18 2148 06/05/18 0636  GLUCAP 252* 236* 181* 209* 173*    Recent Results (from the past 240 hour(s))  Culture, Urine     Status:  Abnormal (Preliminary result)   Collection Time: 06/03/18  2:16 PM  Result Value Ref Range Status   Specimen Description URINE, RANDOM  Final   Special Requests   Final    NONE Performed at [facility], [address] [place], [place] [address]    Culture >=100,000 COLONIES/mL KLEBSIELLA PNEUMONIAE (A)  Final   Report Status PENDING  Incomplete  Culture, blood (routine x 2)     Status: None (Preliminary result)   Collection Time: 06/03/18  2:51 PM  Result Value Ref Range Status   Specimen Description BLOOD LEFT HAND  Final   Special Requests   Final    BOTTLES DRAWN AEROBIC ONLY Blood Culture adequate volume   Culture   Final    NO GROWTH < 24 HOURS Performed at [facility], [address], [place], [place] [address]    Report Status PENDING  Incomplete  Culture, blood (routine x 2)     Status: None (Preliminary result)   Collection Time: 06/03/18  2:54 PM  Result Value Ref Range Status   Specimen Description BLOOD RIGHT HAND  Final   Special Requests   Final    BOTTLES DRAWN AEROBIC ONLY Blood Culture adequate volume   Culture   Final    NO GROWTH < 24 HOURS Performed at [facility], [address], [place], [place] [address]    Report Status PENDING  Incomplete     Studies: No results found.  Scheduled Meds: . amiodarone  200 mg Oral Daily  . aspirin EC  81 mg Oral Daily  . atorvastatin  40 mg Oral q1800  . bethanechol  10 mg Oral TID  . folic acid  1 mg Oral Daily  . furosemide  40 mg Intravenous Daily  . heparin injection (subcutaneous)  5,000 Units Subcutaneous Q8H  . hydrALAZINE  25 mg Oral TID  . insulin aspart  0-15 Units Subcutaneous TID WC  . insulin glargine  7 Units Subcutaneous QHS  . levalbuterol  1.25 mg Nebulization TID  . loratadine  10 mg Oral Daily  . metoprolol tartrate  25 mg Oral BID  . pantoprazole  40 mg Oral BID  .  predniSONE  60 mg Oral Q breakfast  . tamsulosin  0.4 mg Oral QPC supper  . thiamine  100 mg Oral Daily     Continuous Infusions: . diltiazem (CARDIZEM) infusion 10 mg/hr (06/05/18 0600)     Time spent: I have personally reviewed and interpreted on  06/05/2018 daily labs, tele strips, imagings as discussed above under date review session and assessment and plans.  I reviewed all nursing notes, pharmacy notes, consultant notes,  vitals, pertinent old records  I have discussed plan of care as described above with RN , patient and family on 06/05/2018   Albertine Grates MD, PhD  [facility] Pager [phone]. If 7PM-7AM, please contact night-coverage at [email or web address], password [facility] 06/05/2018, 10:09 AM  LOS: 3 days

## 2018-06-05 NOTE — Evaluation (Signed)
Physical Therapy Evaluation Patient Details Name: Richard Wang MRN: [id number] DOB: Mar 06, 1945 Today's Date: 06/05/2018   History of Present Illness  Pt admitted to acute from CIR on 4/12 with SOB, afib with RVR. Pt had been participating in rehab since 3/27. PMH: CHF, COPD, crohns disease, DM, afib, GIB, chronic back pain, CAD and foot drop.    Clinical Impression  Pt admitted with above diagnosis. Pt currently with functional limitations due to the deficits listed below (see PT Problem List). PTA, pt at [facility] working with therapy ambulating. Today standing EOB and side stepping with min to mod A, SpO2 92%, HR 100-129.  Pt will benefit from skilled PT to increase their independence and safety with mobility to allow discharge to the venue listed below.       Follow Up Recommendations CIR    Equipment Recommendations       Recommendations for Other Services       Precautions / Restrictions Precautions Precautions: Fall Precaution Comments: pt insistent on 3L NRB, but Sp02 noted to by 92% or above on RA      Mobility  Bed Mobility Overal bed mobility: Modified Independent             General bed mobility comments: HOB up  Transfers Overall transfer level: Needs assistance Equipment used: Rolling walker (2 wheeled) Transfers: Sit to/from Stand Sit to Stand: Min assist         General transfer comment: steadying assist, pt with posterior lean and blocking knees on bed upon initially standing  Ambulation/Gait                Stairs            Wheelchair Mobility    Modified Rankin (Stroke Patients Only)       Balance Overall balance assessment: Needs assistance   Sitting balance-Leahy Scale: Good     Standing balance support: Bilateral upper extremity supported Standing balance-Leahy Scale: Poor Standing balance comment: heavy reliance on B UEs, tremulous LEs, posterior lean                             Pertinent Vitals/Pain  Faces Pain Scale: Hurts a little bit Pain Location: back, abdomen Pain Descriptors / Indicators: Sore    Home Living Family/patient expects to be discharged to:: Private residence Living Arrangements: Other relatives(brother and sister in law) Available Help at Discharge: Family;Available 24 hours/day Type of Home: Mobile home Home Access: Ramped entrance     Home Layout: One level Home Equipment: Walker - 2 wheels;Shower seat      Prior Function Level of Independence: Independent         Comments: could walk up to 100 feet at baseline     Hand Dominance   Dominant Hand: Right    Extremity/Trunk Assessment   Upper Extremity Assessment Upper Extremity Assessment: Overall WFL for tasks assessed    Lower Extremity Assessment Lower Extremity Assessment: Overall WFL for tasks assessed(Grossly 4-/5)    Cervical / Trunk Assessment Cervical / Trunk Assessment: Other exceptions Cervical / Trunk Exceptions: hx of chronic back pain  Communication   Communication: No difficulties  Cognition Arousal/Alertness: Awake/alert Behavior During Therapy: Anxious Overall Cognitive Status: Within Functional Limits for tasks assessed                                 General Comments: pt  ruminates on past medical history      General Comments      Exercises General Exercises - Lower Extremity Ankle Circles/Pumps: 20 reps Heel Slides: 20 reps Hip ABduction/ADduction: 20 reps Straight Leg Raises: 20 reps   Assessment/Plan    PT Assessment Patient needs continued PT services  PT Problem List Decreased strength       PT Treatment Interventions DME instruction;Gait training;Stair training;Functional mobility training;Therapeutic activities;Therapeutic exercise;Balance training    PT Goals (Current goals can be found in the Care Plan section)  Acute Rehab PT Goals Patient Stated Goal: to get his medication for Crohns PT Goal Formulation: With patient Time For  Goal Achievement: 06/19/18 Potential to Achieve Goals: Fair    Frequency Min 3X/week   Barriers to discharge        Co-evaluation PT/OT/SLP Co-Evaluation/Treatment: Yes   PT goals addressed during session: Mobility/safety with mobility;Balance;Proper use of DME         AM-PAC PT "6 Clicks" Mobility  Outcome Measure Help needed turning from your back to your side while in a flat bed without using bedrails?: A Little Help needed moving from lying on your back to sitting on the side of a flat bed without using bedrails?: A Little Help needed moving to and from a bed to a chair (including a wheelchair)?: A Little Help needed standing up from a chair using your arms (e.g., wheelchair or bedside chair)?: A Lot Help needed to walk in hospital room?: A Lot Help needed climbing 3-5 steps with a railing? : Total 6 Click Score: 14    End of Session Equipment Utilized During Treatment: Gait belt Activity Tolerance: Patient tolerated treatment well Patient left: in bed;with call bell/phone within reach;with bed alarm set Nurse Communication: Mobility status PT Visit Diagnosis: Unsteadiness on feet (R26.81)    Time: 1007-1030 PT Time Calculation (min) (ACUTE ONLY): 23 min   Charges:   PT Evaluation $PT Eval Moderate Complexity: 1 Mod          Etta GrandchildSean Ginna Schuur, PT, DPT Acute Rehabilitation Services Pager: [phone] Office: [phone]    Etta GrandchildSean Lakishia Bourassa 06/05/2018, 2:18 PM

## 2018-06-05 NOTE — Progress Notes (Signed)
Ref: System, Pcp Not In   Subjective:  Feeling better. Heart rate down to 90's to 100 bpm. Afebrile. Renal function deteriorating with lasix use. Patient agrees to medical treatment.   Objective:  Vital Signs in the last 24 hours: Temp:  [97.7 F (36.5 C)-98.1 F (36.7 C)] 98.1 F (36.7 C) (04/15 0833) Pulse Rate:  [57-133] 84 (04/15 0833) Cardiac Rhythm: Atrial fibrillation (04/15 0700) Resp:  [11-25] 12 (04/15 0833) BP: (107-130)/(67-91) 130/91 (04/15 0833) SpO2:  [93 %-99 %] 99 % (04/15 0833) Weight:  [77.9 kg] 77.9 kg (04/15 0423)  Physical Exam: BP Readings from Last 1 Encounters:  06/05/18 (!) 130/91     Wt Readings from Last 1 Encounters:  06/05/18 77.9 kg    Weight change: 1.7 kg Body mass index is 24.64 kg/m. HEENT: [place]/AT, Eyes- Conjunctiva-Pink, Sclera-Non-icteric Neck: No JVD, No bruit, Trachea midline. Lungs:  Clearing, Bilateral. Cardiac:  Regular rhythm, normal S1 and S2, no S3. II/VI systolic murmur. Abdomen:  Soft, non-tender. BS present. Extremities:  No edema present. No cyanosis. No clubbing. CNS: AxOx2, Cranial nerves grossly intact, moves all 4 extremities.  Skin: Warm and dry.   Intake/Output from previous day: 04/14 0701 - 04/15 0700 In: 677.7 [P.O.:360; I.V.:317.7] Out: 675 [Urine:675]    Lab Results: BMET    Component Value Date/Time   NA 137 06/05/2018 0409   NA 139 06/04/2018 0015   NA 144 06/03/2018 0845   K 4.4 06/05/2018 0409   K 4.5 06/04/2018 0015   K 3.8 06/03/2018 0845   CL 103 06/05/2018 0409   CL 106 06/04/2018 0015   CL 113 (H) 06/03/2018 0845   CO2 20 (L) 06/05/2018 0409   CO2 20 (L) 06/04/2018 0015   CO2 20 (L) 06/03/2018 0845   GLUCOSE 191 (H) 06/05/2018 0409   GLUCOSE 241 (H) 06/04/2018 0015   GLUCOSE 97 06/03/2018 0845   BUN 52 (H) 06/05/2018 0409   BUN 26 (H) 06/04/2018 0015   BUN 26 (H) 06/03/2018 0845   CREATININE 2.04 (H) 06/05/2018 0409   CREATININE 1.36 (H) 06/04/2018 0015   CREATININE 1.20 06/03/2018  0845   CALCIUM 9.2 06/05/2018 0409   CALCIUM 9.1 06/04/2018 0015   CALCIUM 9.0 06/03/2018 0845   GFRNONAA 32 (L) 06/05/2018 0409   GFRNONAA 52 (L) 06/04/2018 0015   GFRNONAA >60 06/03/2018 0845   GFRAA 37 (L) 06/05/2018 0409   GFRAA >60 06/04/2018 0015   GFRAA >60 06/03/2018 0845   CBC    Component Value Date/Time   WBC 12.5 (H) 06/03/2018 0845   RBC 3.53 (L) 06/03/2018 0845   HGB 9.1 (L) 06/03/2018 0845   HCT 31.8 (L) 06/03/2018 0845   PLT 197 06/03/2018 0845   MCV 90.1 06/03/2018 0845   MCH 25.8 (L) 06/03/2018 0845   MCHC 28.6 (L) 06/03/2018 0845   RDW 17.0 (H) 06/03/2018 0845   LYMPHSABS 1.2 05/27/2018 0916   MONOABS 0.4 05/27/2018 0916   EOSABS 0.0 05/27/2018 0916   BASOSABS 0.0 05/27/2018 0916   HEPATIC Function Panel Recent Labs    04/27/18 0124 05/20/18 0554  PROT 5.3* 5.9*   HEMOGLOBIN A1C No components found for: HGA1C,  MPG CARDIAC ENZYMES Lab Results  Component Value Date   CKTOTAL 20 (L) 06/01/2018   CKMB 1.3 06/01/2018   TROPONINI 0.52 (HH) 06/03/2018   TROPONINI 0.72 (HH) 06/03/2018   TROPONINI 1.04 (HH) 06/03/2018   BNP No results for input(s): PROBNP in the last 8760 hours. TSH Recent Labs  04/28/18 1052 06/03/18 0012  TSH 0.109* 1.411   CHOLESTEROL No results for input(s): CHOL in the last 8760 hours.  Scheduled Meds: . amiodarone  200 mg Oral Daily  . aspirin EC  81 mg Oral Daily  . atorvastatin  40 mg Oral q1800  . bethanechol  10 mg Oral TID  . diltiazem  240 mg Oral Daily  . folic acid  1 mg Oral Daily  . furosemide  40 mg Intravenous Daily  . heparin injection (subcutaneous)  5,000 Units Subcutaneous Q8H  . hydrALAZINE  25 mg Oral TID  . insulin aspart  0-15 Units Subcutaneous TID WC  . insulin glargine  7 Units Subcutaneous QHS  . levalbuterol  1.25 mg Nebulization TID  . loratadine  10 mg Oral Daily  . metoprolol tartrate  25 mg Oral BID  . pantoprazole  40 mg Oral BID  . predniSONE  60 mg Oral Q breakfast  .  tamsulosin  0.4 mg Oral QPC supper  . thiamine  100 mg Oral Daily   Continuous Infusions: PRN Meds:.acetaminophen, albuterol, HYDROcodone-acetaminophen, methocarbamol, morphine injection, nitroGLYCERIN, ondansetron (ZOFRAN) IV  Assessment/Plan: Acute on chronic respiratory failure with hypoxemia COPD Acute on chronic left systolic heart failure NSTEMI Iron deficiency anemia Chronic atrial fibrillation Crohn's colitis  Change IV diltiazem to PO. Medical therapy from cardiology.   LOS: 3 days    Orpah Cobb  MD  06/05/2018, 10:57 AM

## 2018-06-05 NOTE — Progress Notes (Signed)
Inpatient Rehabilitation Admissions Coordinator  I met with patient at bedside. He has worked with OT today. He does feel he will need readmission to inpt rehab to complete his rehab recovery before d/c home. I await PT eval today. Pt to receive Remicade today. I will follow up tomorrow.  Danne Baxter, RN, MSN Rehab Admissions Coordinator [phone] 06/05/2018 1:32 PM

## 2018-06-06 ENCOUNTER — Encounter (HOSPITAL_COMMUNITY): Payer: Self-pay | Admitting: *Deleted

## 2018-06-06 ENCOUNTER — Inpatient Hospital Stay (HOSPITAL_COMMUNITY)
Admission: RE | Admit: 2018-06-06 | Discharge: 2018-06-17 | DRG: 945 | Disposition: A | Discharge status: Home Health | Payer: Medicare Other | Source: Intra-hospital | Attending: Physical Medicine & Rehabilitation | Admitting: Physical Medicine & Rehabilitation

## 2018-06-06 DIAGNOSIS — I252 Old myocardial infarction: Secondary | ICD-10-CM

## 2018-06-06 DIAGNOSIS — N390 Urinary tract infection, site not specified: Secondary | ICD-10-CM | POA: Diagnosis present

## 2018-06-06 DIAGNOSIS — I5043 Acute on chronic combined systolic (congestive) and diastolic (congestive) heart failure: Secondary | ICD-10-CM | POA: Diagnosis present

## 2018-06-06 DIAGNOSIS — I5041 Acute combined systolic (congestive) and diastolic (congestive) heart failure: Secondary | ICD-10-CM | POA: Diagnosis not present

## 2018-06-06 DIAGNOSIS — D509 Iron deficiency anemia, unspecified: Secondary | ICD-10-CM | POA: Diagnosis present

## 2018-06-06 DIAGNOSIS — J9621 Acute and chronic respiratory failure with hypoxia: Secondary | ICD-10-CM | POA: Diagnosis present

## 2018-06-06 DIAGNOSIS — I4891 Unspecified atrial fibrillation: Secondary | ICD-10-CM

## 2018-06-06 DIAGNOSIS — E875 Hyperkalemia: Secondary | ICD-10-CM | POA: Diagnosis present

## 2018-06-06 DIAGNOSIS — I110 Hypertensive heart disease with heart failure: Secondary | ICD-10-CM | POA: Diagnosis present

## 2018-06-06 DIAGNOSIS — E785 Hyperlipidemia, unspecified: Secondary | ICD-10-CM | POA: Diagnosis present

## 2018-06-06 DIAGNOSIS — K5010 Crohn's disease of large intestine without complications: Secondary | ICD-10-CM | POA: Diagnosis present

## 2018-06-06 DIAGNOSIS — Z7984 Long term (current) use of oral hypoglycemic drugs: Secondary | ICD-10-CM

## 2018-06-06 DIAGNOSIS — R7401 Elevation of levels of liver transaminase levels: Secondary | ICD-10-CM

## 2018-06-06 DIAGNOSIS — R740 Nonspecific elevation of levels of transaminase and lactic acid dehydrogenase [LDH]: Secondary | ICD-10-CM | POA: Diagnosis present

## 2018-06-06 DIAGNOSIS — Z880 Allergy status to penicillin: Secondary | ICD-10-CM

## 2018-06-06 DIAGNOSIS — R0602 Shortness of breath: Secondary | ICD-10-CM

## 2018-06-06 DIAGNOSIS — I214 Non-ST elevation (NSTEMI) myocardial infarction: Secondary | ICD-10-CM | POA: Diagnosis not present

## 2018-06-06 DIAGNOSIS — E871 Hypo-osmolality and hyponatremia: Secondary | ICD-10-CM | POA: Diagnosis present

## 2018-06-06 DIAGNOSIS — Z9981 Dependence on supplemental oxygen: Secondary | ICD-10-CM

## 2018-06-06 DIAGNOSIS — E1142 Type 2 diabetes mellitus with diabetic polyneuropathy: Secondary | ICD-10-CM | POA: Diagnosis present

## 2018-06-06 DIAGNOSIS — F411 Generalized anxiety disorder: Secondary | ICD-10-CM | POA: Diagnosis not present

## 2018-06-06 DIAGNOSIS — K5000 Crohn's disease of small intestine without complications: Secondary | ICD-10-CM

## 2018-06-06 DIAGNOSIS — D649 Anemia, unspecified: Secondary | ICD-10-CM

## 2018-06-06 DIAGNOSIS — N179 Acute kidney failure, unspecified: Secondary | ICD-10-CM | POA: Diagnosis present

## 2018-06-06 DIAGNOSIS — Z716 Tobacco abuse counseling: Secondary | ICD-10-CM

## 2018-06-06 DIAGNOSIS — E119 Type 2 diabetes mellitus without complications: Secondary | ICD-10-CM

## 2018-06-06 DIAGNOSIS — I25110 Atherosclerotic heart disease of native coronary artery with unstable angina pectoris: Secondary | ICD-10-CM | POA: Diagnosis present

## 2018-06-06 DIAGNOSIS — F419 Anxiety disorder, unspecified: Secondary | ICD-10-CM | POA: Diagnosis present

## 2018-06-06 DIAGNOSIS — D72829 Elevated white blood cell count, unspecified: Secondary | ICD-10-CM | POA: Diagnosis not present

## 2018-06-06 DIAGNOSIS — B961 Klebsiella pneumoniae [K. pneumoniae] as the cause of diseases classified elsewhere: Secondary | ICD-10-CM | POA: Diagnosis present

## 2018-06-06 DIAGNOSIS — K529 Noninfective gastroenteritis and colitis, unspecified: Secondary | ICD-10-CM | POA: Diagnosis present

## 2018-06-06 DIAGNOSIS — J449 Chronic obstructive pulmonary disease, unspecified: Secondary | ICD-10-CM | POA: Diagnosis present

## 2018-06-06 DIAGNOSIS — Z79899 Other long term (current) drug therapy: Secondary | ICD-10-CM

## 2018-06-06 DIAGNOSIS — E1165 Type 2 diabetes mellitus with hyperglycemia: Secondary | ICD-10-CM | POA: Diagnosis not present

## 2018-06-06 DIAGNOSIS — M21372 Foot drop, left foot: Secondary | ICD-10-CM | POA: Diagnosis present

## 2018-06-06 DIAGNOSIS — G894 Chronic pain syndrome: Secondary | ICD-10-CM

## 2018-06-06 DIAGNOSIS — T380X5A Adverse effect of glucocorticoids and synthetic analogues, initial encounter: Secondary | ICD-10-CM | POA: Diagnosis not present

## 2018-06-06 DIAGNOSIS — Z7983 Long term (current) use of bisphosphonates: Secondary | ICD-10-CM

## 2018-06-06 DIAGNOSIS — F17210 Nicotine dependence, cigarettes, uncomplicated: Secondary | ICD-10-CM | POA: Diagnosis present

## 2018-06-06 DIAGNOSIS — Z8249 Family history of ischemic heart disease and other diseases of the circulatory system: Secondary | ICD-10-CM

## 2018-06-06 DIAGNOSIS — I482 Chronic atrial fibrillation, unspecified: Secondary | ICD-10-CM | POA: Diagnosis present

## 2018-06-06 DIAGNOSIS — N400 Enlarged prostate without lower urinary tract symptoms: Secondary | ICD-10-CM | POA: Diagnosis present

## 2018-06-06 DIAGNOSIS — J441 Chronic obstructive pulmonary disease with (acute) exacerbation: Secondary | ICD-10-CM

## 2018-06-06 DIAGNOSIS — R5381 Other malaise: Secondary | ICD-10-CM | POA: Diagnosis present

## 2018-06-06 LAB — BASIC METABOLIC PANEL
Anion gap: 11 (ref 5–15)
BUN: 65 mg/dL — ABNORMAL HIGH (ref 8–23)
CO2: 24 mmol/L (ref 22–32)
Calcium: 9 mg/dL (ref 8.9–10.3)
Chloride: 102 mmol/L (ref 98–111)
Creatinine, Ser: 2.08 mg/dL — ABNORMAL HIGH (ref 0.61–1.24)
GFR calc Af Amer: 36 mL/min — ABNORMAL LOW (ref >60)
GFR calc non Af Amer: 31 mL/min — ABNORMAL LOW (ref >60)
Glucose, Bld: 191 mg/dL — ABNORMAL HIGH (ref 70–99)
Potassium: 4.8 mmol/L (ref 3.5–5.1)
Sodium: 137 mmol/L (ref 135–145)

## 2018-06-06 LAB — GLUCOSE, CAPILLARY
Glucose-Capillary: 179 mg/dL — ABNORMAL HIGH (ref 70–99)
Glucose-Capillary: 163 mg/dL — ABNORMAL HIGH (ref 70–99)
Glucose-Capillary: 215 mg/dL — ABNORMAL HIGH (ref 70–99)
Glucose-Capillary: 212 mg/dL — ABNORMAL HIGH (ref 70–99)

## 2018-06-06 LAB — URINE CULTURE: Culture: >=100000 — AB

## 2018-06-06 LAB — MAGNESIUM: Magnesium: 2.2 mg/dL (ref 1.7–2.4)

## 2018-06-06 MED ORDER — LEVALBUTEROL HCL 1.25 MG/0.5ML IN NEBU
1.2500 mg | INHALATION_SOLUTION | Freq: Three times a day (TID) | RESPIRATORY_TRACT | Status: DC
  Administered 2018-06-06: 1.25 mg via RESPIRATORY_TRACT
  Filled 2018-06-06: qty 0.5

## 2018-06-06 MED ORDER — ALBUTEROL SULFATE (2.5 MG/3ML) 0.083% IN NEBU
2.5000 mg | INHALATION_SOLUTION | RESPIRATORY_TRACT | Status: DC | PRN
  Filled 2018-06-06: qty 3

## 2018-06-06 MED ORDER — METOPROLOL TARTRATE 25 MG PO TABS: 25.0000 mg | ORAL_TABLET | Freq: Two times a day (BID) | ORAL | Status: DC

## 2018-06-06 MED ORDER — DILTIAZEM HCL 60 MG PO TABS
60.0000 mg | ORAL_TABLET | Freq: Once | ORAL | Status: AC
  Administered 2018-06-06: 60 mg via ORAL
  Filled 2018-06-06: qty 1

## 2018-06-06 MED ORDER — HEPARIN SODIUM (PORCINE) 5000 UNIT/ML IJ SOLN: 5000.0000 [IU] | Freq: Three times a day (TID) | INTRAMUSCULAR | Status: DC

## 2018-06-06 MED ORDER — PREDNISONE 10 MG PO TABS: ORAL_TABLET | ORAL | 0 refills | Status: DC

## 2018-06-06 MED ORDER — HYDRALAZINE HCL 25 MG PO TABS: 25.0000 mg | ORAL_TABLET | Freq: Three times a day (TID) | ORAL | Status: DC

## 2018-06-06 MED ORDER — BETHANECHOL CHLORIDE 10 MG PO TABS
10.0000 mg | ORAL_TABLET | Freq: Three times a day (TID) | ORAL | Status: DC
  Administered 2018-06-06 – 2018-06-07 (×5): 10 mg via ORAL
  Filled 2018-06-06 (×5): qty 1

## 2018-06-06 MED ORDER — INSULIN ASPART 100 UNIT/ML ~~LOC~~ SOLN: 0.0000 [IU] | Freq: Three times a day (TID) | SUBCUTANEOUS | 11 refills | Status: DC

## 2018-06-06 MED ORDER — FUROSEMIDE 40 MG PO TABS: 40.0000 mg | ORAL_TABLET | Freq: Every day | ORAL | 11 refills | Status: DC

## 2018-06-06 MED ORDER — FOLIC ACID 1 MG PO TABS
1.0000 mg | ORAL_TABLET | Freq: Every day | ORAL | Status: DC
  Administered 2018-06-07 – 2018-06-17 (×11): 1 mg via ORAL
  Filled 2018-06-06 (×11): qty 1

## 2018-06-06 MED ORDER — DILTIAZEM HCL ER COATED BEADS 180 MG PO CP24
300.0000 mg | ORAL_CAPSULE | Freq: Every day | ORAL | Status: DC
  Administered 2018-06-07 – 2018-06-17 (×11): 300 mg via ORAL
  Filled 2018-06-06 (×11): qty 1

## 2018-06-06 MED ORDER — FOLIC ACID 1 MG PO TABS: 1.0000 mg | ORAL_TABLET | Freq: Every day | ORAL | Status: DC

## 2018-06-06 MED ORDER — VITAMIN B-1 100 MG PO TABS
100.0000 mg | ORAL_TABLET | Freq: Every day | ORAL | Status: DC
  Administered 2018-06-07 – 2018-06-17 (×11): 100 mg via ORAL
  Filled 2018-06-06 (×11): qty 1

## 2018-06-06 MED ORDER — DILTIAZEM HCL ER 240 MG PO CP24: 240.0000 mg | ORAL_CAPSULE | Freq: Every day | ORAL | Status: DC

## 2018-06-06 MED ORDER — ATORVASTATIN CALCIUM 40 MG PO TABS
40.0000 mg | ORAL_TABLET | Freq: Every day | ORAL | Status: DC
  Administered 2018-06-06 – 2018-06-16 (×11): 40 mg via ORAL
  Filled 2018-06-06 (×11): qty 1

## 2018-06-06 MED ORDER — THIAMINE HCL 100 MG PO TABS: 100.0000 mg | ORAL_TABLET | Freq: Every day | ORAL | Status: DC

## 2018-06-06 MED ORDER — ACETAMINOPHEN 500 MG PO TABS: 500.0000 mg | ORAL_TABLET | Freq: Four times a day (QID) | ORAL | 0 refills | Status: AC | PRN

## 2018-06-06 MED ORDER — METHOCARBAMOL 750 MG PO TABS
750.0000 mg | ORAL_TABLET | Freq: Four times a day (QID) | ORAL | Status: DC | PRN
  Administered 2018-06-10: 17:00:00 750 mg via ORAL
  Filled 2018-06-06: qty 1

## 2018-06-06 MED ORDER — AMIODARONE HCL 200 MG PO TABS
200.0000 mg | ORAL_TABLET | Freq: Every day | ORAL | Status: DC
  Administered 2018-06-07 – 2018-06-17 (×11): 200 mg via ORAL
  Filled 2018-06-06 (×11): qty 1

## 2018-06-06 MED ORDER — HYDRALAZINE HCL 25 MG PO TABS
25.0000 mg | ORAL_TABLET | Freq: Three times a day (TID) | ORAL | Status: DC
  Administered 2018-06-06: 16:00:00 25 mg via ORAL
  Filled 2018-06-06: qty 1

## 2018-06-06 MED ORDER — HEPARIN SODIUM (PORCINE) 5000 UNIT/ML IJ SOLN
5000.0000 [IU] | Freq: Three times a day (TID) | INTRAMUSCULAR | Status: DC
  Administered 2018-06-06 – 2018-06-17 (×31): 5000 [IU] via SUBCUTANEOUS
  Filled 2018-06-06 (×32): qty 1

## 2018-06-06 MED ORDER — DILTIAZEM HCL ER COATED BEADS 120 MG PO CP24: 240.0000 mg | ORAL_CAPSULE | Freq: Every day | ORAL | Status: DC

## 2018-06-06 MED ORDER — IPRATROPIUM BROMIDE 0.02 % IN SOLN
0.5000 mg | Freq: Two times a day (BID) | RESPIRATORY_TRACT | Status: DC
  Administered 2018-06-07: 08:00:00 0.5 mg via RESPIRATORY_TRACT
  Filled 2018-06-06: qty 2.5

## 2018-06-06 MED ORDER — SULFAMETHOXAZOLE-TRIMETHOPRIM 400-80 MG PO TABS
1.0000 | ORAL_TABLET | Freq: Two times a day (BID) | ORAL | Status: AC
  Administered 2018-06-06 – 2018-06-11 (×10): 1 via ORAL
  Filled 2018-06-06 (×13): qty 1

## 2018-06-06 MED ORDER — INSULIN GLARGINE 100 UNIT/ML ~~LOC~~ SOLN: 7.0000 [IU] | Freq: Every day | SUBCUTANEOUS | 11 refills | Status: DC

## 2018-06-06 MED ORDER — ACETAMINOPHEN 325 MG PO TABS: 650.0000 mg | ORAL_TABLET | ORAL | Status: DC

## 2018-06-06 MED ORDER — DILTIAZEM HCL ER 240 MG PO CP24
240.0000 mg | ORAL_CAPSULE | Freq: Every day | ORAL | Status: DC
  Filled 2018-06-06: qty 1

## 2018-06-06 MED ORDER — INSULIN GLARGINE 100 UNIT/ML ~~LOC~~ SOLN
7.0000 [IU] | Freq: Every day | SUBCUTANEOUS | Status: DC
  Administered 2018-06-06 – 2018-06-16 (×11): 7 [IU] via SUBCUTANEOUS
  Filled 2018-06-06 (×13): qty 0.07

## 2018-06-06 MED ORDER — FUROSEMIDE 40 MG PO TABS
40.0000 mg | ORAL_TABLET | Freq: Every day | ORAL | Status: DC
  Administered 2018-06-07 – 2018-06-17 (×11): 40 mg via ORAL
  Filled 2018-06-06 (×9): qty 1
  Filled 2018-06-06: qty 2
  Filled 2018-06-06: qty 1

## 2018-06-06 MED ORDER — ASPIRIN 81 MG PO TBEC: 81.0000 mg | DELAYED_RELEASE_TABLET | Freq: Every day | ORAL | Status: AC

## 2018-06-06 MED ORDER — IPRATROPIUM BROMIDE 0.02 % IN SOLN
0.5000 mg | Freq: Three times a day (TID) | RESPIRATORY_TRACT | Status: DC
  Administered 2018-06-06: 14:00:00 0.5 mg via RESPIRATORY_TRACT
  Filled 2018-06-06: qty 2.5

## 2018-06-06 MED ORDER — PREDNISONE 20 MG PO TABS
60.0000 mg | ORAL_TABLET | Freq: Every day | ORAL | Status: DC
  Administered 2018-06-07 – 2018-06-10 (×4): 60 mg via ORAL
  Filled 2018-06-06 (×4): qty 3

## 2018-06-06 MED ORDER — LEVALBUTEROL HCL 1.25 MG/0.5ML IN NEBU: 1.2500 mg | INHALATION_SOLUTION | Freq: Three times a day (TID) | RESPIRATORY_TRACT | 12 refills | Status: DC

## 2018-06-06 MED ORDER — ALBUTEROL SULFATE (5 MG/ML) 0.5% IN NEBU: 2.5000 mg | INHALATION_SOLUTION | RESPIRATORY_TRACT | Status: DC | PRN

## 2018-06-06 MED ORDER — NITROGLYCERIN 0.4 MG SL SUBL: 0.4000 mg | SUBLINGUAL_TABLET | SUBLINGUAL | 12 refills | Status: AC | PRN

## 2018-06-06 MED ORDER — ASPIRIN EC 81 MG PO TBEC
81.0000 mg | DELAYED_RELEASE_TABLET | Freq: Every day | ORAL | Status: DC
  Administered 2018-06-07 – 2018-06-17 (×11): 81 mg via ORAL
  Filled 2018-06-06 (×11): qty 1

## 2018-06-06 MED ORDER — HYDRALAZINE HCL 10 MG PO TABS
10.0000 mg | ORAL_TABLET | Freq: Three times a day (TID) | ORAL | Status: DC
  Administered 2018-06-06: 10 mg via ORAL
  Filled 2018-06-06 (×2): qty 1

## 2018-06-06 MED ORDER — IPRATROPIUM BROMIDE 0.02 % IN SOLN: 0.5000 mg | Freq: Three times a day (TID) | RESPIRATORY_TRACT | 12 refills | Status: DC

## 2018-06-06 MED ORDER — HYDROCODONE-ACETAMINOPHEN 7.5-325 MG PO TABS
1.0000 | ORAL_TABLET | Freq: Three times a day (TID) | ORAL | Status: DC | PRN
  Administered 2018-06-06 – 2018-06-16 (×27): 1 via ORAL
  Filled 2018-06-06 (×27): qty 1

## 2018-06-06 MED ORDER — LORAZEPAM 0.5 MG PO TABS
0.5000 mg | ORAL_TABLET | Freq: Once | ORAL | Status: AC
  Administered 2018-06-06: 17:00:00 0.5 mg via ORAL
  Filled 2018-06-06: qty 1

## 2018-06-06 MED ORDER — SULFAMETHOXAZOLE-TRIMETHOPRIM 400-80 MG PO TABS: 1.0000 | ORAL_TABLET | Freq: Two times a day (BID) | ORAL | Status: DC

## 2018-06-06 MED ORDER — TAMSULOSIN HCL 0.4 MG PO CAPS
0.4000 mg | ORAL_CAPSULE | Freq: Every day | ORAL | Status: DC
  Administered 2018-06-06 – 2018-06-16 (×11): 0.4 mg via ORAL
  Filled 2018-06-06 (×11): qty 1

## 2018-06-06 MED ORDER — IPRATROPIUM BROMIDE 0.02 % IN SOLN
0.5000 mg | Freq: Three times a day (TID) | RESPIRATORY_TRACT | Status: DC
  Administered 2018-06-06: 20:00:00 0.5 mg via RESPIRATORY_TRACT
  Filled 2018-06-06: qty 2.5

## 2018-06-06 MED ORDER — METOPROLOL TARTRATE 50 MG PO TABS
50.0000 mg | ORAL_TABLET | Freq: Two times a day (BID) | ORAL | Status: DC
  Administered 2018-06-06 – 2018-06-17 (×22): 50 mg via ORAL
  Filled 2018-06-06 (×22): qty 1

## 2018-06-06 MED ORDER — INSULIN ASPART 100 UNIT/ML ~~LOC~~ SOLN
0.0000 [IU] | Freq: Three times a day (TID) | SUBCUTANEOUS | Status: DC
  Administered 2018-06-06: 18:00:00 5 [IU] via SUBCUTANEOUS
  Administered 2018-06-07: 12:00:00 2 [IU] via SUBCUTANEOUS
  Administered 2018-06-07: 18:00:00 3 [IU] via SUBCUTANEOUS
  Administered 2018-06-07 – 2018-06-08 (×2): 5 [IU] via SUBCUTANEOUS
  Administered 2018-06-08 – 2018-06-09 (×2): 2 [IU] via SUBCUTANEOUS
  Administered 2018-06-09: 18:00:00 8 [IU] via SUBCUTANEOUS
  Administered 2018-06-09: 2 [IU] via SUBCUTANEOUS
  Administered 2018-06-10: 08:00:00 5 [IU] via SUBCUTANEOUS
  Administered 2018-06-10: 2 [IU] via SUBCUTANEOUS
  Administered 2018-06-10: 5 [IU] via SUBCUTANEOUS
  Administered 2018-06-11: 3 [IU] via SUBCUTANEOUS
  Administered 2018-06-11: 17:00:00 8 [IU] via SUBCUTANEOUS
  Administered 2018-06-11: 3 [IU] via SUBCUTANEOUS
  Administered 2018-06-12 (×2): 5 [IU] via SUBCUTANEOUS
  Administered 2018-06-12: 2 [IU] via SUBCUTANEOUS
  Administered 2018-06-13: 5 [IU] via SUBCUTANEOUS
  Administered 2018-06-13: 2 [IU] via SUBCUTANEOUS
  Administered 2018-06-14: 13:00:00 3 [IU] via SUBCUTANEOUS
  Administered 2018-06-14: 5 [IU] via SUBCUTANEOUS
  Administered 2018-06-15: 17:00:00 8 [IU] via SUBCUTANEOUS
  Administered 2018-06-15: 5 [IU] via SUBCUTANEOUS
  Administered 2018-06-16: 3 [IU] via SUBCUTANEOUS
  Administered 2018-06-16: 2 [IU] via SUBCUTANEOUS

## 2018-06-06 MED ORDER — SULFAMETHOXAZOLE-TRIMETHOPRIM 400-80 MG PO TABS
1.0000 | ORAL_TABLET | Freq: Two times a day (BID) | ORAL | Status: DC
  Administered 2018-06-06: 13:00:00 1 via ORAL
  Filled 2018-06-06 (×2): qty 1

## 2018-06-06 MED ORDER — SORBITOL 70 % SOLN
30.0000 mL | Freq: Every day | Status: DC | PRN
  Administered 2018-06-08 – 2018-06-09 (×2): 30 mL via ORAL
  Filled 2018-06-06 (×2): qty 30

## 2018-06-06 MED ORDER — TAMSULOSIN HCL 0.4 MG PO CAPS: 0.4000 mg | ORAL_CAPSULE | Freq: Every day | ORAL | Status: DC

## 2018-06-06 MED ORDER — NITROGLYCERIN 0.4 MG SL SUBL
0.4000 mg | SUBLINGUAL_TABLET | SUBLINGUAL | Status: DC | PRN
  Administered 2018-06-07 – 2018-06-08 (×5): 0.4 mg via SUBLINGUAL
  Filled 2018-06-06 (×2): qty 1

## 2018-06-06 MED ORDER — PANTOPRAZOLE SODIUM 40 MG PO TBEC
40.0000 mg | DELAYED_RELEASE_TABLET | Freq: Two times a day (BID) | ORAL | Status: DC
  Administered 2018-06-06 – 2018-06-17 (×22): 40 mg via ORAL
  Filled 2018-06-06 (×22): qty 1

## 2018-06-06 MED ORDER — LORATADINE 10 MG PO TABS
10.0000 mg | ORAL_TABLET | Freq: Every day | ORAL | Status: DC
  Administered 2018-06-07 – 2018-06-17 (×11): 10 mg via ORAL
  Filled 2018-06-06 (×11): qty 1

## 2018-06-06 MED ORDER — DIPHENHYDRAMINE HCL 25 MG PO CAPS: 25.0000 mg | ORAL_CAPSULE | ORAL | Status: DC

## 2018-06-06 MED ORDER — LORATADINE 10 MG PO TABS: 10.0000 mg | ORAL_TABLET | Freq: Every day | ORAL | Status: DC

## 2018-06-06 MED ORDER — SODIUM CHLORIDE 0.9 % IV SOLN: 5.0000 mg/kg | INTRAVENOUS | Status: DC

## 2018-06-06 NOTE — Progress Notes (Addendum)
Occupational Therapy Treatment Patient Details Name: Richard SandiferMichael A Wang MRN: [id number] DOB: 03/27/1945 Today's Date: 06/06/2018    History of present illness Pt admitted to acute from CIR on 4/12 with SOB, afib with RVR. Pt had been participating in rehab since 3/27. PMH: CHF, COPD, crohns disease, DM, afib, GIB, chronic back pain, CAD and foot drop.   OT comments  Pt continues to be anxious that he will have another episode of SOB and insists on using NRB, although Sp02 noted to remain at 92% on RA during grooming at EOB. Pt able to don his socks. Pt declined OOB to chair due to reported back pain. Continue to recommend CIR to complete rehab prior to return home.   Follow Up Recommendations  CIR    Equipment Recommendations  Other (comment)(defer to next venue)    Recommendations for Other Services      Precautions / Restrictions Precautions Precautions: Fall Precaution Comments: pt on 5L NRB with Sp02 of 94%, reduced to 3L at end of session       Mobility Bed Mobility Overal bed mobility: Modified Independent             General bed mobility comments: HOB up  Transfers                 General transfer comment: pt declined OOB to chair citing the chair gives him back pain    Balance Overall balance assessment: Needs assistance   Sitting balance-Leahy Scale: Good Sitting balance - Comments: no LOB donning socks                                   ADL either performed or assessed with clinical judgement   ADL Overall ADL's : Needs assistance/impaired     Grooming: Wash/dry hands;Wash/dry face;Brushing hair;Sitting;Set up               Lower Body Dressing: Set up;Sitting/lateral leans Lower Body Dressing Details (indicate cue type and reason): socks only                     Vision       Perception     Praxis      Cognition Arousal/Alertness: Awake/alert Behavior During Therapy: Anxious Overall Cognitive Status: Within  Functional Limits for tasks assessed                                 General Comments: pt continues to be anxious, wanting to use NRB         Exercises     Shoulder Instructions       General Comments      Pertinent Vitals/ Pain       Pain Assessment: Faces Faces Pain Scale: Hurts a little bit Pain Location: back, abdomen Pain Descriptors / Indicators: Sore Pain Intervention(s): Monitored during session  Home Living   Living Arrangements: Other relatives Available Help at Discharge: Family;Available 24 hours/day Type of Home: Mobile home Home Access: Ramped entrance     Home Layout: One level     Bathroom Shower/Tub: Chief Strategy OfficerTub/shower unit   Bathroom Toilet: Standard Bathroom Accessibility: Yes How Accessible: Accessible via walker        Lives With: Family    Prior Functioning/Environment          Comments: could walk up to 100 feet at baseline  Frequency  Min 2X/week        Progress Toward Goals  OT Goals(current goals can now be found in the care plan section)  Progress towards OT goals: Progressing toward goals  Acute Rehab OT Goals Patient Stated Goal: to feel better OT Goal Formulation: With patient Time For Goal Achievement: 06/19/18 Potential to Achieve Goals: Good  Plan Discharge plan remains appropriate    Co-evaluation                 AM-PAC OT "6 Clicks" Daily Activity     Outcome Measure   Help from another person eating meals?: None Help from another person taking care of personal grooming?: A Little Help from another person toileting, which includes using toliet, bedpan, or urinal?: A Lot Help from another person bathing (including washing, rinsing, drying)?: A Lot Help from another person to put on and taking off regular upper body clothing?: A Little Help from another person to put on and taking off regular lower body clothing?: A Lot 6 Click Score: 16    End of Session    OT Visit Diagnosis:  Unsteadiness on feet (R26.81);Other abnormalities of gait and mobility (R26.89);Pain;Muscle weakness (generalized) (M62.81)   Activity Tolerance Patient tolerated treatment well   Patient Left in bed;with call bell/phone within reach;with bed alarm set   Nurse Communication          Time: [phone] OT Time Calculation (min): 15 min  Charges: OT General Charges $OT Visit: 1 Visit 1 Self care  Martie Round, OTR/L Acute Rehabilitation Services Pager: [phone] Office: [phone]   Evern Bio 06/06/2018, 10:35 AM

## 2018-06-06 NOTE — Consult Note (Signed)
Ref: System, Pcp Not In   Subjective:  Feeling better. Stomach is also settling down. Creatine elevated but similar to yesterday.   Objective:  Vital Signs in the last 24 hours: Temp:  [97.8 F (36.6 C)-99.2 F (37.3 C)] 99.2 F (37.3 C) (04/16 1212) Pulse Rate:  [72-121] 82 (04/16 1212) Cardiac Rhythm: Atrial fibrillation (04/16 0807) Resp:  [10-18] 16 (04/16 1212) BP: (105-133)/(70-104) 116/89 (04/16 1212) SpO2:  [90 %-98 %] 90 % (04/16 1212) Weight:  [77.3 kg] 77.3 kg (04/16 0441)  Physical Exam: BP Readings from Last 1 Encounters:  06/06/18 116/89     Wt Readings from Last 1 Encounters:  06/06/18 77.3 kg    Weight change: -0.6 kg Body mass index is 24.45 kg/m. HEENT: [place]/AT, Eyes- Conjunctiva-Pale pink, Sclera-Non-icteric Neck: No JVD, No bruit, Trachea midline. Lungs:  Clearing, Bilateral. Cardiac:  Irregular rhythm, normal S1 and S2, no S3. II/VI systolic murmur. Abdomen:  Soft, non-tender. BS present. Extremities:  No edema present. No cyanosis. No clubbing. CNS: AxOx2, Cranial nerves grossly intact, moves all 4 extremities.  Skin: Warm and dry.   Intake/Output from previous day: 04/15 0701 - 04/16 0700 In: 807.5 [P.O.:360; I.V.:47.5; IV Piggyback:400] Out: 1025 [Urine:1025]    Lab Results: BMET    Component Value Date/Time   NA 137 06/06/2018 0343   NA 137 06/05/2018 0409   NA 139 06/04/2018 0015   K 4.8 06/06/2018 0343   K 4.4 06/05/2018 0409   K 4.5 06/04/2018 0015   CL 102 06/06/2018 0343   CL 103 06/05/2018 0409   CL 106 06/04/2018 0015   CO2 24 06/06/2018 0343   CO2 20 (L) 06/05/2018 0409   CO2 20 (L) 06/04/2018 0015   GLUCOSE 191 (H) 06/06/2018 0343   GLUCOSE 191 (H) 06/05/2018 0409   GLUCOSE 241 (H) 06/04/2018 0015   BUN 65 (H) 06/06/2018 0343   BUN 52 (H) 06/05/2018 0409   BUN 26 (H) 06/04/2018 0015   CREATININE 2.08 (H) 06/06/2018 0343   CREATININE 2.04 (H) 06/05/2018 0409   CREATININE 1.36 (H) 06/04/2018 0015   CALCIUM 9.0  06/06/2018 0343   CALCIUM 9.2 06/05/2018 0409   CALCIUM 9.1 06/04/2018 0015   GFRNONAA 31 (L) 06/06/2018 0343   GFRNONAA 32 (L) 06/05/2018 0409   GFRNONAA 52 (L) 06/04/2018 0015   GFRAA 36 (L) 06/06/2018 0343   GFRAA 37 (L) 06/05/2018 0409   GFRAA >60 06/04/2018 0015   CBC    Component Value Date/Time   WBC 12.5 (H) 06/03/2018 0845   RBC 3.53 (L) 06/03/2018 0845   HGB 9.1 (L) 06/03/2018 0845   HCT 31.8 (L) 06/03/2018 0845   PLT 197 06/03/2018 0845   MCV 90.1 06/03/2018 0845   MCH 25.8 (L) 06/03/2018 0845   MCHC 28.6 (L) 06/03/2018 0845   RDW 17.0 (H) 06/03/2018 0845   LYMPHSABS 1.2 05/27/2018 0916   MONOABS 0.4 05/27/2018 0916   EOSABS 0.0 05/27/2018 0916   BASOSABS 0.0 05/27/2018 0916   HEPATIC Function Panel Recent Labs    04/27/18 0124 05/20/18 0554  PROT 5.3* 5.9*   HEMOGLOBIN A1C No components found for: HGA1C,  MPG CARDIAC ENZYMES Lab Results  Component Value Date   CKTOTAL 20 (L) 06/01/2018   CKMB 1.3 06/01/2018   TROPONINI 0.52 (HH) 06/03/2018   TROPONINI 0.72 (HH) 06/03/2018   TROPONINI 1.04 (HH) 06/03/2018   BNP No results for input(s): PROBNP in the last 8760 hours. TSH Recent Labs    04/28/18 1052 06/03/18 0012  TSH 0.109* 1.411   CHOLESTEROL No results for input(s): CHOL in the last 8760 hours.  Scheduled Meds: . acetaminophen  650 mg Oral Q8 Weeks   And  . diphenhydrAMINE  25 mg Oral Q8 Weeks  . amiodarone  200 mg Oral Daily  . aspirin EC  81 mg Oral Daily  . atorvastatin  40 mg Oral q1800  . bethanechol  10 mg Oral TID  . diltiazem  240 mg Oral Daily  . folic acid  1 mg Oral Daily  . furosemide  40 mg Intravenous Daily  . heparin injection (subcutaneous)  5,000 Units Subcutaneous Q8H  . hydrALAZINE  25 mg Oral TID  . insulin aspart  0-15 Units Subcutaneous TID WC  . insulin glargine  7 Units Subcutaneous QHS  . ipratropium  0.5 mg Nebulization Q8H  . levalbuterol  1.25 mg Nebulization Q8H  . loratadine  10 mg Oral Daily  .  metoprolol tartrate  25 mg Oral BID  . pantoprazole  40 mg Oral BID  . predniSONE  60 mg Oral Q breakfast  . sulfamethoxazole-trimethoprim  1 tablet Oral Q12H  . tamsulosin  0.4 mg Oral QPC supper  . thiamine  100 mg Oral Daily   Continuous Infusions: . inFLIXimab (REMICADE) infusion 250 mL/hr at 06/05/18 1455   PRN Meds:.acetaminophen, albuterol, HYDROcodone-acetaminophen, methocarbamol, morphine injection, nitroGLYCERIN, ondansetron (ZOFRAN) IV  Assessment/Plan: Acute on chronic respiratory failure with hypoxemia COPD Acute on chronic left systolic heart failure NSTEMI Iron deficiency anemia Acute renal failure on chronic kidney disease Iron deficiency anemia Chronic atrial fibrillation Crohn's colitis  Continue medical treatment   LOS: 4 days    Orpah Cobb  MD  06/06/2018, 1:33 PM

## 2018-06-06 NOTE — Evaluation (Addendum)
Physical Therapy Assessment and Plan  Patient Details  Name: Richard Wang MRN: [id number] Date of Birth: 01/15/1946  PT Diagnosis: Coordination disorder, Difficulty walking, Impaired sensation and Muscle weakness Rehab Potential: Fair ELOS: 10-14 days   Today's Date: 06/07/2018 PT Individual Time: 1100-1113 PT Individual Time Calculation (min): 13 min  and Today's Date: 06/07/2018 PT Missed Time: 47 Minutes Missed Time Reason: Patient fatigue;Other (Comment)(SOB/chest pain)   Problem List:  Patient Active Problem List   Diagnosis Date Noted  . Transaminitis   . Hyperkalemia   . Hyponatremia   . Acute on chronic anemia   . Diabetes mellitus type 2 in nonobese (HCC)   . Acute combined systolic and diastolic congestive heart failure ([place])   . Coronary artery disease involving native coronary artery of native heart with unstable angina pectoris ([place])   . Chronic pain syndrome   . Anxiety state   . Diabetes mellitus type 2, noninsulin dependent ([place])   . FTT (failure to thrive) in adult   . AKI (acute kidney injury) ([place]) 06/03/2018  . 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 Crohn's disease ([place]) 06/03/2018  . Atrial fibrillation with RVR ([place]) 06/02/2018  . Shortness of breath   . Hypokalemia   . Acute blood loss anemia   . Labile blood glucose   . Urinary retention   . Chronic respiratory failure ([place])   . Atrial fibrillation ([place])   . Labile blood pressure   . Essential hypertension   . Steroid-induced hyperglycemia   . Normocytic anemia   . Acute on chronic respiratory failure ([place])   . Leucocytosis   . Hypoalbuminemia due to protein-calorie malnutrition ([place])   . Urinary frequency   . Debility 05/17/2018  . Acute and chronic respiratory failure with hypoxia ([place])   . Acute systolic heart failure ([place])   . Chronic atrial fibrillation   . Acute myocardial infarction, subendocardial infarction, subsequent episode of care ([place])   . Unspecified septicemia(038.9)  ([place])   . Sepsis with acute organ dysfunction ([place])   . Acute and chronic respiratory failure with hypoxia [facility])     Past Medical History:  Past Medical History:  Diagnosis Date  . Acute and chronic respiratory failure with hypoxia ([place])   . Acute myocardial infarction, subendocardial infarction, subsequent episode of care ([place])   . Acute systolic heart failure ([place])   . Atrial fibrillation ([place])   . Chronic atrial fibrillation   . Crohn's colitis ([place])   . Diabetes mellitus without complication ([place])   . Hypertension   . Sepsis with acute organ dysfunction ([place])   . Unspecified septicemia(038.9) [facility])    Past Surgical History:  Past Surgical History:  Procedure Laterality Date  . CAROTID ENDARTERECTOMY Bilateral     Assessment & Plan Clinical Impression: Patient is a 73 year old right-handed male with history of COPD with remote tobacco abuse not on home oxygen at baseline, Crohn's colitis/GI bleed, diabetes mellitus, chronic low back pain with foot drop, CAD with non-STEMI maintained on aspirin, renal insufficiency, atrial fibrillation and hypertension. Patient initially presented to [facility] with increasing shortness of breath and lower extremity edema. Chest x-ray and echocardiogram showed moderate LV systolic dysfunction and moderate to severe MR and moderate pulmonary systolic hypertension consistent with CHF. Patient was started on BiPAP however deteriorated and ended up having to be intubated placed on the ventilator. Patient subsequently complications associated with worsening renal function, congestive heart failure. Attempts at extubation failed and he was transferred to [facility] hospital 04/26/2018 for  ventilation wean. Initially on IV Solu-Medrol transition to POprednisone after ventilation discontinued. Tracheostomy tube was not needed. Patient later extubated successfully and monitored. Continued on prednisone with slow taper. Subcutaneous heparin  for DVT prophylaxis. Remained on aspirin therapy as well as Cardizem for history of atrial fibrillation. Therapy evaluations completed and patient was admitted for a comprehensive rehab program 05/17/2018. Patient with steady progressive gains. Noted on 06/02/2018 around 2 PM developed rapid heart rate nonspecific chest pain increasing shortness of breath and hypoxia. He did receive nitro x2 and morphine 2 mg x 2 with minimal relief. He was saturating well on 8 L of oxygen. Heart rate noted to be in the 160s. Troponin elevated 0.14-1.24. EKG showed rapid rate possibly some worsening heart block. Chest x-ray concerning for mild bilateral pulmonary edema. He was discharged to acute care services for ongoing evaluation 06/02/2018.Cardiology service follow-up Dr. Doylene Canard. Echocardiogram with ejection fraction of 35 to 40% moderately reduced systolic function. His amiodarone was resumed for heart rate control and myocardial ischemia. Intravenous Cardizem was initiated and continued on telemetry with transition to PO diltiazem.Patient has been using nonrebreather mask at 4 L for comfort can use nasal cannula but prefers facemask if possible.He remains on subcutaneous heparin for DVT prophylaxis. His diet has been advanced to a regular consistency. AKI felt to be related to diuresis creatinine 2.08. Metformin was held due to elevations in creatinine.Noted history of Crohn's colitis and did receive Remicade after contacts made to Dr.Wimmer [phone] of [place] did have some bouts of urinary retention maintained on low-dose Urecholine. Klebsiella pneumo UTI completing course of Bactrim. Patient transferred to CIR on 06/06/2018 .   Patient currently requires unknown (however ambulated 100' w/ min assist w/ acute care therapist previous date) with mobility secondary to muscle weakness and muscle joint tightness, decreased cardiorespiratoy endurance and decreased oxygen  support, L foot drop.  Prior to hospitalization, patient was independent  with mobility and lived with Family in a Mobile home home.  Home access is  Ramped entrance.  Patient will benefit from skilled PT intervention to maximize safe functional mobility, minimize fall risk and decrease caregiver burden for planned discharge home with 24 hour supervision.  Anticipate patient will benefit from follow up [place] at discharge.  PT - End of Session Activity Tolerance: Tolerates < 10 min activity with changes in vital signs Endurance Deficit: Yes Endurance Deficit Description: deconditioned/debility, mild increase in work of breathing  PT Assessment Rehab Potential (ACUTE/IP ONLY): Fair PT Barriers to Discharge: Medical stability PT Patient demonstrates impairments in the following area(s): Endurance;Motor;Safety;Sensory PT Transfers Functional Problem(s): Bed Mobility;Floor;Bed to Chair;Car;Furniture PT Locomotion Functional Problem(s): Wheelchair Mobility;Ambulation;Stairs PT Plan PT Intensity: Minimum of 1-2 x/day ,45 to 90 minutes PT Frequency: 5 out of 7 days PT Duration Estimated Length of Stay: 10-14 days PT Treatment/Interventions: Ambulation/gait training;Community reintegration;DME/adaptive equipment instruction;Neuromuscular re-education;Psychosocial support;Stair training;UE/LE Strength taining/ROM;Wheelchair propulsion/positioning;Balance/vestibular training;Discharge planning;Pain management;Functional electrical stimulation;Skin care/wound management;Therapeutic Activities;UE/LE Coordination activities;Therapeutic Exercise;Splinting/orthotics;Patient/family education;Functional mobility training;Disease management/prevention PT Transfers Anticipated Outcome(s): supervision PT Locomotion Anticipated Outcome(s): supervision short distance gait w/ LRAD PT Recommendation Recommendations for Other Services: Neuropsych consult Follow Up Recommendations: Home health PT Patient destination:  Home Equipment Recommended: To be determined  Skilled Therapeutic Intervention  Pt well known by this therapist from previous admission to CIR. Pt in supine upon arrival and breathing was very labored and pt SOB. O2 @ 94%. Pt reports 7/10 chest pain when attempting visit @ 8am. Made RN and PA aware, no further instructions but RN  requesting therapist return after dose of Klonopin given. Per PA, SOB and chest pain likely anxiety related.   Returned @ 11 am for evaluation, pt appeared same as this morning, labored breathing and SOB. Refusing any activity this session 2/2 SOB/pain. Performed bed level mobility w/ supervision including rolling and moving BLE extremities. Increase in SOB w/ any movement and speaking to this therapist. Pt reports he did not feel this way when he ambulated 100' w/ min assist yesterday w/ acute care therapist. Instructed pt in results of PT evaluation based on level last achieved at [facility] and assist required yesterday w/ acute care services. Discussed PT POC, rehab potential, rehab goals, and discharge recommendation. Pt verbalized understanding and in agreement. Ended session in supine, all needs in reach. Made RN aware of pt's status.   PT Evaluation Precautions/Restrictions Precautions Precautions: Fall Precaution Comments: watch O2 and HR Restrictions Weight Bearing Restrictions: No General PT Amount of Missed Time (min): 47 Minutes PT Missed Treatment Reason: Patient fatigue;Other (Comment)(SOB/chest pain) Vital SignsTherapy Vitals Pulse Rate: 80 BP: 108/80 Patient Position (if appropriate): Lying Oxygen Therapy SpO2: (!) 79 %(recovered to 93 with 3L) O2 Device: Room Air O2 Flow Rate (L/min): 2 L/min Patient Activity (if Appropriate): In bed Pain Pain Assessment Pain Scale: 0-10 Pain Score: 0-No pain Pain Type: Other (Comment)(chest pain) Pain Location: Chest Pain Orientation: Mid Pain Onset: On-going Pain Intervention(s): RN made aware Home  Living/Prior Functioning Home Living Available Help at Discharge: Family;Available 24 hours/day Type of Home: Mobile home Home Access: Ramped entrance Home Layout: One level Bathroom Shower/Tub: Chiropodist: Standard Bathroom Accessibility: Yes  Lives With: Family Prior Function Level of Independence: (P) Independent with basic ADLs;Independent with homemaking with ambulation Driving: Yes Vocation: Retired Comments: could walk up to 100 feet at baseline Vision/Perception  Perception Perception: Within Functional Limits Praxis Praxis: Intact  Cognition Overall Cognitive Status: Within Functional Limits for tasks assessed Arousal/Alertness: Awake/alert Orientation Level: Oriented X4 Attention: Selective;Sustained Sustained Attention: Appears intact Selective Attention: Impaired Selective Attention Impairment: Functional complex;Verbal complex;Functional basic;Verbal basic Memory: Appears intact Memory Impairment: Storage deficit Awareness: Appears intact Awareness Impairment: Emergent impairment;Anticipatory impairment Problem Solving: Appears intact Problem Solving Impairment: Functional basic;Functional complex;Verbal complex;Verbal basic Safety/Judgment: Appears intact Sensation Sensation Light Touch: Impaired Detail Peripheral sensation comments: (P) decreased in LEs Light Touch Impaired Details: Impaired RLE;Impaired LLE(distal>proximal) Coordination Gross Motor Movements are Fluid and Coordinated: No Fine Motor Movements are Fluid and Coordinated: Yes Finger Nose Finger Test: (P) WFL Heel Shin Test: impaired bilaterally 2/2 weakness Motor  Motor Motor: Other (comment) Motor - Skilled Clinical Observations: generalized weakness, L foot drop   Mobility Bed Mobility Bed Mobility: Rolling Left;Rolling Right Rolling Right: Supervision/verbal cueing Rolling Left: Supervision/Verbal cueing Supine to Sit: Supervision/Verbal cueing Sit to Supine:  Supervision/Verbal cueing Transfers Transfers: Sit to Stand;Stand to Sit;Stand Pivot Transfers Sit to Stand: Maximal Assistance - Patient 25-49% Stand to Sit: Maximal Assistance - Patient 25-49% Stand Pivot Transfers: Maximal Assistance - Patient 25 - 49% Stand Pivot Transfer Details: Manual facilitation for weight shifting;Manual facilitation for placement;Verbal cues for safe use of DME/AE;Verbal cues for precautions/safety;Verbal cues for technique;Tactile cues for initiation;Tactile cues for posture Transfer (Assistive device): Rolling walker Locomotion  Gait Ambulation: No Gait Gait: No Stairs / Additional Locomotion Stairs: No Wheelchair Mobility Wheelchair Mobility: No  Trunk/Postural Assessment  Cervical Assessment Cervical Assessment: Exceptions to WFL(forwarded head) Thoracic Assessment Thoracic Assessment: Within Functional Limits Lumbar Assessment Lumbar Assessment: Exceptions to WFL(posterior pelvic tilt) Postural Control Postural Control: Within Functional  Limits  Balance Balance Balance Assessed: No Static Sitting Balance Static Sitting - Balance Support: Feet supported;No upper extremity supported Static Sitting - Level of Assistance: 5: Stand by assistance Dynamic Sitting Balance Dynamic Sitting - Balance Support: Feet supported;No upper extremity supported Dynamic Sitting - Level of Assistance: 4: Min assist Sitting balance - Comments: no LOB donning socks Static Standing Balance Static Standing - Balance Support: Bilateral upper extremity supported;During functional activity Static Standing - Level of Assistance: 4: Min assist Extremity Assessment  RUE Assessment RUE Assessment: Exceptions to [facility] Passive Range of Motion (PROM) Comments: WFLs Active Range of Motion (AROM) Comments: WFLs General Strength Comments: 3+/5 LUE Assessment LUE Assessment: Exceptions to [facility] Passive Range of Motion (PROM) Comments: WFLs Active Range of Motion (AROM) Comments:  WFLs General Strength Comments: 3+/5 RLE Assessment RLE Assessment: Exceptions to [facility] Passive Range of Motion (PROM) Comments: WFL, moderate gastroc tightness General Strength Comments: globally 3/5  LLE Assessment LLE Assessment: Exceptions to [facility] Passive Range of Motion (PROM) Comments: WFL, moderate gastroc tightness General Strength Comments: Globally 3/5, except 0/5 dorsiflexion    Refer to Care Plan for Long Term Goals  Recommendations for other services: Neuropsych  Discharge Criteria: Patient will be discharged from PT if patient refuses treatment 3 consecutive times without medical reason, if treatment goals not met, if there is a change in medical status, if patient makes no progress towards goals or if patient is discharged from hospital.  The above assessment, treatment plan, treatment alternatives and goals were discussed and mutually agreed upon: by patient  Maciah Feeback K Ki Corbo 06/07/2018, 11:29 AM

## 2018-06-06 NOTE — H&P (Addendum)
Physical Medicine and Rehabilitation Admission H&P     Chief complaint: Weakness   HPI: Richard SandiferMichael A Mcguffin 73 year old right-handed male with history of COPD with remote tobacco abuse not on home oxygen at baseline, Crohn's colitis/GI bleed, diabetes mellitus, chronic low back pain with foot drop, CAD with non-STEMI maintained on aspirin, renal insufficiency, atrial fibrillation and hypertension.  Patient [facility] with increasing shortness of breath and lower extremity edema.  Chest x-ray and echocardiogram showed moderate LV systolic dysfunction and moderate to severe MR and moderate pulmonary systolic hypertension consistent with CHF.  Patient was started on BiPAP however deteriorated and ended up having to be intubated placed on the ventilator.  Patient subsequently complications associated with worsening renal function, congestive heart failure.  Attempts at extubation failed and he was transferred to [facility] 04/26/2018 for ventilation wean.  Initially on IV Solu-Medrol transition to PO prednisone after ventilation discontinued.  Tracheostomy tube was not needed.  Patient later extubated successfully and monitored.  Continued on prednisone with slow taper.  Subcutaneous heparin for DVT prophylaxis.  Remained on aspirin therapy as well as Cardizem for history of atrial fibrillation.   Therapy evaluations completed and patient was admitted for a comprehensive rehab program 05/17/2018.  Patient with steady progressive gains.  Noted on 06/02/2018 around 2 PM developed rapid heart rate nonspecific chest pain increasing shortness of breath and hypoxia.  He did receive nitro x2 and morphine 2 mg x 2 with minimal relief.  He was saturating well on 8 L of oxygen.  Heart rate noted to be in the 160s.  Troponin elevated 0.14-1.24.  EKG showed rapid rate possibly some worsening heart block.  Chest x-ray concerning for mild bilateral pulmonary edema.  He was discharged to  acute care services for ongoing evaluation 06/02/2018.  Cardiology service follow-up Dr. Algie CofferKadakia.  Echocardiogram with ejection fraction of 35 to 40% moderately reduced systolic function.  His amiodarone was resumed for heart rate control and myocardial ischemia.  Intravenous Cardizem was initiated and continued on telemetry with transition to PO diltiazem. Patient has been using nonrebreather mask at 4 L for comfort can use nasal cannula but prefers facemask if possible. He remains on subcutaneous heparin for DVT prophylaxis.  His diet has been advanced to a regular consistency. AKI felt to be related to diuresis creatinine 2.08. Metformin was held due to elevations in creatinine.Noted history of Crohn's colitis and did receive Remicade after contacts made to Dr.Wimmer [phone] [facility]  Patient did have some bouts of urinary retention maintained on low-dose Urecholine. Klebsiella pneumo UTI completing course of Bactrim. Therapies have been resumed patient is admitted to inpatient rehab service to continue comprehensive rehab.   Review of Systems  Constitutional: Negative for chills and fever.  HENT: Negative for hearing loss.   Eyes: Negative for blurred vision and double vision.  Respiratory: Positive for cough and shortness of breath.   Cardiovascular: Positive for palpitations.  Gastrointestinal: Positive for constipation. Negative for heartburn, nausea and vomiting.  Genitourinary: Positive for urgency. Negative for dysuria, flank pain and hematuria.  Musculoskeletal: Positive for joint pain and myalgias.  Skin: Negative for rash.  Psychiatric/Behavioral: The patient has insomnia.   All other systems reviewed and are negative.       Past Medical History:  Diagnosis Date   Acute and chronic respiratory failure with hypoxia (HCC)     Acute myocardial infarction, subendocardial infarction, subsequent episode of care [facility]: [facility] Rhonda W(HCC)  Acute systolic heart failure  (HCC)     Atrial fibrillation (HCC)     Chronic atrial fibrillation     Crohn's colitis (HCC)     Diabetes mellitus without complication (HCC)     Hypertension     Sepsis with acute organ dysfunction (HCC)     Unspecified septicemia(038.9) (HCC)           Past Surgical History:  Procedure Laterality Date   CAROTID ENDARTERECTOMY Bilateral           Family History  Problem Relation Age of Onset   Bone cancer Brother      Social History:  reports that he has been smoking. He has been smoking about 1.50 packs per day. He has never used smokeless tobacco. He reports previous drug use. No history on file for alcohol. Allergies:      Allergies  Allergen Reactions   Penicillins            Medications Prior to Admission  Medication Sig Dispense Refill   inFLIXimab in sodium chloride 0.9 % Inject 5 mg/kg into the vein every 8 (eight) weeks. Premed with Tylenol 650 and Benadryl 25mg        amiodarone (PACERONE) 200 MG tablet Take 200 mg by mouth daily.       amLODipine (NORVASC) 10 MG tablet Take 10 mg by mouth daily.       atorvastatin (LIPITOR) 40 MG tablet Take 40 mg by mouth.       carvedilol (COREG) 6.25 MG tablet Take 6.25 mg by mouth 2 (two) times daily.       clonazePAM (KLONOPIN) 0.5 MG tablet Take 0.5 mg by mouth 2 (two) times daily as needed for anxiety.       hydrALAZINE (APRESOLINE) 50 MG tablet Take 50 mg by mouth 3 (three) times daily.       HYDROcodone-acetaminophen (NORCO) 7.5-325 MG tablet Take 1 tablet by mouth 3 (three) times daily as needed for pain.       lisinopril (PRINIVIL,ZESTRIL) 10 MG tablet Take 10 mg by mouth daily.       metFORMIN (GLUCOPHAGE) 1000 MG tablet Take 1,000 mg by mouth 2 (two) times daily.       ondansetron (ZOFRAN) 4 MG tablet Take 4 mg by mouth 3 (three) times daily as needed for nausea/vomiting.       pantoprazole (PROTONIX) 40 MG tablet Take 40 mg by mouth daily.       rOPINIRole (REQUIP) 1 MG tablet Take 2-4 mg by  mouth See admin instructions. Take 2mg  in the morning as needed for restless legs and 3mg  to 4mg  one hour prior to bedtime as needed for restless legs.          Drug Regimen Review Drug regimen was reviewed and remains appropriate with no significant issues identified     Home: Home Living Family/patient expects to be discharged to:: Private residence Living Arrangements: Other relatives(brother and sister in law) Available Help at Discharge: Family, Available 24 hours/day Type of Home: Mobile home Home Access: Ramped entrance Home Layout: One level Bathroom Shower/Tub: Engineer, manufacturing systems: Standard Home Equipment: Environmental consultant - 2 wheels, Shower seat  Lives With: FamilyPatient lives with his sister, brother-in-law and niece.  One level home.   Functional History: Prior Function Level of Independence: Independent Comments: could walk up to 100 feet at baseline Independent with basic ADLs and driving.  Ambulating 50 feet. Functional Status:  Mobility: Bed Mobility Overal bed mobility: Modified Independent General  bed mobility comments: HOB upMin mod assist mobility Transfers Overall transfer level: Needs assistance Equipment used: Rolling walker (2 wheeled) Transfers: Sit to/from Stand Sit to Stand: Min assist General transfer comment: steadying assist, pt with posterior lean and blocking knees on bed upon initially standing   ADL: Minimal to moderate assist upper and lower body ADLs ADL Overall ADL's : Needs assistance/impaired Eating/Feeding: Independent, Sitting Grooming: Set up, Sitting Upper Body Bathing: Set up, Sitting Lower Body Bathing: Moderate assistance, Sit to/from stand Upper Body Dressing : Set up, Sitting Lower Body Dressing: Moderate assistance, Sit to/from stand Toilet Transfer: Minimal assistance, RW, +2 for safety/equipment Toileting- Clothing Manipulation and Hygiene: Moderate assistance, Sit to/from stand Functional mobility during ADLs:  Minimal assistance, Rolling walker   Cognition: Cognition Overall Cognitive Status: Within Functional Limits for tasks assessed Orientation Level: Oriented X4 Cognition Arousal/Alertness: Awake/alert Behavior During Therapy: Anxious Overall Cognitive Status: Within Functional Limits for tasks assessed General Comments: pt ruminates on past medical history   Physical Exam: Blood pressure 122/79, pulse 72, temperature 97.8 F (36.6 C), temperature source Oral, resp. rate 13, height  (1.778 m), weight 77.3 kg, SpO2 95 %. Physical Exam  Wearing nonrebreather mask Neurological:  Patient is alert but a bit anxious. Follows commands. Provides his name and age.  Skin:  Skin color is chronically gray   Lungs are clear  Heart tachycardic, no murmurs or extra sounds Chest nontender to palpation Abdomen mildly distended mildly tender to palpation diffusely.  No masses. Extremities no clubbing cyanosis or edema Motor strength is 4+ bilateral deltoid bicep tricep grip 3+ at bilateral hip flexors 4 at bilateral knee extensors 0 left ankle dorsiflexor to minus ankle plantar flexor right ankle 3- at the dorsiflexors and plantar flexors Sensation absent to light touch and proprioception in the left foot and present although diminished on the right   Lab Results Last 48 Hours        Results for orders placed or performed during the hospital encounter of 06/02/18 (from the past 48 hour(s))  Glucose, capillary     Status: Abnormal    Collection Time: 06/04/18  6:21 AM  Result Value Ref Range    Glucose-Capillary 252 (H) 70 - 99 mg/dL  Glucose, capillary     Status: Abnormal    Collection Time: 06/04/18 12:49 PM  Result Value Ref Range    Glucose-Capillary 236 (H) 70 - 99 mg/dL  Glucose, capillary     Status: Abnormal    Collection Time: 06/04/18  5:16 PM  Result Value Ref Range    Glucose-Capillary 181 (H) 70 - 99 mg/dL  Glucose, capillary     Status: Abnormal    Collection Time:  06/04/18  9:48 PM  Result Value Ref Range    Glucose-Capillary 209 (H) 70 - 99 mg/dL  Basic metabolic panel     Status: Abnormal    Collection Time: 06/05/18  4:09 AM  Result Value Ref Range    Sodium 137 135 - 145 mmol/L    Potassium 4.4 3.5 - 5.1 mmol/L    Chloride 103 98 - 111 mmol/L    CO2 20 (L) 22 - 32 mmol/L    Glucose, Bld 191 (H) 70 - 99 mg/dL    BUN 52 (H) 8 - 23 mg/dL    Creatinine, Ser 1.61 (H) 0.61 - 1.24 mg/dL    Calcium 9.2 8.9 - 09.6 mg/dL    GFR calc non Af Amer 32 (L) >60 mL/min    GFR calc  Af Amer 37 (L) >60 mL/min    Anion gap 14 5 - 15      Comment: Performed at [facility], [address] [place], [place] [address]  Magnesium     Status: None    Collection Time: 06/05/18  4:09 AM  Result Value Ref Range    Magnesium 2.4 1.7 - 2.4 mg/dL      Comment: Performed at [facility], [address], [place], [place] [address]  Glucose, capillary     Status: Abnormal    Collection Time: 06/05/18  6:36 AM  Result Value Ref Range    Glucose-Capillary 173 (H) 70 - 99 mg/dL  Glucose, capillary     Status: Abnormal    Collection Time: 06/05/18 11:46 AM  Result Value Ref Range    Glucose-Capillary 159 (H) 70 - 99 mg/dL  Glucose, capillary     Status: Abnormal    Collection Time: 06/05/18  4:58 PM  Result Value Ref Range    Glucose-Capillary 213 (H) 70 - 99 mg/dL  Glucose, capillary     Status: Abnormal    Collection Time: 06/05/18  9:32 PM  Result Value Ref Range    Glucose-Capillary 161 (H) 70 - 99 mg/dL  Basic metabolic panel     Status: Abnormal    Collection Time: 06/06/18  3:43 AM  Result Value Ref Range    Sodium 137 135 - 145 mmol/L    Potassium 4.8 3.5 - 5.1 mmol/L    Chloride 102 98 - 111 mmol/L    CO2 24 22 - 32 mmol/L    Glucose, Bld 191 (H) 70 - 99 mg/dL    BUN 65 (H) 8 - 23 mg/dL    Creatinine, Ser 0.98 (H) 0.61 - 1.24 mg/dL    Calcium 9.0 8.9 - 11.9 mg/dL    GFR calc non Af Amer 31 (L) >60 mL/min    GFR calc Af Amer 36 (L) >60  mL/min    Anion gap 11 5 - 15      Comment: Performed at [facility], [address], [place], [place] [address]  Magnesium     Status: None    Collection Time: 06/06/18  3:43 AM  Result Value Ref Range    Magnesium 2.2 1.7 - 2.4 mg/dL      Comment: Performed at [facility], [address] [place], [place] [address]      Imaging Results (Last 48 hours)  No results found.           Medical Problem List and Plan: 1.  Debility secondary to acute on chronic respiratory failure with hypoxemia and hypercarbia. Wean O2 as tolerated goals oxygen saturation 89-92%Slow prednisone taper. Patient also with peripheral neuropathy left side foot drop/multi-medical 2.  Antithrombotics: -DVT/anticoagulation: Subcutaneous heparin             -antiplatelet therapy: Aspirin 81 mg daily 3. Pain Management/Chronic back pain: Hydrocodone and Robaxin as needed 4. Mood: Provide emotional support             -antipsychotic agents: N/A 5. Neuropsych: This patient is capable of making decisions on his own behalf. 6. Skin/Wound Care: Routine skin checks 7. Fluids/Electrolytes/Nutrition: Routine in and outs with follow-up chemistries 8.  CAD with non-STEMI.  Continue aspirin.Follow-up per  cardiology services 9.  Atrial fibrillation.  Diltiazem 240 mg daily/amiodarone 200 mg daily.  Would like cardiology to continue to monitor his rate control as well as  his CHF Cardiac rate not controlled Spoke with Dr. Algie Coffer, given the patient appeared anxious at the time, was complaining of some shortness of breath but with 100% O2 sats, will try a one-time dose of lorazepam 0.5 mg.  Dr. Algie Coffer plans to see the patient later this evening 10.  Hypertension.  Hydralazine 25 mg 3 times daily 11. Diastolic congestive heart failure. Monitor for any signs of fluid overload.Continue Lasix as directed 12. Urinary retention/Klebsiella pneumo UTI. Urecholine 10 mg 3 times a day, Flomax 0.4 mg daily. complete course of  Bactrim 13. Hyperlipidemia. Lipitor 14. Diabetes mellitus. Latest hemoglobin A1c 5.0. Presently onLantus insulin 7 units daily at bedtime. Patient on metformin 1000 mg twice a day prior to admission but held due to mildly elevated creatinine. Check blood sugars before meals and at bedtime. Monitor with taper of prednisone. Diabetic teaching 15. Crohn's colitis with history of GI bleed. Protonix 40 mg twice a day.Patient did receive Remicade 06/05/2018 and is scheduled every 8 weeks 16. AKI/renal insufficiency. Strict in and out's. Follow-up chemistries. 16. Tobacco abuse. Counseling 17. Acute on chronic anemia. Follow-up CBC  Post Admission Physician Evaluation: 1. Functional deficits secondary  to debility. 2. Patient admitted to receive collaborative, interdisciplinary care between the physiatrist, rehab nursing staff, and therapy team. 3. Patient's level of medical complexity and substantial therapy needs in context of that medical necessity cannot be provided at a lesser intensity of care. 4. Patient has experienced substantial functional loss from his/her baseline. Judging by the patient's diagnosis, physical exam, and functional history, the patient has potential for functional progress which will result in measurable gains while on inpatient rehab.  These gains will be of substantial and practical use upon discharge in facilitating mobility and self-care at the household level. 5. Physiatrist will provide 24 hour management of medical needs as well as oversight of the therapy plan/treatment and provide guidance as appropriate regarding the interaction of the two. 6. 24 hour rehab nursing will assist in the management of  bladder management, bowel management, safety, skin/wound care, disease management, medication administration, pain management and patient education  and help integrate therapy concepts, techniques,education, etc. 7. PT will assess and treat for: pre gait training, gait  training, NM re ed, endurance , safety, and  Equipment.  .  Goals are: independent with assistive device. 8. OT will assess and treat for   ADLs, Cognitive perceptual skills, Neuromuscular re education, safety, endurance, equipment  .  Goals are: supervision.  9. SLP will assess and treat for  .  Goals are: N/A. 10. Case Management and Social Worker will assess and treat for psychological issues and discharge planning. 11. Team conference will be held weekly to assess progress toward goals and to determine barriers to discharge. 12.  Patient will receive at least 3 hours of therapy per day at least 5 days per week. 13. ELOS and Prognosis: 7-10d fair  "I have personally performed a face to face diagnostic evaluation of this patient.  Additionally, I have reviewed and concur with the physician assistant's documentation above."  Erick Colace M.D.  [facility] [id number] (Sports Med, Neuromuscular Med) Diplomate Am Board of Electrodiagnostic Med    Lynnae Prude 06/06/2018

## 2018-06-06 NOTE — Consult Note (Addendum)
HR in 110-130 bpm.  Will adjust Metoprolol and diltiazem doses and decrease hydralazine dose to improve heart rate and blood pressure.  Orpah Cobb, MD 06/06/2018 at 05:32 PM

## 2018-06-06 NOTE — PMR Pre-admission (Signed)
PMR Admission Coordinator Pre-Admission Assessment  Patient: Richard Wang is an 73 y.o., male MRN: [id number] DOB: [birth year] Height: '5\' 10"'  (177.8 cm) Weight: 77.3 kg  Insurance Information HMO:     PPO:      PCP:      IPA:      80/20:      OTHER: no HMO PRIMARY: Medicare a and b      Policy#: [id number]      Subscriber: pt Benefits:  Phone #: passport one online     Name:  Eff. Date: 08/21/2011     Deduct: $1408      Out of Pocket Max: none      Life Max: none CIR: 100%      SNF: 20 full days Outpatient: 80%     Co-Pay: 20% Home Health: 100%      Co-Pay: none DME: 80%     Co-Pay: 20% Providers: pt choice  SECONDARY: none     Medicaid Application Date:       Case Manager:  Disability Application Date:       Case Worker:   The "Data Collection Information Summary" for patients in Inpatient Rehabilitation Facilities with attached "Privacy Act [place] Records" was provided and verbally reviewed with: Patient  Emergency Contact Information Contact Information    Name Relation Home Work Trinity, Andee Poles Niece   [phone]   Onnie Graham Sister   [phone]      Current Medical History  Patient Admitting Diagnosis: Debility  History of Present Illness: 73 year old right-handed male with history of COPD with remote tobacco abuse not on home oxygen at baseline, Crohn's colitis/GI bleed, diabetes mellitus, chronic low back pain with foot drop, CAD with non-STEMI maintained on aspirin, renal insufficiency, atrial fibrillation and hypertension.  Patient initially presented to [facility] with increasing shortness of breath and lower extremity edema.  Chest x-ray and echocardiogram showed moderate LV systolic dysfunction and moderate to severe MR and moderate pulmonary systolic hypertension consistent with CHF.  Patient was started on BiPAP however deteriorated and ended up having to be intubated placed on the ventilator.  Patient subsequently  complications associated with worsening renal function, congestive heart failure.  Attempts at extubation failed and he was transferred to [facility] 04/26/2018 for ventilation wean.    Initially on IV Solu-Medrol transition to PO prednisone after ventilation discontinued.  Tracheostomy tube was not needed.  Patient later extubated successfully and monitored.  Continued on prednisone with slow taper.  Subcutaneous heparin for DVT prophylaxis.  Remained on aspirin therapy as well as Cardizem for history of atrial fibrillation.     Therapy evaluations completed and patient was admitted for a comprehensive rehab program 05/17/2018.  Patient with steady progressive gains.  Noted on 06/02/2018 around 2 PM developed rapid heart rate nonspecific chest pain increasing shortness of breath and hypoxia.  He did receive nitro x2 and morphine 2 mg x 2 with minimal relief.  He was saturating well on 8 L of oxygen.  Heart rate noted to be in the 160s.  Troponin elevated 0.14-1.24.  EKG showed rapid rate possibly some worsening heart block.  Chest x-ray concerning for mild bilateral pulmonary edema.  He was discharged to acute care services for ongoing evaluation 06/02/2018.    Cardiology service follow-up Dr. Doylene Canard.  Echocardiogram with ejection fraction of 35 to 40% moderately reduced systolic function.  His amiodarone was resumed for heart rate control and myocardial ischemia.  Intravenous Cardizem was initiated and  continued on telemetry with transition to PO diltiazem. Patient has been using nonrebreather mask at 4 L for comfort can use nasal cannula but prefers facemask if possible. He remains on subcutaneous heparin for DVT prophylaxis.  His diet has been advanced to a regular consistency. AKI felt to be related to diuresis creatinine 2.08. Metformin was held due to elevations in creatinine.Noted history of Crohn's colitis and did receive Remicade after contacts made to Dr.Wimmer [phone] of [place]  Patient did have some bouts of urinary retention maintained on low-dose Urecholine. Klebsiella pneumo UTI completing course of Bactrim.      Patient's medical record from [facility]  has been reviewed by the rehabilitation admission coordinator and physician.  Past Medical History  Past Medical History:  Diagnosis Date  . Acute and chronic respiratory failure with hypoxia ([place])   . Acute myocardial infarction, subendocardial infarction, subsequent episode of care ([place])   . Acute systolic heart failure ([place])   . Atrial fibrillation ([place])   . Chronic atrial fibrillation   . Crohn's colitis ([place])   . Diabetes mellitus without complication ([place])   . Hypertension   . Sepsis with acute organ dysfunction ([place])   . Unspecified septicemia(038.9) ([place])     Family History   family history includes Bone cancer in his brother.  Prior Rehab/Hospitalizations Has the patient had prior rehab or hospitalizations prior to admission? yes  Has the patient had major surgery during 100 days prior to admission? no  Current Medications  Current Facility-Administered Medications:  .  acetaminophen (TYLENOL) tablet 1,000 mg, 1,000 mg, Oral, Q6H PRN, Gilles Chiquito B, MD .  inFLIXimab (REMICADE) 5 mg/kg = 400 mg in sodium chloride 0.9 % 250 mL infusion, 5 mg/kg, Intravenous, Q8 weeks, Last Rate: 250 mL/hr at 06/05/18 1455 **AND** acetaminophen (TYLENOL) tablet 650 mg, 650 mg, Oral, Q8 Weeks, 650 mg at 06/05/18 1307 **AND** diphenhydrAMINE (BENADRYL) capsule 25 mg, 25 mg, Oral, Q8 Joelyn Oms, MD, 25 mg at 06/05/18 1307 .  albuterol (PROVENTIL) (2.5 MG/3ML) 0.083% nebulizer solution 2.5 mg, 2.5 mg, Nebulization, Q4H PRN, Gilles Chiquito B, MD .  amiodarone (PACERONE) tablet 200 mg, 200 mg, Oral, Daily, Dixie Dials, MD, 200 mg at 06/06/18 0930 .  aspirin EC tablet 81 mg, 81 mg, Oral, Daily, Gilles Chiquito B, MD, 81 mg at 06/06/18 0929 .  atorvastatin (LIPITOR) tablet 40  mg, 40 mg, Oral, q1800, Sid Falcon, MD, 40 mg at 06/05/18 1729 .  bethanechol (URECHOLINE) tablet 10 mg, 10 mg, Oral, TID, Gilles Chiquito B, MD, 10 mg at 06/06/18 0930 .  diltiazem (DILACOR XR) 24 hr capsule 240 mg, 240 mg, Oral, Daily, Dixie Dials, MD, 240 mg at 06/06/18 0930 .  folic acid (FOLVITE) tablet 1 mg, 1 mg, Oral, Daily, Gilles Chiquito B, MD, 1 mg at 06/06/18 0929 .  furosemide (LASIX) injection 40 mg, 40 mg, Intravenous, Daily, Allie Bossier, MD, 40 mg at 06/06/18 0930 .  heparin injection 5,000 Units, 5,000 Units, Subcutaneous, Q8H, Sid Falcon, MD, 5,000 Units at 06/06/18 0704 .  hydrALAZINE (APRESOLINE) tablet 25 mg, 25 mg, Oral, TID, Gilles Chiquito B, MD, 25 mg at 06/06/18 0930 .  HYDROcodone-acetaminophen (NORCO) 7.5-325 MG per tablet 1 tablet, 1 tablet, Oral, TID PRN, Sid Falcon, MD, 1 tablet at 06/06/18 0704 .  insulin aspart (novoLOG) injection 0-15 Units, 0-15 Units, Subcutaneous, TID WC, Schorr, Rhetta Mura, NP, 3 Units at 06/06/18 0709 .  insulin glargine (LANTUS)  injection 7 Units, 7 Units, Subcutaneous, QHS, Sid Falcon, MD, 7 Units at 06/05/18 2205 .  ipratropium (ATROVENT) nebulizer solution 0.5 mg, 0.5 mg, Nebulization, Q8H, Florencia Reasons, MD .  levalbuterol [facility]) nebulizer solution 1.25 mg, 1.25 mg, Nebulization, Q8H, Florencia Reasons, MD .  loratadine (CLARITIN) tablet 10 mg, 10 mg, Oral, Daily, Gilles Chiquito B, MD, 10 mg at 06/06/18 0930 .  methocarbamol (ROBAXIN) tablet 750 mg, 750 mg, Oral, Q6H PRN, Gilles Chiquito B, MD, 750 mg at 06/03/18 0750 .  metoprolol tartrate (LOPRESSOR) tablet 25 mg, 25 mg, Oral, BID, Allie Bossier, MD, 25 mg at 06/06/18 0930 .  morphine 2 MG/ML injection 2 mg, 2 mg, Intravenous, Q2H PRN, Bodenheimer, Charles A, NP, 2 mg at 06/06/18 0428 .  nitroGLYCERIN (NITROSTAT) SL tablet 0.4 mg, 0.4 mg, Sublingual, Q5 min PRN, Gilles Chiquito B, MD .  ondansetron [facility]) injection 4 mg, 4 mg, Intravenous, Q6H PRN, Sid Falcon, MD, 4 mg at  06/05/18 2327 .  pantoprazole (PROTONIX) EC tablet 40 mg, 40 mg, Oral, BID, Gilles Chiquito B, MD, 40 mg at 06/06/18 0930 .  predniSONE (DELTASONE) tablet 60 mg, 60 mg, Oral, Q breakfast, Allie Bossier, MD, 60 mg at 06/06/18 0930 .  sulfamethoxazole-trimethoprim (BACTRIM,SEPTRA) 400-80 MG per tablet 1 tablet, 1 tablet, Oral, Q12H, Florencia Reasons, MD .  tamsulosin [facility]) capsule 0.4 mg, 0.4 mg, Oral, QPC supper, Gilles Chiquito B, MD, 0.4 mg at 06/05/18 1729 .  thiamine (VITAMIN B-1) tablet 100 mg, 100 mg, Oral, Daily, Gilles Chiquito B, MD, 100 mg at 06/06/18 0930  Patients Current Diet:  Diet Order            Diet - low sodium heart healthy        Diet heart healthy/carb modified Room service appropriate? Yes; Fluid consistency: Thin  Diet effective now              Precautions / Restrictions Precautions Precautions: Fall Precaution Comments: pt insistent on 3L NRB, but Sp02 noted to by 92% or above on RA Restrictions Weight Bearing Restrictions: No   Has the patient had 2 or more falls or a fall with injury in the past year? No  Prior Activity Level Limited Community (1-2x/wk): Independent without AD short distances before becoming SOB; drove  Prior Functional Level Self Care: Did the patient need help bathing, dressing, using the toilet or eating? Independent  Indoor Mobility: Did the patient need assistance with walking from room to room (with or without device)? Independent  Stairs: Did the patient need assistance with internal or external stairs (with or without device)? Independent  Functional Cognition: Did the patient need help planning regular tasks such as shopping or remembering to take medications? Independent  Home Assistive Devices / Equipment Home Equipment: Walker - 2 wheels, Shower seat  Prior Device Use: Indicate devices/aids used by the patient prior to current illness, exacerbation or injury? None of the above  Current Functional Level Cognition  Overall  Cognitive Status: Within Functional Limits for tasks assessed Orientation Level: Oriented X4 General Comments: pt ruminates on past medical history    Extremity Assessment (includes Sensation/Coordination)  Upper Extremity Assessment: Overall WFL for tasks assessed  Lower Extremity Assessment: Overall WFL for tasks assessed(Grossly 4-/5)    ADLs  Overall ADL's : Needs assistance/impaired Eating/Feeding: Independent, Sitting Grooming: Set up, Sitting Upper Body Bathing: Set up, Sitting Lower Body Bathing: Moderate assistance, Sit to/from stand Upper Body Dressing : Set up, Sitting Lower Body Dressing: Moderate  assistance, Sit to/from stand Toilet Transfer: Minimal assistance, RW, +2 for safety/equipment Toileting- Clothing Manipulation and Hygiene: Moderate assistance, Sit to/from stand Functional mobility during ADLs: Minimal assistance, Rolling walker    Mobility  Overal bed mobility: Modified Independent General bed mobility comments: HOB up    Transfers  Overall transfer level: Needs assistance Equipment used: Rolling walker (2 wheeled) Transfers: Sit to/from Stand Sit to Stand: Min assist General transfer comment: steadying assist, pt with posterior lean and blocking knees on bed upon initially standing    Ambulation / Gait / Stairs / Office manager / Balance Balance Overall balance assessment: Needs assistance Sitting balance-Leahy Scale: Good Standing balance support: Bilateral upper extremity supported Standing balance-Leahy Scale: Poor Standing balance comment: heavy reliance on B UEs, tremulous LEs, posterior lean    Special needs/care consideration BiPAP/CPAP n/a CPM  N/a Continuous Drip IV n/a Dialysis n/a Life Vest  N/a Oxygen O2 at 4 liters via NRB mask for comfort and pt complains he can not feel oxygen via nasal cannula; was not on home O2. Is moving back and forth between nasal cannula and non rebreather with 4 liters on day of  admission Special Bed  N/a Trach Size  N/a Wound Vac n/a Skin ecchymosis to abdomen and arms bilaterally; MASD to abdomen Bowel mgmt:  LBM 4/13 continent Bladder mgmt:  continent Diabetic mgmt: n/a Behavioral consideration  Anxious; needs frequent reassurance Chemo/radiation  N/a   Previous Home Environment  Living Arrangements: Other relatives  Lives With: Family Available Help at Discharge: Family, Available 24 hours/day Type of Home: Mobile home Home Layout: One level Home Access: Ramped entrance Bathroom Shower/Tub: Chiropodist: Standard Bathroom Accessibility: Yes How Accessible: Accessible via walker [place]: No  Discharge Living Setting Plans for Discharge Living Setting: Lives with (comment) Type of Home at Discharge: Mobile home Discharge Home Layout: One level Discharge Home Access: [place] entrance Discharge Bathroom Shower/Tub: Tub/shower unit Discharge Bathroom Toilet: Standard Discharge Bathroom Accessibility: Yes How Accessible: Accessible via walker Does the patient have any problems obtaining your medications?: No  Social/Family/Support Systems Patient Roles: Parent Contact Information: niece, Marketing executive Anticipated Caregiver: niece and sister Anticipated Caregiver's Contact Information: niece, Danielle Ability/Limitations of Caregiver: this neice does not live with pt and her parents Caregiver Availability: 24/7 Discharge Plan Discussed with Primary Caregiver: Yes Is Caregiver In Agreement with Plan?: Yes Does Caregiver/Family have Issues with Lodging/Transportation while Pt is in Rehab?: No  Goals/Additional Needs Patient/Family Goal for Rehab: Mod I with PT and OT Expected length of stay: ELOS 5 to 7 days Special Service Needs: patietn using O2 4 liters via non rebreather for he feels he can not fell oxygen via Nasal cannula and becomes anxious Pt/Family Agrees to Admission and willing to participate:  Yes Program Orientation Provided & Reviewed with Pt/Caregiver Including Roles  & Responsibilities: Yes  Decrease burden of Care through IP rehab admission: n/a  Possible need for SNF placement upon discharge: not anticipated  Patient Condition: I have reviewed medical records from [facility], spoken with CM, and patient and family member. I met with patient at the bedside for inpatient rehabilitation assessment.  Patient will benefit from ongoing PT and OT, can actively participate in 3 hours of therapy a day 5 days of the week, and can make measurable gains during the admission.  Patient will also benefit from the coordinated team approach during an Inpatient Acute Rehabilitation admission.  The patient will receive  intensive therapy as well as Rehabilitation physician, nursing, social worker, and care management interventions.  Due to bladder management, bowel management, safety, skin/wound care, disease management, medication administration, pain management and patient education the patient requires 24 hour a day rehabilitation nursing.  The patient is currently min to mod assist with mobility and basic ADLs.  Discharge setting and therapy post discharge at home with home health is anticipated.  Patient has agreed to participate in the Acute Inpatient Rehabilitation Program and will admit today.  Preadmission Screen Completed By:  Cleatrice Burke RN MSN  06/06/2018 10:18 AM ______________________________________________________________________   Discussed status with Dr. Letta Pate  on  06/06/2018 at  1012 and received approval for admission today.  Admission Coordinator:  Cleatrice Burke, RN MSN, time  2224 Date  4/ 16/2020  Assessment/Plan: Diagnosis: Debility 1. Does the need for close, 24 hr/day Medical supervision in concert with the patient's rehab needs make it unreasonable for this patient to be served in a less intensive setting? Yes 2. Co-Morbidities requiring  supervision/potential complications: Afib RVR, COPD, CHF,AKI 3. Due to bladder management, bowel management, safety, skin/wound care, disease management, medication administration, pain management and patient education, does the patient require 24 hr/day rehab nursing? Yes 4. Does the patient require coordinated care of a physician, rehab nurse, PT (1-2 hrs/day, 5 days/week) and OT (1-2 hrs/day, 5 days/week) to address physical and functional deficits in the context of the above medical diagnosis(es)? Yes Addressing deficits in the following areas: balance, endurance, locomotion, strength, transferring, bowel/bladder control, bathing, dressing, feeding, grooming, toileting and cognition 5. Can the patient actively participate in an intensive therapy program of at least 3 hrs of therapy 5 days a week? Yes 6. The potential for patient to make measurable gains while on inpatient rehab is good 7. Anticipated functional outcomes upon discharge from inpatients are: supervision PT, supervision OT, n/a SLP 8. Estimated rehab length of stay to reach the above functional goals is: 7d 9. Anticipated D/C setting: Home 10. Anticipated post D/C treatments: [place] therapy 11. Overall Rehab/Functional Prognosis: good  MD Signature: Charlett Blake M.D. [employer] [facility] FAAPM&R (Sports Med, Neuromuscular Med) Diplomate [place] Board of Electrodiagnostic Med

## 2018-06-06 NOTE — Progress Notes (Signed)
To rehab alert patient per bed accompanied by NT; oriented to unit set up; fall precaution done.

## 2018-06-06 NOTE — Progress Notes (Signed)
Physical Therapy Treatment Patient Details Name: Richard Wang MRN: [id number] DOB: [birth year] Today's Date: 06/06/2018    History of Present Illness Pt admitted to acute from CIR on 4/12 with SOB, afib with RVR. Pt had been participating in rehab since 3/27. PMH: CHF, COPD, crohns disease, DM, afib, GIB, chronic back pain, CAD and foot drop.    PT Comments    Pt agreeable to participation, but cautious about mobilizing.  Emphasis on gait training and stamina.     Follow Up Recommendations  CIR     Equipment Recommendations       Recommendations for Other Services       Precautions / Restrictions Precautions Precautions: Fall Precaution Comments: pt on 5L NRB with Sp02 of 94%, reduced to 3L at end of session    Mobility  Bed Mobility Overal bed mobility: Modified Independent             General bed mobility comments: HOB up  Transfers Overall transfer level: Needs assistance Equipment used: Rolling walker (2 wheeled) Transfers: Sit to/from Stand Sit to Stand: Min assist(more mod level from low surfaces)         General transfer comment: cues for hand placement.  assist to help forward.  Ambulation/Gait Ambulation/Gait assistance: Min assist Gait Distance (Feet): 100 Feet Assistive device: Rolling walker (2 wheeled) Gait Pattern/deviations: Step-through pattern Gait velocity: decreased 2/2 chronic back pain   General Gait Details: generally steady, tentative moderate use of the RW, SpO2 on RA no lower than 92% up to 98%   Stairs             Wheelchair Mobility    Modified Rankin (Stroke Patients Only)       Balance Overall balance assessment: Needs assistance   Sitting balance-Leahy Scale: Good Sitting balance - Comments: no LOB donning socks     Standing balance-Leahy Scale: Poor Standing balance comment: reliant on the RW with Bil UE's                            Cognition Arousal/Alertness: Awake/alert Behavior During  Therapy: Anxious Overall Cognitive Status: Within Functional Limits for tasks assessed                                 General Comments: pt continues to be anxious, wanting to use NRB       Exercises      General Comments        Pertinent Vitals/Pain Pain Assessment: Faces Faces Pain Scale: Hurts a little bit Pain Location: back, abdomen Pain Descriptors / Indicators: Sore Pain Intervention(s): Monitored during session    Home Living   Living Arrangements: Other relatives Available Help at Discharge: Family;Available 24 hours/day Type of Home: Mobile home Home Access: Ramped entrance   Home Layout: One level        Prior Function        Comments: could walk up to 100 feet at baseline   PT Goals (current goals can now be found in the care plan section) Acute Rehab PT Goals Patient Stated Goal: to feel better PT Goal Formulation: With patient Time For Goal Achievement: 06/19/18 Potential to Achieve Goals: Fair Progress towards PT goals: Progressing toward goals    Frequency    Min 3X/week      PT Plan Current plan remains appropriate    Co-evaluation  AM-PAC PT "6 Clicks" Mobility   Outcome Measure  Help needed turning from your back to your side while in a flat bed without using bedrails?: A Little Help needed moving from lying on your back to sitting on the side of a flat bed without using bedrails?: A Little Help needed moving to and from a bed to a chair (including a wheelchair)?: A Little Help needed standing up from a chair using your arms (e.g., wheelchair or bedside chair)?: A Lot Help needed to walk in hospital room?: A Little Help needed climbing 3-5 steps with a railing? : A Lot 6 Click Score: 16    End of Session   Activity Tolerance: Patient tolerated treatment well Patient left: in bed;with call bell/phone within reach;with bed alarm set Nurse Communication: Mobility status PT Visit Diagnosis:  Unsteadiness on feet (R26.81)     Time: 2334-3568 PT Time Calculation (min) (ACUTE ONLY): 17 min  Charges:  $Gait Training: 8-22 mins                     06/06/2018  [place] Bing, PT Acute Rehabilitation Services [phone]  (pager) [phone]  (office)   Richard Wang Richard Wang 06/06/2018, 1:46 PM

## 2018-06-06 NOTE — Progress Notes (Signed)
Kirsteins, Luanna Salk, MD  Physician  Physical Medicine and Rehabilitation  PMR Pre-admission  Signed  Date of Service:  06/06/2018 10:04 AM       Related encounter: Admission (Discharged) from 06/02/2018 in [facility] 4E CV SURGICAL PROGRESSIVE CARE      Signed         Show:Clear all _0 Manual_1 Template_2 Copied  Added by: _3 Cristina Gong, RN_4 Charlett Blake, MD  _5 Hover for details PMR Admission Coordinator Pre-Admission Assessment  Patient: Richard Wang is an 73 y.o., male MRN: [id number] DOB: 01/05/1946 Height: _6  (177.8 cm) Weight: 77.3 kg  Insurance Information HMO:     PPO:      PCP:      IPA:      80/20:      OTHER: no HMO PRIMARY: Medicare a and b      Policy#: [id number]      Subscriber: pt Benefits:  Phone #: passport one online     Name:  Eff. Date: 08/21/2011     Deduct: $1408      Out of Pocket Max: none      Life Max: none CIR: 100%      SNF: 20 full days Outpatient: 80%     Co-Pay: 20% Home Health: 100%      Co-Pay: none DME: 80%     Co-Pay: 20% Providers: pt choice  SECONDARY: none     Medicaid Application Date:       Case Manager:  Disability Application Date:       Case Worker:   The "Data Collection Information Summary" for patients in Inpatient Rehabilitation Facilities with attached "Privacy Act [place] Records" was provided and verbally reviewed with: Patient  Emergency Contact Information         Contact Information    Name Relation Home Work Sorento, Andee Poles Niece   [phone]   Onnie Graham Sister   [phone]      Current Medical History  Patient Admitting Diagnosis: Debility  History of Present Illness: 73 year old right-handed male with history of COPD with remote tobacco abuse not on home oxygen at baseline, Crohn's colitis/GI bleed, diabetes mellitus, chronic low back pain with foot drop, CAD with non-STEMI maintained on aspirin, renal insufficiency, atrial fibrillation and  hypertension. Patient initially presented to [facility] with increasing shortness of breath and lower extremity edema. Chest x-ray and echocardiogram showed moderate LV systolic dysfunction and moderate to severe MR and moderate pulmonary systolic hypertension consistent with CHF. Patient was started on BiPAP however deteriorated and ended up having to be intubated placed on the ventilator. Patient subsequently complications associated with worsening renal function, congestive heart failure. Attempts at extubation failed and he was transferred to [facility] hospital 04/26/2018 for ventilation wean.   Initially on IV Solu-Medrol transition to POprednisone after ventilation discontinued. Tracheostomy tube was not needed. Patient later extubated successfully and monitored. Continued on prednisone with slow taper. Subcutaneous heparin for DVT prophylaxis. Remained on aspirin therapy as well as Cardizem for history of atrial fibrillation.   Therapy evaluations completed and patient was admitted for a comprehensive rehab program 05/17/2018. Patient with steady progressive gains. Noted on 06/02/2018 around 2 PM developed rapid heart rate nonspecific chest pain increasing shortness of breath and hypoxia. He did receive nitro x2 and morphine 2 mg x 2 with minimal relief. He was saturating well on 8 L of oxygen. Heart rate noted to be in the 160s. Troponin elevated 0.14-1.24. EKG showed rapid rate possibly some worsening heart block.  Chest x-ray concerning for mild bilateral pulmonary edema. He was discharged to acute care services for ongoing evaluation 06/02/2018.  Cardiology service follow-up Dr. Doylene Canard. Echocardiogram with ejection fraction of 35 to 40% moderately reduced systolic function. His amiodarone was resumed for heart rate control and myocardial ischemia. Intravenous Cardizem was initiated and continued on telemetry with transition to PO diltiazem.Patient has been  using nonrebreather mask at 4 L for comfort can use nasal cannula but prefers facemask if possible.He remains on subcutaneous heparin for DVT prophylaxis. His diet has been advanced to a regular consistency. AKI felt to be related to diuresis creatinine 2.08. Metformin was held due to elevations in creatinine.Noted history of Crohn's colitis and did receive Remicade after contacts made to Dr.Wimmer [phone] of [place] did have some bouts of urinary retention maintained on low-dose Urecholine. Klebsiella pneumo UTI completing course of Bactrim.   Patient's medical record from [facility]  has been reviewed by the rehabilitation admission coordinator and physician.  Past Medical History      Past Medical History:  Diagnosis Date  . Acute and chronic respiratory failure with hypoxia ([place])   . Acute myocardial infarction, subendocardial infarction, subsequent episode of care ([place])   . Acute systolic heart failure ([place])   . Atrial fibrillation ([place])   . Chronic atrial fibrillation   . Crohn's colitis ([place])   . Diabetes mellitus without complication ([place])   . Hypertension   . Sepsis with acute organ dysfunction ([place])   . Unspecified septicemia(038.9) ([place])     Family History   family history includes Bone cancer in his brother.  Prior Rehab/Hospitalizations Has the patient had prior rehab or hospitalizations prior to admission? yes  Has the patient had major surgery during 100 days prior to admission? no  Current Medications  Current Facility-Administered Medications:  .  acetaminophen (TYLENOL) tablet 1,000 mg, 1,000 mg, Oral, Q6H PRN, Gilles Chiquito B, MD .  inFLIXimab (REMICADE) 5 mg/kg = 400 mg in sodium chloride 0.9 % 250 mL infusion, 5 mg/kg, Intravenous, Q8 weeks, Last Rate: 250 mL/hr at 06/05/18 1455 **AND** acetaminophen (TYLENOL) tablet 650 mg, 650 mg, Oral, Q8 Weeks, 650 mg at 06/05/18 1307 **AND**  diphenhydrAMINE (BENADRYL) capsule 25 mg, 25 mg, Oral, Q8 Joelyn Oms, MD, 25 mg at 06/05/18 1307 .  albuterol (PROVENTIL) (2.5 MG/3ML) 0.083% nebulizer solution 2.5 mg, 2.5 mg, Nebulization, Q4H PRN, Gilles Chiquito B, MD .  amiodarone (PACERONE) tablet 200 mg, 200 mg, Oral, Daily, Dixie Dials, MD, 200 mg at 06/06/18 0930 .  aspirin EC tablet 81 mg, 81 mg, Oral, Daily, Gilles Chiquito B, MD, 81 mg at 06/06/18 0929 .  atorvastatin (LIPITOR) tablet 40 mg, 40 mg, Oral, q1800, Sid Falcon, MD, 40 mg at 06/05/18 1729 .  bethanechol (URECHOLINE) tablet 10 mg, 10 mg, Oral, TID, Gilles Chiquito B, MD, 10 mg at 06/06/18 0930 .  diltiazem (DILACOR XR) 24 hr capsule 240 mg, 240 mg, Oral, Daily, Dixie Dials, MD, 240 mg at 06/06/18 0930 .  folic acid (FOLVITE) tablet 1 mg, 1 mg, Oral, Daily, Gilles Chiquito B, MD, 1 mg at 06/06/18 0929 .  furosemide (LASIX) injection 40 mg, 40 mg, Intravenous, Daily, Allie Bossier, MD, 40 mg at 06/06/18 0930 .  heparin injection 5,000 Units, 5,000 Units, Subcutaneous, Q8H, Sid Falcon, MD, 5,000 Units at 06/06/18 0704 .  hydrALAZINE (APRESOLINE) tablet 25 mg, 25 mg, Oral, TID, Gilles Chiquito B, MD, 25 mg at 06/06/18 0930 .  HYDROcodone-acetaminophen (  NORCO) 7.5-325 MG per tablet 1 tablet, 1 tablet, Oral, TID PRN, Sid Falcon, MD, 1 tablet at 06/06/18 0704 .  insulin aspart (novoLOG) injection 0-15 Units, 0-15 Units, Subcutaneous, TID WC, Schorr, Rhetta Mura, NP, 3 Units at 06/06/18 0709 .  insulin glargine (LANTUS) injection 7 Units, 7 Units, Subcutaneous, QHS, Sid Falcon, MD, 7 Units at 06/05/18 2205 .  ipratropium (ATROVENT) nebulizer solution 0.5 mg, 0.5 mg, Nebulization, Q8H, Florencia Reasons, MD .  levalbuterol [facility]) nebulizer solution 1.25 mg, 1.25 mg, Nebulization, Q8H, Florencia Reasons, MD .  loratadine (CLARITIN) tablet 10 mg, 10 mg, Oral, Daily, Gilles Chiquito B, MD, 10 mg at 06/06/18 0930 .  methocarbamol (ROBAXIN) tablet 750 mg, 750 mg, Oral, Q6H PRN, Gilles Chiquito B, MD, 750 mg at 06/03/18 0750 .  metoprolol tartrate (LOPRESSOR) tablet 25 mg, 25 mg, Oral, BID, Allie Bossier, MD, 25 mg at 06/06/18 0930 .  morphine 2 MG/ML injection 2 mg, 2 mg, Intravenous, Q2H PRN, Bodenheimer, Charles A, NP, 2 mg at 06/06/18 0428 .  nitroGLYCERIN (NITROSTAT) SL tablet 0.4 mg, 0.4 mg, Sublingual, Q5 min PRN, Gilles Chiquito B, MD .  ondansetron [facility]) injection 4 mg, 4 mg, Intravenous, Q6H PRN, Sid Falcon, MD, 4 mg at 06/05/18 2327 .  pantoprazole (PROTONIX) EC tablet 40 mg, 40 mg, Oral, BID, Gilles Chiquito B, MD, 40 mg at 06/06/18 0930 .  predniSONE (DELTASONE) tablet 60 mg, 60 mg, Oral, Q breakfast, Allie Bossier, MD, 60 mg at 06/06/18 0930 .  sulfamethoxazole-trimethoprim (BACTRIM,SEPTRA) 400-80 MG per tablet 1 tablet, 1 tablet, Oral, Q12H, Florencia Reasons, MD .  tamsulosin [facility]) capsule 0.4 mg, 0.4 mg, Oral, QPC supper, Gilles Chiquito B, MD, 0.4 mg at 06/05/18 1729 .  thiamine (VITAMIN B-1) tablet 100 mg, 100 mg, Oral, Daily, Gilles Chiquito B, MD, 100 mg at 06/06/18 0930  Patients Current Diet:     Diet Order                  Diet - low sodium heart healthy         Diet heart healthy/carb modified Room service appropriate? Yes; Fluid consistency: Thin  Diet effective now               Precautions / Restrictions Precautions Precautions: Fall Precaution Comments: pt insistent on 3L NRB, but Sp02 noted to by 92% or above on RA Restrictions Weight Bearing Restrictions: No   Has the patient had 2 or more falls or a fall with injury in the past year? No  Prior Activity Level Limited Community (1-2x/wk): Independent without AD short distances before becoming SOB; drove  Prior Functional Level Self Care: Did the patient need help bathing, dressing, using the toilet or eating? Independent  Indoor Mobility: Did the patient need assistance with walking from room to room (with or without device)? Independent  Stairs: Did the patient  need assistance with internal or external stairs (with or without device)? Independent  Functional Cognition: Did the patient need help planning regular tasks such as shopping or remembering to take medications? Independent  Home Assistive Devices / Equipment Home Equipment: Walker - 2 wheels, Shower seat  Prior Device Use: Indicate devices/aids used by the patient prior to current illness, exacerbation or injury? None of the above  Current Functional Level Cognition  Overall Cognitive Status: Within Functional Limits for tasks assessed Orientation Level: Oriented X4 General Comments: pt ruminates on past medical history    Extremity Assessment (includes Sensation/Coordination)  Upper  Extremity Assessment: Overall WFL for tasks assessed  Lower Extremity Assessment: Overall WFL for tasks assessed(Grossly 4-/5)    ADLs  Overall ADL's : Needs assistance/impaired Eating/Feeding: Independent, Sitting Grooming: Set up, Sitting Upper Body Bathing: Set up, Sitting Lower Body Bathing: Moderate assistance, Sit to/from stand Upper Body Dressing : Set up, Sitting Lower Body Dressing: Moderate assistance, Sit to/from stand Toilet Transfer: Minimal assistance, RW, +2 for safety/equipment Toileting- Clothing Manipulation and Hygiene: Moderate assistance, Sit to/from stand Functional mobility during ADLs: Minimal assistance, Rolling walker    Mobility  Overal bed mobility: Modified Independent General bed mobility comments: HOB up    Transfers  Overall transfer level: Needs assistance Equipment used: Rolling walker (2 wheeled) Transfers: Sit to/from Stand Sit to Stand: Min assist General transfer comment: steadying assist, pt with posterior lean and blocking knees on bed upon initially standing    Ambulation / Gait / Stairs / Office manager / Balance Balance Overall balance assessment: Needs assistance Sitting balance-Leahy Scale: Good Standing  balance support: Bilateral upper extremity supported Standing balance-Leahy Scale: Poor Standing balance comment: heavy reliance on B UEs, tremulous LEs, posterior lean    Special needs/care consideration BiPAP/CPAP n/a CPM  N/a Continuous Drip IV n/a Dialysis n/a Life Vest  N/a Oxygen O2 at 4 liters via NRB mask for comfort and pt complains he can not feel oxygen via nasal cannula; was not on home O2. Is moving back and forth between nasal cannula and non rebreather with 4 liters on day of admission Special Bed  N/a Trach Size  N/a Wound Vac n/a Skin ecchymosis to abdomen and arms bilaterally; MASD to abdomen Bowel mgmt:  LBM 4/13 continent Bladder mgmt:  continent Diabetic mgmt: n/a Behavioral consideration  Anxious; needs frequent reassurance Chemo/radiation  N/a   Previous Home Environment  Living Arrangements: Other relatives  Lives With: Family Available Help at Discharge: Family, Available 24 hours/day Type of Home: Mobile home Home Layout: One level Home Access: Ramped entrance Bathroom Shower/Tub: Chiropodist: Standard Bathroom Accessibility: Yes How Accessible: Accessible via walker [place]: No  Discharge Living Setting Plans for Discharge Living Setting: Lives with (comment) Type of Home at Discharge: Mobile home Discharge Home Layout: One level Discharge Home Access: [place] entrance Discharge Bathroom Shower/Tub: Tub/shower unit Discharge Bathroom Toilet: Standard Discharge Bathroom Accessibility: Yes How Accessible: Accessible via walker Does the patient have any problems obtaining your medications?: No  Social/Family/Support Systems Patient Roles: Parent Contact Information: niece, Marketing executive Anticipated Caregiver: niece and sister Anticipated Caregiver's Contact Information: niece, Danielle Ability/Limitations of Caregiver: this neice does not live with pt and her parents Caregiver Availability: 24/7 Discharge  Plan Discussed with Primary Caregiver: Yes Is Caregiver In Agreement with Plan?: Yes Does Caregiver/Family have Issues with Lodging/Transportation while Pt is in Rehab?: No  Goals/Additional Needs Patient/Family Goal for Rehab: Mod I with PT and OT Expected length of stay: ELOS 5 to 7 days Special Service Needs: patietn using O2 4 liters via non rebreather for he feels he can not fell oxygen via Nasal cannula and becomes anxious Pt/Family Agrees to Admission and willing to participate: Yes Program Orientation Provided & Reviewed with Pt/Caregiver Including Roles  & Responsibilities: Yes  Decrease burden of Care through IP rehab admission: n/a  Possible need for SNF placement upon discharge: not anticipated  Patient Condition: I have reviewed medical records from [facility], spoken with CM, and patient and family member. I met with patient at  the bedside for inpatient rehabilitation assessment.  Patient will benefit from ongoing PT and OT, can actively participate in 3 hours of therapy a day 5 days of the week, and can make measurable gains during the admission.  Patient will also benefit from the coordinated team approach during an Inpatient Acute Rehabilitation admission.  The patient will receive intensive therapy as well as Rehabilitation physician, nursing, social worker, and care management interventions.  Due to bladder management, bowel management, safety, skin/wound care, disease management, medication administration, pain management and patient education the patient requires 24 hour a day rehabilitation nursing.  The patient is currently min to mod assist with mobility and basic ADLs.  Discharge setting and therapy post discharge at home with home health is anticipated.  Patient has agreed to participate in the Acute Inpatient Rehabilitation Program and will admit today.  Preadmission Screen Completed By:  Cleatrice Burke RN MSN  06/06/2018 10:18 AM  ______________________________________________________________________   Discussed status with Dr. Letta Pate  on  06/06/2018 at  1012 and received approval for admission today.  Admission Coordinator:  Cleatrice Burke, RN MSN, time  4315 Date  4/ 16/2020  Assessment/Plan: Diagnosis: Debility 1. Does the need for close, 24 hr/day Medical supervision in concert with the patient's rehab needs make it unreasonable for this patient to be served in a less intensive setting? Yes 2. Co-Morbidities requiring supervision/potential complications: Afib RVR, COPD, CHF,AKI 3. Due to bladder management, bowel management, safety, skin/wound care, disease management, medication administration, pain management and patient education, does the patient require 24 hr/day rehab nursing? Yes 4. Does the patient require coordinated care of a physician, rehab nurse, PT (1-2 hrs/day, 5 days/week) and OT (1-2 hrs/day, 5 days/week) to address physical and functional deficits in the context of the above medical diagnosis(es)? Yes Addressing deficits in the following areas: balance, endurance, locomotion, strength, transferring, bowel/bladder control, bathing, dressing, feeding, grooming, toileting and cognition 5. Can the patient actively participate in an intensive therapy program of at least 3 hrs of therapy 5 days a week? Yes 6. The potential for patient to make measurable gains while on inpatient rehab is good 7. Anticipated functional outcomes upon discharge from inpatients are: supervision PT, supervision OT, n/a SLP 8. Estimated rehab length of stay to reach the above functional goals is: 7d 9. Anticipated D/C setting: Home 10. Anticipated post D/C treatments: [place] therapy 11. Overall Rehab/Functional Prognosis: good  MD Signature: Charlett Blake M.D. [facility] FAAPM&R (Sports Med, Neuromuscular Med) Diplomate Am Board of Electrodiagnostic Med         Revision History

## 2018-06-06 NOTE — Plan of Care (Signed)
  Problem: RH BOWEL ELIMINATION Goal: RH STG MANAGE BOWEL WITH ASSISTANCE Description STG Manage Bowel with Assistance.mod assistance  Outcome: Not Progressing; LBM 4/13 pt hx of chrons ds.

## 2018-06-06 NOTE — Discharge Summary (Signed)
Discharge Summary  Richard Wang ZOX:[id number] DOB: [birth year]  PCP: System, Pcp Not In  Admit date: 06/02/2018 Discharge date: 06/06/2018  Time spent: , more than 50% time spent on coordination of care.  Patient is being discharged to inpatient rehab  Recommendations for Outpatient Follow-up after discharged from inaptient:  1. F/u with PCP within a week  for hospital discharge follow up, repeat cbc/bmp at follow up 2. 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    Discharge Condition: stable  Diet recommendation: heart healthy/carb modified  Filed Weights   06/04/18 0400 06/05/18 0423 06/06/18 0441  Weight: 76.2 kg 77.9 kg 77.3 kg    History of present illness: (pe admitting MD Dr Criselda Peaches) Patient coming from: [facility]  Chief Complaint: Chest pain  HPI: Richard Wang is a 73 y.o. male with medical history significant of COPD, HTN, DM2, Crohn's disease, Afib, CHF (No TTE in our system), CAD, foot drop, low back pain on chronic narcotics who is in rehab after a hospitalization at [facility] for acute on chronic respiratory failure due to CHF and COPD.  He was transitioned to Rehab on no oxygen.  Today, he was intermittently SOB and around 2pm this afternoon developed a rapid heart rate, chest pain, SOB and hypoxia.  He reports that the chest  pain is central, somewhat reproducible, associated with radiation down both arms and SOB.  He was somewhat gray when I entered the room at first, however, he was not diaphoretic.  He has had an MI before and he did not feel this pain was similar, but he was worried.  He further had palpitations.  He had had nitro X 2 and morphine  X 2.  The morphine helped somewhat with the pain.  He was saturating well on 8L of oxygen.  He had a HR of 160s which improved to 140s with morphine and then to 120s-130s with 2.5mg  of metoprolol IV push.  His home meds list amiodarone and carvedilol, however, currently he is on metoprolol 12.5mg  BID and Cardizem  BID.  His cardizem was increased to TID this AM as his HR was noted to be getting worse and he was started on aspirin.  He is not currently on an anticoagulant as he has had issues with severe bleeding in the past, needed frequent blood transfusions.  He would rather not be on an anticoagulant at this time, however, he is okay with aspirin.  His GI bleeding is thought to be due to his Crohn's disease.  He specifically denied fever, chills, nausea, vomiting, dysuria, change in urine.    When I evaluated the patient in the Rehab unit, he had an elevated HR, was breathing heavily and was being cared for by RRT nursing.  WBC was 16.7, up from 9.1 yesterday.  K was improved to 4.1.  Magnesium will be added on.  Initial troponin was <0.03.  EKG was reviewed and showed rapid rate, possibly some worsening heart block.  Will need to repeat once rate is improved.  CXR done showed mild pulmonary edema.    Hospital Course:  Active Problems:   Acute and chronic respiratory failure with hypoxia (HCC)   Acute systolic heart failure (HCC)   Atrial fibrillation (HCC)   Essential hypertension   Hypokalemia   Atrial fibrillation with RVR (HCC)   Shortness of breath   AKI (acute kidney injury) (HCC)   Steroid-dependent COPD (HCC)   Crohn's disease (HCC)   Diabetes mellitus  type 2, noninsulin dependent (HCC)   FTT (failure to thrive) in adult   Afib/RVR (presenting symptom) -patient is transferred from inpatient rehab to [facility] due to c/o sob, found to be in afib/rvr -he is not a candidate for chronic anticoagulation due to h/o gi bleed/chron's diease , he was last hospitalized for gi bleed 6months ago -cardiology  ((Dr Algie Coffer) consulted for afib management, he remains in afib, rate is better controlled on metoprolol/cardizem/amiodarone /asa 81mg    Acute systolic chf: -patient c/o worsening of sob on 4/13 -cxr "Findings are consistent with bilateral pulmonary edema, slightly progressed at the left lung base." -echo on 4/13 showed "moderately reduced systolic function, with an ejection fraction of 35-40%." -he is started on lasix 40mg  daily on 4/14, he is discharged on oral lasix 40mg  daily -monitor bp/cr -cardiology (Dr Algie Coffer) consulted, he will need to follow up with cardiology once discharged  Acute hypoxic respiratory failure (presenting symptom) He is not on home o2 at baseline Hypoxia likely due to afib/chf/copd Wean o2 as tolerated, goal of SPO2 89 to 92%  AKI on CKDII -from lasix? -urine culture + kleb pneumo (ampicciline and nitrofurantoin resistant),  denies urinary symptom, will treat with bactrim x5 days ( h/o pcn allergy) -monitor renal function, renal dosing meds Hold home meds lisinopril and metformin, consider resume if renal function improves  Anemia of chronic disease -h/o gi bleed from chron's, currently on overt blood loss -hgb stable at 9  Leukocytosis: No fever From steroid? Blood culture no growth urine culture + kleb pneumo (ampicciline and nitrofurantoin resistant), patient denies urinary symptom, will treat with bactrim x5 days ( h/o pcn allergy)  COPD, baseline not o2 dependent, but does has baseline DOE -Patient states stopped smoking when he was admitted to hospital. He reports started smoke cigarette since  he was 73 yr old - Titrate O2 to maintain SPO2 89 to 92% - Xopenex/atroven q8hrs  noninsulin dependent DM2, Home meds metformin held, please consider resume metformin in 1-2 weeks if renal function stable and improves a1c 5 Hyperglycemia likely due to steroids On ssi and lantus  Crohn's disease -4/14 patient with flare with ab pain, no diarrhea, no hematochezia, no pus in stool -he received Solu-Medrol, then oral prednisone -he reports was started on remicade 6months ago, he gets it every two weeks by Dr Benay Pike oncology, last does was more than a months ago, he wants to get it here. Patient can not remember his gi doctor's name. -appreciate pharmacy assistance , he received remicade on 4/15  Low back pain with left leg weakness/left foot drop chronic  FTT/Prolonged hospitalization, will benefit inpatient rehab He was hospitalized at [facility] was intubated twice there, eventually discharge to inpatient rehab at [facility] He was then hospitalized at [facility] for afib/rvr/sob/pumonary edema, baseline not o2 dependent  Per niece, patient was independent,  driving himself to appointments prior to [facility] hospitalization, Prior to that he was hospitalized at [facility] for gi bleed 6months ago  Code Status: full  Family Communication: patient , niece tammy over the phone [phone]  Disposition Plan: to inpatient rehab   Consultants:  Cardiology  CIR  Procedures:  none  Antibiotics:  Bactrim renal dosing x5 days for possible uti   Discharge Exam: BP (!) 126/104 (BP Location: Left Arm)    Pulse (!) 110    Temp 97.8 F (36.6 C) (Oral)    Resp 17    Ht  (1.778 m)    Wt 77.3 kg    SpO2 95%    BMI 24.45 kg/m   General: * Cardiovascular: * Respiratory: *  Discharge Instructions You were cared for by a hospitalist during your hospital stay. If you have any questions about your discharge medications or the care you received while you were in  the hospital after you are discharged, you can call the unit and asked to speak with the hospitalist on call if the hospitalist that took care of you is not available. Once you are discharged, your primary care physician will handle any further medical issues. Please note that NO REFILLS for any discharge medications will be authorized once you are discharged, as it is imperative that you return to your primary care physician (or establish a relationship with a primary care physician if you do not have one) for your aftercare needs so that they can reassess your need for medications and monitor your lab values.  Discharge Instructions    Diet - low sodium heart healthy   Complete by:  As directed    Carb modified diet   Increase activity slowly   Complete by:  As directed      Allergies as of 06/06/2018      Reactions   Penicillins       Medication List    STOP taking these medications   amLODipine 10 MG tablet Commonly known as:  NORVASC   carvedilol 6.25 MG tablet Commonly known as:  COREG   lisinopril 10 MG tablet Commonly known as:  PRINIVIL,ZESTRIL   metFORMIN 1000 MG tablet Commonly known as:  GLUCOPHAGE     TAKE these medications   acetaminophen 500 MG tablet Commonly known as:  TYLENOL Take 1 tablet (500 mg total) by mouth every 6 (six) hours as needed for moderate pain.   amiodarone 200 MG tablet Commonly known as:  PACERONE Take 200 mg by mouth daily.   aspirin 81 MG EC tablet Take 1 tablet (81 mg total) by mouth daily. Start taking on:  June 07, 2018   atorvastatin 40 MG tablet Commonly known as:  LIPITOR Take 40 mg by mouth.   clonazePAM 0.5 MG tablet Commonly known as:  KLONOPIN Take 0.5 mg by mouth 2 (two) times daily as needed for anxiety.   diltiazem 240 MG 24 hr capsule Commonly known as:  DILACOR XR Take 1 capsule (240 mg total) by mouth daily. Start taking on:  June 07, 2018   folic acid 1 MG tablet Commonly known as:  FOLVITE Take 1  tablet (1 mg total) by mouth daily. Start taking on:  June 07, 2018   furosemide 40 MG tablet Commonly known as:  Lasix Take 1 tablet (40 mg total) by mouth daily.   hydrALAZINE 25 MG tablet Commonly known as:  APRESOLINE Take 1 tablet (25 mg total) by mouth 3 (three) times daily. What changed:  medication strength  how much to take   HYDROcodone-acetaminophen 7.5-325 MG tablet Commonly known as:  NORCO Take 1 tablet by mouth 3 (three) times daily as needed for pain.   inFLIXimab in sodium chloride 0.9 % Inject 5 mg/kg into the vein every 8 (eight) weeks. Premed with Tylenol 650 and Benadryl    insulin aspart 100 UNIT/ML injection Commonly known as:  novoLOG Inject 0-15 Units into the skin 3 (three) times daily with meals.   insulin glargine 100 UNIT/ML injection Commonly known as:  LANTUS Inject 0.07 mLs (7 Units total) into the skin at bedtime.   ipratropium 0.02 % nebulizer solution Commonly known as:  ATROVENT Take 2.5 mLs (0.5 mg total) by nebulization every 8 (eight) hours.   levalbuterol 1.25 MG/0.5ML nebulizer solution Commonly known as:  XOPENEX Take 1.25 mg by nebulization every 8 (eight) hours.   loratadine 10 MG tablet Commonly known as:  CLARITIN Take 1 tablet (10 mg total) by mouth daily. Start taking on:  June 07, 2018   metoprolol tartrate 25 MG tablet Commonly known as:  LOPRESSOR Take 1 tablet (25 mg total) by mouth 2 (two) times daily.   nitroGLYCERIN 0.4 MG SL tablet Commonly known as:  NITROSTAT Place 1 tablet (0.4 mg total) under the tongue every 5 (five) minutes as needed for chest pain.   ondansetron 4 MG tablet Commonly known as:  ZOFRAN Take 4 mg by mouth 3 (three) times daily as needed for nausea/vomiting.   pantoprazole 40 MG tablet Commonly known as:  PROTONIX Take 40 mg by mouth daily.   predniSONE 10 MG tablet Commonly known as:  DELTASONE Take  on day one, then  on day two, then  on day three, then   on day four, then stop.   rOPINIRole 1 MG tablet Commonly known as:  REQUIP Take 2-4 mg by mouth See admin instructions. Take  in the morning as needed for restless legs and  to  one hour prior to bedtime as needed for restless legs.   sulfamethoxazole-trimethoprim 400-80 MG tablet Commonly known as:  BACTRIM,SEPTRA Take 1 tablet by mouth every 12 (twelve) hours for 5 days.   tamsulosin 0.4 MG Caps capsule Commonly known as:  FLOMAX Take 1 capsule (0.4 mg total) by mouth daily after supper.   thiamine 100 MG tablet Take 1 tablet (100 mg total) by mouth daily. Start taking on:  June 07, 2018      Allergies  Allergen Reactions   Penicillins       The results of significant diagnostics from this hospitalization (including imaging, microbiology, ancillary and laboratory) are listed below for reference.    Significant Diagnostic Studies: Dg Abd 1 View  Result Date: 05/12/2018 CLINICAL DATA:  NG tube placement EXAM: ABDOMEN - 1 VIEW COMPARISON:  05/11/2018 FINDINGS: 1628 hours. The tip of the NG tube overlies the mid stomach. Proximal port of the NG tube is just distal to the EG junction. Nonspecific bowel gas pattern in the visualized upper abdomen. Telemetry leads overlie the chest. IMPRESSION: NG tube tip is in the mid stomach. Electronically Signed   By: Kennith Center M.D.   On: 05/12/2018 17:45   Dg Chest Port 1 View  Result Date: 06/03/2018 CLINICAL DATA:  Respiratory distress. EXAM: PORTABLE CHEST 1 VIEW 2:12 p.m. COMPARISON:  06/03/2018 at 6:14 a.m. and 06/02/2018 FINDINGS: The heart size and pulmonary vascularity are within normal limits. Tortuosity and calcification of the thoracic aorta. Hazy bilateral pulmonary infiltrates primarily in the lower  lobes with increased consolidation at the left lung base since the prior study. No definable effusions. Faint Kerley B-lines at the lung bases. IMPRESSION: Findings are consistent with bilateral pulmonary edema, slightly  progressed at the left lung base. Aortic Atherosclerosis (ICD10-I70.0). Electronically Signed   By: Francene Boyers M.D.   On: 06/03/2018 14:36   Dg Chest Port 1 View  Result Date: 06/03/2018 CLINICAL DATA:  Short of breath EXAM: PORTABLE CHEST 1 VIEW COMPARISON:  06/02/2018 FINDINGS: Diffuse interstitial infiltrates and vascular congestion is are worse. More confluent opacity towards the lung bases likely due to airspace disease has developed. No pneumothorax or pleural effusion. Normal heart size. IMPRESSION: The above findings most likely represent worsening diffuse pulmonary edema with a normal heart size. An inflammatory process is not excluded. Electronically Signed   By: Jolaine Click M.D.   On: 06/03/2018 08:01   Dg Chest Port 1 View  Result Date: 06/02/2018 CLINICAL DATA:  Chest pain. EXAM: PORTABLE CHEST 1 VIEW COMPARISON:  None. FINDINGS: The heart size and mediastinal contours are within normal limits. No pneumothorax or pleural effusion is noted. Mild bilateral interstitial densities are noted, including Kerley B lines in both lung bases, concerning for possible pulmonary edema. The visualized skeletal structures are unremarkable. IMPRESSION: Findings concerning for mild bilateral pulmonary edema. Aortic Atherosclerosis (ICD10-I70.0). Electronically Signed   By: Lupita Raider, M.D.   On: 06/02/2018 16:06   Dg Abd Portable 1v  Result Date: 05/11/2018 CLINICAL DATA:  Initial evaluation for NG tube placement EXAM: PORTABLE ABDOMEN - 1 VIEW COMPARISON:  Prior radiograph from 05/01/2018 FINDINGS: Enteric tube in place with tip overlying the stomach, side hole well beyond the GE junction. Tube is coiled within the gastric body. Visualized bowel gas pattern is nonobstructive. Mild subsegmental atelectatic changes at the right lung base. IMPRESSION: Enteric tube coiled within the stomach, side hole well beyond the GE junction. Electronically Signed   By: Rise Mu M.D.   On: 05/11/2018  04:39    Microbiology: Recent Results (from the past 240 hour(s))  Culture, Urine     Status: Abnormal   Collection Time: 06/03/18  2:16 PM  Result Value Ref Range Status   Specimen Description URINE, RANDOM  Final   Special Requests   Final    NONE Performed at [facility], [address] [place], [place] [address]    Culture >=100,000 COLONIES/mL KLEBSIELLA PNEUMONIAE (A)  Final   Report Status 06/06/2018 FINAL  Final   Organism ID, Bacteria KLEBSIELLA PNEUMONIAE (A)  Final      Susceptibility   Klebsiella pneumoniae - MIC*    AMPICILLIN >=32 RESISTANT Resistant     CEFAZOLIN <=4 SENSITIVE Sensitive     CEFTRIAXONE <=1 SENSITIVE Sensitive     CIPROFLOXACIN <=0.25 SENSITIVE Sensitive     GENTAMICIN <=1 SENSITIVE Sensitive     IMIPENEM <=0.25 SENSITIVE Sensitive     NITROFURANTOIN 128 RESISTANT Resistant     TRIMETH/SULFA <=20 SENSITIVE Sensitive     AMPICILLIN/SULBACTAM 8 SENSITIVE Sensitive     PIP/TAZO 8 SENSITIVE Sensitive     Extended ESBL NEGATIVE Sensitive     * >=100,000 COLONIES/mL KLEBSIELLA PNEUMONIAE  Culture, blood (routine x 2)     Status: None (Preliminary result)   Collection Time: 06/03/18  2:51 PM  Result Value Ref Range Status   Specimen Description BLOOD LEFT HAND  Final   Special Requests   Final    BOTTLES DRAWN AEROBIC ONLY Blood Culture adequate volume  Culture   Final    NO GROWTH 2 DAYS Performed at [facility], [address] [place], [place] [address]    Report Status PENDING  Incomplete  Culture, blood (routine x 2)     Status: None (Preliminary result)   Collection Time: 06/03/18  2:54 PM  Result Value Ref Range Status   Specimen Description BLOOD RIGHT HAND  Final   Special Requests   Final    BOTTLES DRAWN AEROBIC ONLY Blood Culture adequate volume   Culture   Final    NO GROWTH 2 DAYS Performed at [facility], [address] [place], [place] [address]    Report Status PENDING  Incomplete     Labs: Basic  Metabolic Panel: Recent Labs  Lab 06/02/18 1536 06/02/18 1912 06/03/18 0845 06/04/18 0015 06/05/18 0409 06/06/18 0343  NA 144  --  144 139 137 137  K 4.1  --  3.8 4.5 4.4 4.8  CL 114*  --  113* 106 103 102  CO2 17*  --  20* 20* 20* 24  GLUCOSE 211*  --  97 241* 191* 191*  BUN 27*  --  26* 26* 52* 65*  CREATININE 1.30*  --  1.20 1.36* 2.04* 2.08*  CALCIUM 8.7*  --  9.0 9.1 9.2 9.0  MG  --  1.1* 1.8 1.6* 2.4 2.2   Liver Function Tests: No results for input(s): AST, ALT, ALKPHOS, BILITOT, PROT, ALBUMIN in the last 168 hours. No results for input(s): LIPASE, AMYLASE in the last 168 hours. No results for input(s): AMMONIA in the last 168 hours. CBC: Recent Labs  Lab 06/01/18 1426 06/02/18 1536 06/03/18 0845  WBC 9.1 16.7* 12.5*  HGB 8.1* 9.2* 9.1*  HCT 27.5* 32.4* 31.8*  MCV 90.2 91.0 90.1  PLT 156 245 197   Cardiac Enzymes: Recent Labs  Lab 06/01/18 1426  06/03/18 0012 06/03/18 0845 06/03/18 1209 06/03/18 1837 06/03/18 2331  CKTOTAL 20*  --   --   --   --   --   --   CKMB 1.3  --   --   --   --   --   --   TROPONINI 0.03*   < > 0.98* 1.24* 1.04* 0.72* 0.52*   < > = values in this interval not displayed.   BNP: BNP (last 3 results) Recent Labs    04/28/18 1052  BNP 1,239.6*    ProBNP (last 3 results) No results for input(s): PROBNP in the last 8760 hours.  CBG: Recent Labs  Lab 06/05/18 0636 06/05/18 1146 06/05/18 1658 06/05/18 2132 06/06/18 0649  GLUCAP 173* 159* 213* 161* 179*       Signed:  Albertine GratesFang Adilyn Humes MD, PhD  [facility] 06/06/2018, 9:48 AM

## 2018-06-06 NOTE — Progress Notes (Signed)
Inpatient Rehabilitation Admissions Coordinator  I spoke with Dr. Erlinda Hong and patient is medically ready to readmit to CIR today. I met with patient and his RN at bedside and he is in agreement to readmission. RN CM, Drue Dun, is aware. I will make the arrangements to admit today to CIR.  Danne Baxter, RN, MSN Rehab Admissions Coordinator [phone] 06/06/2018 10:23 AM

## 2018-06-06 NOTE — TOC Transition Note (Signed)
Transition of Care [facility]) - CM/SW Discharge Note Donn Pierini RN, BSN Transitions of Care Unit 4E- RN Case Manager [phone]  Patient Details  Name: NYAN WHAN MRN: [id number] Date of Birth: Jul 08, 1945  Transition of Care [facility]) CM/SW Contact:  Darrold Span, RN Phone Number: [phone] 06/06/2018, 1:08 PM   Clinical Narrative:    Pt tx from [facility] IP rehab with afib, pt now stable to transition back to [facility] IP rehab to continue rehab stay prior to returning home with family.    Final next level of care: IP Rehab Facility Barriers to Discharge: No Barriers Identified   Patient Goals and CMS Choice Patient states their goals for this hospitalization and ongoing recovery are:: "to get back to rehab" CMS Medicare.gov Compare Post Acute Care list provided to:: Patient    Discharge Placement  [facility] IP rehab                     Discharge Plan and Services     Post Acute Care Choice: IP Rehab                    Social Determinants of Health (SDOH) Interventions     Readmission Risk Interventions Readmission Risk Prevention Plan 06/06/2018  Transportation Screening Complete  PCP or Specialist Appt within 3-5 Days Complete  HRI or Home Care Consult Complete  Social Work Consult for Recovery Care Planning/Counseling Complete  Palliative Care Screening Not Applicable  Medication Review [employer]) Complete

## 2018-06-07 ENCOUNTER — Inpatient Hospital Stay (HOSPITAL_COMMUNITY): Payer: Medicare Other | Admitting: Occupational Therapy

## 2018-06-07 ENCOUNTER — Inpatient Hospital Stay (HOSPITAL_COMMUNITY): Payer: Medicare Other | Admitting: Physical Therapy

## 2018-06-07 DIAGNOSIS — R739 Hyperglycemia, unspecified: Secondary | ICD-10-CM

## 2018-06-07 DIAGNOSIS — G894 Chronic pain syndrome: Secondary | ICD-10-CM

## 2018-06-07 DIAGNOSIS — D649 Anemia, unspecified: Secondary | ICD-10-CM

## 2018-06-07 DIAGNOSIS — F411 Generalized anxiety disorder: Secondary | ICD-10-CM

## 2018-06-07 DIAGNOSIS — I5041 Acute combined systolic (congestive) and diastolic (congestive) heart failure: Secondary | ICD-10-CM

## 2018-06-07 DIAGNOSIS — R740 Nonspecific elevation of levels of transaminase and lactic acid dehydrogenase [LDH]: Secondary | ICD-10-CM

## 2018-06-07 DIAGNOSIS — D72829 Elevated white blood cell count, unspecified: Secondary | ICD-10-CM

## 2018-06-07 DIAGNOSIS — T380X5A Adverse effect of glucocorticoids and synthetic analogues, initial encounter: Secondary | ICD-10-CM

## 2018-06-07 DIAGNOSIS — E871 Hypo-osmolality and hyponatremia: Secondary | ICD-10-CM

## 2018-06-07 DIAGNOSIS — E875 Hyperkalemia: Secondary | ICD-10-CM

## 2018-06-07 DIAGNOSIS — N179 Acute kidney failure, unspecified: Secondary | ICD-10-CM

## 2018-06-07 DIAGNOSIS — E119 Type 2 diabetes mellitus without complications: Secondary | ICD-10-CM

## 2018-06-07 DIAGNOSIS — I10 Essential (primary) hypertension: Secondary | ICD-10-CM

## 2018-06-07 DIAGNOSIS — K50919 Crohn's disease, unspecified, with unspecified complications: Secondary | ICD-10-CM

## 2018-06-07 DIAGNOSIS — I25110 Atherosclerotic heart disease of native coronary artery with unstable angina pectoris: Secondary | ICD-10-CM

## 2018-06-07 DIAGNOSIS — R7401 Elevation of levels of liver transaminase levels: Secondary | ICD-10-CM

## 2018-06-07 LAB — CBC WITH DIFFERENTIAL/PLATELET
Abs Immature Granulocytes: 0.09 10*3/uL — ABNORMAL HIGH (ref 0.00–0.07)
Basophils Absolute: 0 10*3/uL (ref 0.0–0.1)
Basophils Relative: 0 %
Eosinophils Absolute: 0 10*3/uL (ref 0.0–0.5)
Eosinophils Relative: 0 %
HCT: 27.4 % — ABNORMAL LOW (ref 39.0–52.0)
Hemoglobin: 8.4 g/dL — ABNORMAL LOW (ref 13.0–17.0)
Immature Granulocytes: 1 %
Lymphocytes Relative: 3 %
Lymphs Abs: 0.4 10*3/uL — ABNORMAL LOW (ref 0.7–4.0)
MCH: 26.6 pg (ref 26.0–34.0)
MCHC: 30.7 g/dL (ref 30.0–36.0)
MCV: 86.7 fL (ref 80.0–100.0)
Monocytes Absolute: 0.4 10*3/uL (ref 0.1–1.0)
Monocytes Relative: 3 %
Neutro Abs: 11.5 10*3/uL — ABNORMAL HIGH (ref 1.7–7.7)
Neutrophils Relative %: 93 %
Platelets: 231 10*3/uL (ref 150–400)
RBC: 3.16 MIL/uL — ABNORMAL LOW (ref 4.22–5.81)
RDW: 17.1 % — ABNORMAL HIGH (ref 11.5–15.5)
WBC: 12.4 10*3/uL — ABNORMAL HIGH (ref 4.0–10.5)
nRBC: 0 % (ref 0.0–0.2)

## 2018-06-07 LAB — COMPREHENSIVE METABOLIC PANEL
ALT: 49 U/L — ABNORMAL HIGH (ref 0–44)
AST: 35 U/L (ref 15–41)
Albumin: 3.5 g/dL (ref 3.5–5.0)
Alkaline Phosphatase: 51 U/L (ref 38–126)
Anion gap: 14 (ref 5–15)
BUN: 80 mg/dL — ABNORMAL HIGH (ref 8–23)
CO2: 17 mmol/L — ABNORMAL LOW (ref 22–32)
Calcium: 9 mg/dL (ref 8.9–10.3)
Chloride: 100 mmol/L (ref 98–111)
Creatinine, Ser: 2.47 mg/dL — ABNORMAL HIGH (ref 0.61–1.24)
GFR calc Af Amer: 29 mL/min — ABNORMAL LOW (ref >60)
GFR calc non Af Amer: 25 mL/min — ABNORMAL LOW (ref >60)
Glucose, Bld: 243 mg/dL — ABNORMAL HIGH (ref 70–99)
Potassium: 5.7 mmol/L — ABNORMAL HIGH (ref 3.5–5.1)
Sodium: 131 mmol/L — ABNORMAL LOW (ref 135–145)
Total Bilirubin: 1.2 mg/dL (ref 0.3–1.2)
Total Protein: 5.9 g/dL — ABNORMAL LOW (ref 6.5–8.1)

## 2018-06-07 LAB — GLUCOSE, CAPILLARY
Glucose-Capillary: 232 mg/dL — ABNORMAL HIGH (ref 70–99)
Glucose-Capillary: 142 mg/dL — ABNORMAL HIGH (ref 70–99)
Glucose-Capillary: 189 mg/dL — ABNORMAL HIGH (ref 70–99)
Glucose-Capillary: 181 mg/dL — ABNORMAL HIGH (ref 70–99)

## 2018-06-07 LAB — TROPONIN I
Troponin I: 0.33 ng/mL (ref <0.03)
Troponin I: 0.31 ng/mL (ref <0.03)
Troponin I: 0.29 ng/mL (ref <0.03)

## 2018-06-07 LAB — CK TOTAL AND CKMB (NOT AT ARMC)
CK, MB: 1.7 ng/mL (ref 0.5–5.0)
Relative Index: INVALID (ref 0.0–2.5)
Total CK: 33 U/L — ABNORMAL LOW (ref 49–397)

## 2018-06-07 MED ORDER — IPRATROPIUM BROMIDE 0.02 % IN SOLN
0.5000 mg | Freq: Three times a day (TID) | RESPIRATORY_TRACT | Status: DC
  Administered 2018-06-07 – 2018-06-09 (×8): 0.5 mg via RESPIRATORY_TRACT
  Filled 2018-06-07 (×8): qty 2.5

## 2018-06-07 MED ORDER — ALUM & MAG HYDROXIDE-SIMETH 200-200-20 MG/5ML PO SUSP
30.0000 mL | Freq: Once | ORAL | Status: DC
  Filled 2018-06-07: qty 30

## 2018-06-07 MED ORDER — ALUM & MAG HYDROXIDE-SIMETH 200-200-20 MG/5 ML NICU TOPICAL
1.0000 "application " | Freq: Once | TOPICAL | Status: DC
  Filled 2018-06-07: qty 355

## 2018-06-07 MED ORDER — ALLOPURINOL 100 MG PO TABS
100.0000 mg | ORAL_TABLET | Freq: Every day | ORAL | Status: DC
  Administered 2018-06-07 – 2018-06-17 (×11): 100 mg via ORAL
  Filled 2018-06-07 (×11): qty 1

## 2018-06-07 MED ORDER — CLONAZEPAM 0.25 MG PO TBDP
0.2500 mg | ORAL_TABLET | Freq: Three times a day (TID) | ORAL | Status: DC | PRN
  Administered 2018-06-07 – 2018-06-10 (×6): 0.25 mg via ORAL
  Filled 2018-06-07 (×6): qty 1

## 2018-06-07 MED ORDER — LEVALBUTEROL HCL 1.25 MG/0.5ML IN NEBU
1.2500 mg | INHALATION_SOLUTION | Freq: Three times a day (TID) | RESPIRATORY_TRACT | Status: DC
  Administered 2018-06-07 – 2018-06-09 (×6): 1.25 mg via RESPIRATORY_TRACT
  Filled 2018-06-07 (×12): qty 0.5

## 2018-06-07 MED ORDER — CLONAZEPAM 0.5 MG PO TABS: 0.2500 mg | ORAL_TABLET | Freq: Three times a day (TID) | ORAL | Status: DC | PRN

## 2018-06-07 MED ORDER — CLONAZEPAM 0.25 MG PO TBDP
0.2500 mg | ORAL_TABLET | Freq: Three times a day (TID) | ORAL | Status: DC | PRN
  Administered 2018-06-07 (×2): 0.25 mg via ORAL
  Filled 2018-06-07 (×2): qty 1

## 2018-06-07 MED ORDER — ALUM & MAG HYDROXIDE-SIMETH 200-200-20 MG/5 ML NICU TOPICAL: 1.0000 "application " | Freq: Once | TOPICAL | Status: DC

## 2018-06-07 NOTE — Progress Notes (Addendum)
Pt c/o of chest pain and SOB after attempting BM in the BR. Bradycardic briefly with apical rate of 56 but quickly recovered to upper 80's.  Pt was very anxious and kept saying that the doctor changed two of his meds. House supervisor Theodoro Grist in with pt. EKG performed, VSS, pt given  NTG  X 2 and reports he "may feel better but my stomach hurts". Orders obtained from [facility]. Pt  Remains anxious will monitor.

## 2018-06-07 NOTE — Consult Note (Signed)
Ref: System, Pcp Not In   Subjective:  Shortness of breath continues at rest. Afebrile. Heart rate is improving.   Objective:  Vital Signs in the last 24 hours: Temp:  [98.1 F (36.7 C)-99.2 F (37.3 C)] 98.3 F (36.8 C) (04/16 2043) Pulse Rate:  [82-133] 104 (04/17 0751) Resp:  [14-24] 17 (04/17 0406) BP: (108-131)/(58-89) 108/80 (04/17 0751) SpO2:  [90 %-100 %] 94 % (04/17 0751)  Physical Exam: BP Readings from Last 1 Encounters:  06/07/18 108/80     Wt Readings from Last 1 Encounters:  06/06/18 77.3 kg    Weight change:  There is no height or weight on file to calculate BMI. HEENT: [place]/AT, Eyes-Brown, PERL, EOMI, Conjunctiva-Pale, Sclera-Non-icteric Neck: No JVD, No bruit, Trachea midline. Lungs:  Clearing, Bilateral. Cardiac:  Irregular rhythm, normal S1 and S2, no S3. II/VI systolic murmur. Abdomen:  Soft, non-tender. BS present. Extremities:  No edema present. No cyanosis. No clubbing. CNS: AxOx3, Cranial nerves grossly intact, moves all 4 extremities.  Skin: Warm and dry.   Intake/Output from previous day: 04/16 0701 - 04/17 0700 In: -  Out: 1250 [Urine:1250]    Lab Results: BMET    Component Value Date/Time   NA 131 (L) 06/07/2018 0637   NA 137 06/06/2018 0343   NA 137 06/05/2018 0409   K 5.7 (H) 06/07/2018 0637   K 4.8 06/06/2018 0343   K 4.4 06/05/2018 0409   CL 100 06/07/2018 0637   CL 102 06/06/2018 0343   CL 103 06/05/2018 0409   CO2 17 (L) 06/07/2018 0637   CO2 24 06/06/2018 0343   CO2 20 (L) 06/05/2018 0409   GLUCOSE 243 (H) 06/07/2018 0637   GLUCOSE 191 (H) 06/06/2018 0343   GLUCOSE 191 (H) 06/05/2018 0409   BUN 80 (H) 06/07/2018 0637   BUN 65 (H) 06/06/2018 0343   BUN 52 (H) 06/05/2018 0409   CREATININE 2.47 (H) 06/07/2018 0637   CREATININE 2.08 (H) 06/06/2018 0343   CREATININE 2.04 (H) 06/05/2018 0409   CALCIUM 9.0 06/07/2018 0637   CALCIUM 9.0 06/06/2018 0343   CALCIUM 9.2 06/05/2018 0409   GFRNONAA 25 (L) 06/07/2018 0637   GFRNONAA 31 (L) 06/06/2018 0343   GFRNONAA 32 (L) 06/05/2018 0409   GFRAA 29 (L) 06/07/2018 0637   GFRAA 36 (L) 06/06/2018 0343   GFRAA 37 (L) 06/05/2018 0409   CBC    Component Value Date/Time   WBC 12.4 (H) 06/07/2018 0637   RBC 3.16 (L) 06/07/2018 0637   HGB 8.4 (L) 06/07/2018 0637   HCT 27.4 (L) 06/07/2018 0637   PLT 231 06/07/2018 0637   MCV 86.7 06/07/2018 0637   MCH 26.6 06/07/2018 0637   MCHC 30.7 06/07/2018 0637   RDW 17.1 (H) 06/07/2018 0637   LYMPHSABS 0.4 (L) 06/07/2018 0637   MONOABS 0.4 06/07/2018 0637   EOSABS 0.0 06/07/2018 0637   BASOSABS 0.0 06/07/2018 0637   HEPATIC Function Panel Recent Labs    04/27/18 0124 05/20/18 0554 06/07/18 0637  PROT 5.3* 5.9* 5.9*   HEMOGLOBIN A1C No components found for: HGA1C,  MPG CARDIAC ENZYMES Lab Results  Component Value Date   CKTOTAL 33 (L) 06/07/2018   CKMB 1.7 06/07/2018   TROPONINI 0.33 (HH) 06/07/2018   TROPONINI 0.52 (HH) 06/03/2018   TROPONINI 0.72 (HH) 06/03/2018   BNP No results for input(s): PROBNP in the last 8760 hours. TSH Recent Labs    04/28/18 1052 06/03/18 0012  TSH 0.109* 1.411   CHOLESTEROL No results for  input(s): CHOL in the last 8760 hours.  Scheduled Meds: . allopurinol  100 mg Oral Daily  . alum & mag hydroxide-simeth  30 mL Oral Once  . alum & mag hydroxide-simeth  1 application Topical Once  . amiodarone  200 mg Oral Daily  . aspirin EC  81 mg Oral Daily  . atorvastatin  40 mg Oral q1800  . bethanechol  10 mg Oral TID  . diltiazem  300 mg Oral Daily  . folic acid  1 mg Oral Daily  . furosemide  40 mg Oral Daily  . heparin injection (subcutaneous)  5,000 Units Subcutaneous Q8H  . insulin aspart  0-15 Units Subcutaneous TID WC  . insulin glargine  7 Units Subcutaneous QHS  . ipratropium  0.5 mg Nebulization Q8H  . levalbuterol  1.25 mg Nebulization Q8H  . loratadine  10 mg Oral Daily  . metoprolol tartrate  50 mg Oral BID  . pantoprazole  40 mg Oral BID  . predniSONE   60 mg Oral Q breakfast  . sulfamethoxazole-trimethoprim  1 tablet Oral Q12H  . tamsulosin  0.4 mg Oral QPC supper  . 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   LOS: 1 day    Richard Cobb  MD  06/07/2018, 10:50 AM

## 2018-06-07 NOTE — Progress Notes (Signed)
[facility] PHYSICAL MEDICINE & REHABILITATION PROGRESS NOTE  Subjective/Complaints: Patient seen sitting up in bed this morning.  He states he did not sleep well overnight due to chest pain and shortness of breath, discussed with PA as well as night nurse.  Notes reviewed.  Rapid response was called due to chest pain EKG was ordered.  Patient was placed on supplemental oxygen and nitro was also administered.  This morning patient remains upset and anxious, similar to when he initially presented to CIR.  ROS: + Chest pain, shortness of breath, nausea.  Denies diarrhea  Objective: Vital Signs: Blood pressure 108/80, pulse (!) 104, temperature 98.3 F (36.8 C), resp. rate 17, SpO2 94 %. No results found. Recent Labs    06/07/18 0637  WBC 12.4*  HGB 8.4*  HCT 27.4*  PLT 231   Recent Labs    06/06/18 0343 06/07/18 0637  NA 137 131*  K 4.8 5.7*  CL 102 100  CO2 24 17*  GLUCOSE 191* 243*  BUN 65* 80*  CREATININE 2.08* 2.47*  CALCIUM 9.0 9.0    Physical Exam: BP 108/80 (BP Location: Left Arm)   Pulse (!) 104   Temp 98.3 F (36.8 C)   Resp 17   SpO2 94%  Constitutional: No distress . Vital signs reviewed. HENT: Normocephalic.  Atraumatic. Eyes: EOMI. No discharge. Cardiovascular: Irregularly irregular. No JVD. Respiratory: CTA Bilaterally. Normal effort. GI: Non-distended. Musc: No edema or tenderness in extremities. Neurological: Patient is alert  Follows commands.  Skin: Color is chronically grayand face Motor: Bilateral upper extremities: 4+/5 proximal distal Right lower extremity: Hip flexion, knee extension 2+/5, ankle dorsiflexion 4 medicine/5 Left lower extremity: Hip flexion, knee extension 2+/5, ankle dorsiflexion 1/5   Assessment/Plan: 1. Functional deficits secondary to chronic respiratory failure with multiple medical complications, including cardiac and rheumatological which require 3+ hours per day of interdisciplinary therapy in a comprehensive  inpatient rehab setting.  Physiatrist is providing close team supervision and 24 hour management of active medical problems listed below.  Physiatrist and rehab team continue to assess barriers to discharge/monitor patient progress toward functional and medical goals  Care Tool:  Bathing              Bathing assist       Upper Body Dressing/Undressing Upper body dressing        Upper body assist      Lower Body Dressing/Undressing Lower body dressing            Lower body assist       Toileting Toileting    Toileting assist Assist for toileting: Moderate Assistance - Patient 50 - 74%     Transfers Chair/bed transfer  Transfers assist           Locomotion Ambulation   Ambulation assist              Walk 10 feet activity   Assist           Walk 50 feet activity   Assist           Walk 150 feet activity   Assist           Walk 10 feet on uneven surface  activity   Assist           Wheelchair     Assist               Wheelchair 50 feet with 2 turns activity    Assist  Wheelchair 150 feet activity     Assist            Medical Problem List and Plan: 1.Debilitysecondary to acute on chronic respiratory failurewith hypoxemia and hypercarbia, CAD/CHF with history of colitis on Remicade.  Begin CIR 2. Antithrombotics: -DVT/anticoagulation:Subcutaneous heparin -antiplatelet therapy: Aspirin 81 mg daily 3. Pain Management/Chronic back pain:Hydrocodone and Robaxin as needed  Will need significant encouragement again 4. Mood:Provide emotional support  Klonopin 3 times daily started on 4/17 -antipsychotic agents: N/A 5. Neuropsych: This patientiscapable of making decisions on hisown behalf. 6. Skin/Wound Care:Routine skin checks 7. Fluids/Electrolytes/Nutrition:Routine in and outs 8.CAD with non-STEMI. Continue aspirin. Follow-up per  cardiology services  Anxiety likely large contributing factor to chest pain, however objective findings present as well  ECG overnight reviewed showing atrial flutter with?  Inferolateral ischemia, troponins elevated, but decreased since 4/13, will discuss with cards 9.Atrial fibrillation. Diltiazem daily/amiodarone daily/hydralazine/metoprolol.   Medications being adjusted by Cards, appreciate recs  Heart rate remains elevated on 4/17 10.Hypertension.  See #9  Monitor with increased mobility 11. Diastolic congestive heart failure. Monitor for any signs of fluid overload.Continue Lasix as directed  Daily weights ordered There were no vitals filed for this visit. 12. Urinary retention/Klebsiella pneumo UTI. Urecholine 10 mg 3 times a day, Flomax 0.4 mg daily. complete course of Bactrim through 4/21 13. Hyperlipidemia. Lipitor 14.  Steroid-induced hyperglycemia with hx of DM. Latest hemoglobin A1c 5.0.   Patient on metformin 1000 mg twice a day prior to admissionbut held due to mildly elevated creatinine.   Presently on Lantus insulin 7 units daily at bedtime.   Check blood sugars before meals and at bedtime. Diabetic teaching  Monitor with taper of prednisone.   Monitor with increased mobility 15. Crohn's colitis with history of GI bleed. Protonix 40 mg twice a day.Patientdid receive Remicade 06/05/2018 and is scheduled every 8 weeks  Need to be particularly cautious due to immunosuppression 16. AKI/renal insufficiency. Strict in and out's.   Creatinine 2.47 on 4/17, repeat labs ordered  Encourage fluids 16. Tobacco abuse. Counseling 17. Acute on chronic anemia.   Hemoglobin 8.4 on 4/17  Labs ordered for Monday 18. Hyponatremia  Sodium 131 on 4/17  Repeat labs ordered  Labs ordered for Monday 19.  Hyperkalemia  Potassium 5.7 on 4/17  Repeat labs ordered  Labs ordered for Monday 20.  Transaminitis  ALT elevated on 4/17  Labs ordered for Monday 21.  Leukocytosis  WBCs  12.4 on 4/17  Afebrile  Continue to monitor  LOS: 1 days A FACE TO FACE EVALUATION WAS PERFORMED  Ankit Karis Juba 06/07/2018, 9:14 AM

## 2018-06-07 NOTE — Significant Event (Addendum)
Rapid response called at approximately 0340. Pt c/o chest pain and SOB after attempting to have a bowel movement. Rated pain 8/10 stated pain is located  In chest, stomach, arms. However, pt is inconsistent with location of pain. Pt was given 2 Nitro with pain relief. Pt placed on 2L/[place], VS  stable, EKG showed a.fib.  Pt very anxious about recent change in BP medications. Overall no further complaints will continue to monitor pt.

## 2018-06-07 NOTE — IPOC Note (Signed)
Overall Plan of Care (IPOC) Patient Details Name: Richard SandiferMichael A Yeats MRN: [id number] DOB: 05/22/1945  Admitting Diagnosis: Debility  Hospital Problems: Active Problems:   Debility   Transaminitis   Hyperkalemia   Hyponatremia   Acute on chronic anemia   Diabetes mellitus type 2 in nonobese (HCC)   Acute combined systolic and diastolic congestive heart failure (HCC)   Coronary artery disease involving native coronary artery of native heart with unstable angina pectoris (HCC)   Chronic pain syndrome   Anxiety state   Functional Problem List: Nursing Endurance, Medication Management, Sensory, Safety, Perception, Pain, Motor, Skin Integrity  PT Endurance, Motor, Safety, Sensory  OT Balance, Endurance, Motor, Pain, Perception, Safety  SLP    TR         Basic ADL's: OT Eating, Grooming, Bathing, Dressing, Toileting     Advanced  ADL's: OT       Transfers: PT Bed Mobility, Floor, Bed to Chair, Car, Occupational psychologisturniture  OT Toilet, Research scientist (life sciences)Tub/Shower     Locomotion: PT Psychologist, prison and probation servicesWheelchair Mobility, Ambulation, Stairs     Additional Impairments: OT None  SLP        TR      Anticipated Outcomes Item Anticipated Outcome  Self Feeding mod I  Swallowing      Basic self-care  Marketing executiveupervision  Toileting  Supervision   Bathroom Transfers Supervision  Bowel/Bladder  mod I  Transfers  supervision  Locomotion  supervision short distance gait w/ LRAD  Communication     Cognition     Pain  less than 4  Safety/Judgment  mod I   Therapy Plan: PT Intensity: Minimum of 1-2 x/day ,45 to 90 minutes PT Frequency: 5 out of 7 days PT Duration Estimated Length of Stay: 10-14 days OT Intensity: Minimum of 1-2 x/day, 45 to 90 minutes OT Frequency: 5 out of 7 days OT Duration/Estimated Length of Stay: 12-14 days     Due to the current state of emergency, patients may not be receiving their 3-hours of Medicare-mandated therapy.   Team Interventions: Nursing Interventions Pain Management, Psychosocial  Support, Disease Management/Prevention, Medication Management, Cognitive Remediation/Compensation, Discharge Planning  PT interventions Ambulation/gait training, Community reintegration, DME/adaptive equipment instruction, Neuromuscular re-education, Psychosocial support, Stair training, UE/LE Strength taining/ROM, Wheelchair propulsion/positioning, Warden/rangerBalance/vestibular training, Discharge planning, Pain management, Functional electrical stimulation, Skin care/wound management, Therapeutic Activities, UE/LE Coordination activities, Therapeutic Exercise, Splinting/orthotics, Patient/family education, Functional mobility training, Disease management/prevention  OT Interventions Balance/vestibular training, Disease mangement/prevention, Neuromuscular re-education, Self Care/advanced ADL retraining, Therapeutic Exercise, Wheelchair propulsion/positioning, Cognitive remediation/compensation, DME/adaptive equipment instruction, Pain management, UE/LE Strength taining/ROM, Community reintegration, Development worker, international aidunctional electrical stimulation, Patient/family education, UE/LE Coordination activities, Discharge planning, Functional mobility training, Psychosocial support, Therapeutic Activities  SLP Interventions    TR Interventions    SW/CM Interventions Discharge Planning, Psychosocial Support, Patient/Family Education   Barriers to Discharge MD  Medical stability and Behavior  Nursing Other (comments)    PT Medical stability    OT Other (comments)    SLP      SW       Team Discharge Planning: Destination: PT-Home ,OT- Home , SLP-  Projected Follow-up: PT-Home health PT, OT-  Home health OT, SLP-  Projected Equipment Needs: PT-To be determined, OT- To be determined, SLP-  Equipment Details: PT- , OT-  Patient/family involved in discharge planning: PT- Patient,  OT-Patient, SLP-   MD ELOS: 10-14 days. Medical Rehab Prognosis:  Excellent Assessment: 73 year old right-handed male with history of COPD with  remote tobacco abuse not on home oxygen at baseline, Crohn's  colitis/GI bleed, diabetes mellitus, chronic low back pain with foot drop, CAD with non-STEMI maintained on aspirin, renal insufficiency, atrial fibrillation and hypertension. Patient initially presented to [facility] with increasing shortness of breath and lower extremity edema. Chest x-ray and echocardiogram showed moderate LV systolic dysfunction and moderate to severe MR and moderate pulmonary systolic hypertension consistent with CHF. Patient was started on BiPAP however deteriorated and ended up having to be intubated placed on the ventilator. Patient subsequently complications associated with worsening renal function, congestive heart failure. Attempts at extubation failed and he was transferred to [facility] 04/26/2018 for ventilation wean. Initially on IV Solu-Medrol transition to POprednisone after ventilation discontinued. Tracheostomy tube was not needed. Patient later extubated successfully and monitored. Continued on prednisone with slow taper. Remained on aspirin therapy as well as Cardizem for history of atrial fibrillation. Therapy evaluations completed and patient was admitted for a comprehensive rehab program 05/17/2018. Patient with steady progressive gains. Noted on 06/02/2018 around 2 PM developed rapid heart rate nonspecific chest pain increasing shortness of breath and hypoxia. He did received nitro x2 and morphine 2 mg x 2 with minimal relief. He was saturating well on 8 L of oxygen. Heart rate noted to be in the 160s. Troponin elevated 0.14-1.24. EKG showed rapid rate possibly some worsening heart block. Chest x-ray concerning for mild bilateral pulmonary edema. He was discharged to acute care services for ongoing evaluation 06/02/2018.Cardiology service follow-up Dr. Algie Coffer. Echocardiogram with ejection fraction of 35 to 40% moderately reduced systolic function. His amiodarone was  resumed for heart rate control and myocardial ischemia. Intravenous Cardizem was initiated and continued on telemetry with transition to PO diltiazem.Patient has been using nonrebreather mask at 4 L for comfort can use nasal cannula but prefers facemask if possible.He remains on subcutaneous heparin for DVT prophylaxis. His diet has been advanced to a regular consistency. AKI felt to be related to diuresis creatinine 2.08. Metformin was held due to elevations in creatinine. Noted history of Crohn's colitis and did receive Remicade after contacts made to Dr.Wimmer [phone] did have some bouts of urinary retention maintained on low-dose Urecholine. Klebsiella pneumo UTI completing course of Bactrim.Patient with resulting functional deficits with mobility, endurance, self-care.  Will set goals for Supervision/Mod I with PT/OT.   See Team Conference Notes for weekly updates to the plan of care

## 2018-06-07 NOTE — H&P (Signed)
Physical Medicine and Rehabilitation Admission H&P °  °  °Chief complaint: Weakness °  °HPI: Richard Wang 73-year-old right-handed male with history of COPD with remote tobacco abuse not on home oxygen at baseline, Crohn's colitis/GI bleed, diabetes mellitus, chronic low back pain with foot drop, CAD with non-STEMI maintained on aspirin, renal insufficiency, atrial fibrillation and hypertension.  Patient initially presented to [facility] with increasing shortness of breath and lower extremity edema.  Chest x-ray and echocardiogram showed moderate LV systolic dysfunction and moderate to severe MR and moderate pulmonary systolic hypertension consistent with CHF.  Patient was started on BiPAP however deteriorated and ended up having to be intubated placed on the ventilator.  Patient subsequently complications associated with worsening renal function, congestive heart failure.  Attempts at extubation failed and he was transferred to [facility] 04/26/2018 for ventilation wean.  Initially on IV Solu-Medrol transition to PO prednisone after ventilation discontinued.  Tracheostomy tube was not needed.  Patient later extubated successfully and monitored.  Continued on prednisone with slow taper.  Subcutaneous heparin for DVT prophylaxis.  Remained on aspirin therapy as well as Cardizem for history of atrial fibrillation.   Therapy evaluations completed and patient was admitted for a comprehensive rehab program 05/17/2018.  Patient with steady progressive gains.  Noted on 06/02/2018 around 2 PM developed rapid heart rate nonspecific chest pain increasing shortness of breath and hypoxia.  He did receive nitro x2 and morphine 2 mg x 2 with minimal relief.  He was saturating well on 8 L of oxygen.  Heart rate noted to be in the 160s.  Troponin elevated 0.14-1.24.  EKG showed rapid rate possibly some worsening heart block.  Chest x-ray concerning for mild bilateral pulmonary edema.  He was discharged to  acute care services for ongoing evaluation 06/02/2018.  Cardiology service follow-up Dr. Kadakia.  Echocardiogram with ejection fraction of 35 to 40% moderately reduced systolic function.  His amiodarone was resumed for heart rate control and myocardial ischemia.  Intravenous Cardizem was initiated and continued on telemetry with transition to PO diltiazem. Patient has been using nonrebreather mask at 4 L for comfort can use nasal cannula but prefers facemask if possible. He remains on subcutaneous heparin for DVT prophylaxis.  His diet has been advanced to a regular consistency. AKI felt to be related to diuresis creatinine 2.08. Metformin was held due to elevations in creatinine.Noted history of Crohn's colitis and did receive Remicade after contacts made to Dr.Wimmer [phone] of [facility]  Patient did have some bouts of urinary retention maintained on low-dose Urecholine. Klebsiella pneumo UTI completing course of Bactrim. Therapies have been resumed patient is admitted to inpatient rehab service to continue comprehensive rehab. °  °Review of Systems  °Constitutional: Negative for chills and fever.  °HENT: Negative for hearing loss.   °Eyes: Negative for blurred vision and double vision.  °Respiratory: Positive for cough and shortness of breath.   °Cardiovascular: Positive for palpitations.  °Gastrointestinal: Positive for constipation. Negative for heartburn, nausea and vomiting.  °Genitourinary: Positive for urgency. Negative for dysuria, flank pain and hematuria.  °Musculoskeletal: Positive for joint pain and myalgias.  °Skin: Negative for rash.  °Psychiatric/Behavioral: The patient has insomnia.   °All other systems reviewed and are negative. °  °    °Past Medical History:  °Diagnosis Date  °• Acute and chronic respiratory failure with hypoxia (HCC)    °• Acute myocardial infarction, subendocardial infarction, subsequent episode of care (HCC)    °•   Acute systolic heart failure  (HCC)    °• Atrial fibrillation (HCC)    °• Chronic atrial fibrillation    °• Crohn's colitis (HCC)    °• Diabetes mellitus without complication (HCC)    °• Hypertension    °• Sepsis with acute organ dysfunction (HCC)    °• Unspecified septicemia(038.9) (HCC)    °  °     °Past Surgical History:  °Procedure Laterality Date  °• CAROTID ENDARTERECTOMY Bilateral    °  °     °Family History  °Problem Relation Age of Onset  °• Bone cancer Brother    °  °Social History:  reports that he has been smoking. He has been smoking about 1.50 packs per day. He has never used smokeless tobacco. He reports previous drug use. No history on file for alcohol. °Allergies:  °    °Allergies  °Allergen Reactions  °• Penicillins    °  °      °Medications Prior to Admission  °Medication Sig Dispense Refill  °• inFLIXimab in sodium chloride 0.9 % Inject 5 mg/kg into the vein every 8 (eight) weeks. Premed with Tylenol 650 and Benadryl 25mg      °• amiodarone (PACERONE) 200 MG tablet Take 200 mg by mouth daily.      °• amLODipine (NORVASC) 10 MG tablet Take 10 mg by mouth daily.      °• atorvastatin (LIPITOR) 40 MG tablet Take 40 mg by mouth.      °• carvedilol (COREG) 6.25 MG tablet Take 6.25 mg by mouth 2 (two) times daily.      °• clonazePAM (KLONOPIN) 0.5 MG tablet Take 0.5 mg by mouth 2 (two) times daily as needed for anxiety.      °• hydrALAZINE (APRESOLINE) 50 MG tablet Take 50 mg by mouth 3 (three) times daily.      °• HYDROcodone-acetaminophen (NORCO) 7.5-325 MG tablet Take 1 tablet by mouth 3 (three) times daily as needed for pain.      °• lisinopril (PRINIVIL,ZESTRIL) 10 MG tablet Take 10 mg by mouth daily.      °• metFORMIN (GLUCOPHAGE) 1000 MG tablet Take 1,000 mg by mouth 2 (two) times daily.      °• ondansetron (ZOFRAN) 4 MG tablet Take 4 mg by mouth 3 (three) times daily as needed for nausea/vomiting.      °• pantoprazole (PROTONIX) 40 MG tablet Take 40 mg by mouth daily.      °• rOPINIRole (REQUIP) 1 MG tablet Take 2-4 mg by  mouth See admin instructions. Take 2mg in the morning as needed for restless legs and 3mg to 4mg one hour prior to bedtime as needed for restless legs.      °  °  °Drug Regimen Review °Drug regimen was reviewed and remains appropriate with no significant issues identified °  °  °Home: °Home Living °Family/patient expects to be discharged to:: Private residence °Living Arrangements: Other relatives(brother and sister in law) °Available Help at Discharge: Family, Available 24 hours/day °Type of Home: Mobile home °Home Access: Ramped entrance °Home Layout: One level °Bathroom Shower/Tub: Tub/shower unit °Bathroom Toilet: Standard °Home Equipment: Walker - 2 wheels, Shower seat ° Lives With: FamilyPatient lives with his sister, brother-in-law and niece.  One level home. °  °Functional History: °Prior Function °Level of Independence: Independent °Comments: could walk up to 100 feet at baseline °Independent with basic ADLs and driving.  Ambulating 50 feet. °Functional Status:  °Mobility: °Bed Mobility °Overal bed mobility: Modified Independent °General   bed mobility comments: HOB upMin mod assist mobility °Transfers °Overall transfer level: Needs assistance °Equipment used: Rolling walker (2 wheeled) °Transfers: Sit to/from Stand °Sit to Stand: Min assist °General transfer comment: steadying assist, pt with posterior lean and blocking knees on bed upon initially standing °  °ADL: Minimal to moderate assist upper and lower body ADLs °ADL °Overall ADL's : Needs assistance/impaired °Eating/Feeding: Independent, Sitting °Grooming: Set up, Sitting °Upper Body Bathing: Set up, Sitting °Lower Body Bathing: Moderate assistance, Sit to/from stand °Upper Body Dressing : Set up, Sitting °Lower Body Dressing: Moderate assistance, Sit to/from stand °Toilet Transfer: Minimal assistance, RW, +2 for safety/equipment °Toileting- Clothing Manipulation and Hygiene: Moderate assistance, Sit to/from stand °Functional mobility during ADLs:  Minimal assistance, Rolling walker °  °Cognition: °Cognition °Overall Cognitive Status: Within Functional Limits for tasks assessed °Orientation Level: Oriented X4 °Cognition °Arousal/Alertness: Awake/alert °Behavior During Therapy: Anxious °Overall Cognitive Status: Within Functional Limits for tasks assessed °General Comments: pt ruminates on past medical history °  °Physical Exam: °Blood pressure 122/79, pulse 72, temperature 97.8 °F (36.6 °C), temperature source Oral, resp. rate 13, height 5' 10" (1.778 m), weight 77.3 kg, SpO2 95 %. °Physical Exam  °Wearing nonrebreather mask °Neurological:  °Patient is alert but a bit anxious. Follows commands. Provides his name and age.  °Skin:  °Skin color is chronically gray  ° Lungs are clear ° °Heart tachycardic, no murmurs or extra sounds °Chest nontender to palpation °Abdomen mildly distended mildly tender to palpation diffusely.  No masses. °Extremities no clubbing cyanosis or edema °Motor strength is 4+ bilateral deltoid bicep tricep grip 3+ at bilateral hip flexors 4 at bilateral knee extensors 0 left ankle dorsiflexor to minus ankle plantar flexor right ankle 3- at the dorsiflexors and plantar flexors °Sensation absent to light touch and proprioception in the left foot and present although diminished on the right °  °Lab Results Last 48 Hours  °      °Results for orders placed or performed during the hospital encounter of 06/02/18 (from the past 48 hour(s))  °Glucose, capillary     Status: Abnormal  °  Collection Time: 06/04/18  6:21 AM  °Result Value Ref Range  °  Glucose-Capillary 252 (H) 70 - 99 mg/dL  °Glucose, capillary     Status: Abnormal  °  Collection Time: 06/04/18 12:49 PM  °Result Value Ref Range  °  Glucose-Capillary 236 (H) 70 - 99 mg/dL  °Glucose, capillary     Status: Abnormal  °  Collection Time: 06/04/18  5:16 PM  °Result Value Ref Range  °  Glucose-Capillary 181 (H) 70 - 99 mg/dL  °Glucose, capillary     Status: Abnormal  °  Collection Time:  06/04/18  9:48 PM  °Result Value Ref Range  °  Glucose-Capillary 209 (H) 70 - 99 mg/dL  °Basic metabolic panel     Status: Abnormal  °  Collection Time: 06/05/18  4:09 AM  °Result Value Ref Range  °  Sodium 137 135 - 145 mmol/L  °  Potassium 4.4 3.5 - 5.1 mmol/L  °  Chloride 103 98 - 111 mmol/L  °  CO2 20 (L) 22 - 32 mmol/L  °  Glucose, Bld 191 (H) 70 - 99 mg/dL  °  BUN 52 (H) 8 - 23 mg/dL  °  Creatinine, Ser 2.04 (H) 0.61 - 1.24 mg/dL  °  Calcium 9.2 8.9 - 10.3 mg/dL  °  GFR calc non Af Amer 32 (L) >60 mL/min  °  GFR calc   Af Amer 37 (L) >60 mL/min  °  Anion gap 14 5 - 15  °    Comment: Performed at [facility], [address] [place], [place] [address]  °Magnesium     Status: None  °  Collection Time: 06/05/18  4:09 AM  °Result Value Ref Range  °  Magnesium 2.4 1.7 - 2.4 mg/dL  °    Comment: Performed at [facility], [address] [place], [place] [address]  °Glucose, capillary     Status: Abnormal  °  Collection Time: 06/05/18  6:36 AM  °Result Value Ref Range  °  Glucose-Capillary 173 (H) 70 - 99 mg/dL  °Glucose, capillary     Status: Abnormal  °  Collection Time: 06/05/18 11:46 AM  °Result Value Ref Range  °  Glucose-Capillary 159 (H) 70 - 99 mg/dL  °Glucose, capillary     Status: Abnormal  °  Collection Time: 06/05/18  4:58 PM  °Result Value Ref Range  °  Glucose-Capillary 213 (H) 70 - 99 mg/dL  °Glucose, capillary     Status: Abnormal  °  Collection Time: 06/05/18  9:32 PM  °Result Value Ref Range  °  Glucose-Capillary 161 (H) 70 - 99 mg/dL  °Basic metabolic panel     Status: Abnormal  °  Collection Time: 06/06/18  3:43 AM  °Result Value Ref Range  °  Sodium 137 135 - 145 mmol/L  °  Potassium 4.8 3.5 - 5.1 mmol/L  °  Chloride 102 98 - 111 mmol/L  °  CO2 24 22 - 32 mmol/L  °  Glucose, Bld 191 (H) 70 - 99 mg/dL  °  BUN 65 (H) 8 - 23 mg/dL  °  Creatinine, Ser 2.08 (H) 0.61 - 1.24 mg/dL  °  Calcium 9.0 8.9 - 10.3 mg/dL  °  GFR calc non Af Amer 31 (L) >60 mL/min  °  GFR calc Af Amer 36 (L) >60  mL/min  °  Anion gap 11 5 - 15  °    Comment: Performed at [facility], [address] [place], [place] [address]  °Magnesium     Status: None  °  Collection Time: 06/06/18  3:43 AM  °Result Value Ref Range  °  Magnesium 2.2 1.7 - 2.4 mg/dL  °    Comment: Performed at [facility], [address] [place], [place] [address]  °  °  °Imaging Results (Last 48 hours)  °No results found.  ° °  °  °  °  °Medical Problem List and Plan: °1.  Debility secondary to acute on chronic respiratory failure with hypoxemia and hypercarbia. Wean O2 as tolerated goals oxygen saturation 89-92%Slow prednisone taper. Patient also with peripheral neuropathy left side foot drop/multi-medical °2.  Antithrombotics: °-DVT/anticoagulation: Subcutaneous heparin °            -antiplatelet therapy: Aspirin 81 mg daily °3. Pain Management/Chronic back pain: Hydrocodone and Robaxin as needed °4. Mood: Provide emotional support °            -antipsychotic agents: N/A °5. Neuropsych: This patient is capable of making decisions on his own behalf. °6. Skin/Wound Care: Routine skin checks °7. Fluids/Electrolytes/Nutrition: Routine in and outs with follow-up chemistries °8.  CAD with non-STEMI.  Continue aspirin.Follow-up per  cardiology services °9.  Atrial fibrillation.  Diltiazem 240 mg daily/amiodarone 200 mg daily.  Would like cardiology to continue to monitor his rate control as well as    his CHF Cardiac rate not controlled °Spoke with Dr. Kadakia, given the patient appeared anxious at the time, was complaining of some shortness of breath but with 100% O2 sats, will try a one-time dose of lorazepam 0.5 mg.  Dr. Kadakia plans to see the patient later this evening °10.  Hypertension.  Hydralazine 25 mg 3 times daily °11. Diastolic congestive heart failure. Monitor for any signs of fluid overload.Continue Lasix as directed °12. Urinary retention/Klebsiella pneumo UTI. Urecholine 10 mg 3 times a day, Flomax 0.4 mg daily. complete course of  Bactrim °13. Hyperlipidemia. Lipitor °14. Diabetes mellitus. Latest hemoglobin A1c 5.0. Presently onLantus insulin 7 units daily at bedtime. Patient on metformin 1000 mg twice a day prior to admission but held due to mildly elevated creatinine. Check blood sugars before meals and at bedtime. Monitor with taper of prednisone. Diabetic teaching °15. Crohn's colitis with history of GI bleed. Protonix 40 mg twice a day.Patient did receive Remicade 06/05/2018 and is scheduled every 8 weeks °16. AKI/renal insufficiency. Strict in and out's. Follow-up chemistries. °16. Tobacco abuse. Counseling °17. Acute on chronic anemia. Follow-up CBC ° °Post Admission Physician Evaluation: °1. Functional deficits secondary  to debility. °2. Patient admitted to receive collaborative, interdisciplinary care between the physiatrist, rehab nursing staff, and therapy team. °3. Patient's level of medical complexity and substantial therapy needs in context of that medical necessity cannot be provided at a lesser intensity of care. °4. Patient has experienced substantial functional loss from his/her baseline. Judging by the patient's diagnosis, physical exam, and functional history, the patient has potential for functional progress which will result in measurable gains while on inpatient rehab.  These gains will be of substantial and practical use upon discharge in facilitating mobility and self-care at the household level. °5. Physiatrist will provide 24 hour management of medical needs as well as oversight of the therapy plan/treatment and provide guidance as appropriate regarding the interaction of the two. °6. 24 hour rehab nursing will assist in the management of  bladder management, bowel management, safety, skin/wound care, disease management, medication administration, pain management and patient education  and help integrate therapy concepts, techniques,education, etc. °7. PT will assess and treat for: pre gait training, gait  training, NM re ed, endurance , safety, and  Equipment.  .  Goals are: independent with assistive device. °8. OT will assess and treat for   ADLs, Cognitive perceptual skills, Neuromuscular re education, safety, endurance, equipment  .  Goals are: supervision.  °9. SLP will assess and treat for  .  Goals are: N/A. °10. Case Management and Social Worker will assess and treat for psychological issues and discharge planning. °11. Team conference will be held weekly to assess progress toward goals and to determine barriers to discharge. °12.  Patient will receive at least 3 hours of therapy per day at least 5 days per week. °13. ELOS and Prognosis: 7-10d fair ° °"I have personally performed a face to face diagnostic evaluation of this patient.  Additionally, I have reviewed and concur with the physician assistant's documentation above." ° °Apollonia Amini E. Reiley Keisler M.D. °[facility] °FAAPM&R (Sports Med, Neuromuscular Med) °Diplomate Am Board of Electrodiagnostic Med ° °  °Daniel J Angiulli, PA-C °06/06/2018  °  °  °  ° ° °

## 2018-06-07 NOTE — Significant Event (Signed)
Rapid Response Event Note  Overview: Time Called: 0340 Arrival Time: 0343 Event Type: Cardiac-chest pain   Initial Focused Assessment: Richard Wang has 8/10 Chest pain that he describes as "weakness".  He also c/o SOB. He states he just returned from the restroom after trying to have a bowel movement. His discriptions of events and symptoms fluctuate and are not consistent.  He stated he had some pain to his right arm that came and went. 12 lead EKG shows Atrial Flutter with no acute changes. Pt states pain ceased after 2nd SL NTG.  HR 99, 108/76 (87), RR 18, Sats 94% on 2L [name]   Interventions: -Stat 12 lead EKG -Ntg 0.4 mg x3 q 5 mins or til pain resolved -2L [name]   Plan of Care (if not transferred): -Notify primary provider -monitor for new chest pain -Please call for any further assistance  Event Summary: Stayed in room Call ended [phone]  [name]

## 2018-06-07 NOTE — Care Management (Signed)
Inpatient Rehabilitation Center Individual Statement of Services  Patient Name:  Richard Wang  Date:  06/07/2018  Welcome to the Inpatient Rehabilitation Center.  Our goal is to provide you with an individualized program based on your diagnosis and situation, designed to meet your specific needs.  With this comprehensive rehabilitation program, you will be expected to participate in at least 3 hours of rehabilitation therapies Monday-Friday, with modified therapy programming on the weekends.  Your rehabilitation program will include the following services:  Physical Therapy (PT), Occupational Therapy (OT), 24 hour per day rehabilitation nursing, Therapeutic Recreaction (TR), Neuropsychology, Case Management (Social Worker), Rehabilitation Medicine, Nutrition Services and Pharmacy Services  Weekly team conferences will be held on Wednesdays to discuss your progress.  Your Social Worker will talk with you frequently to get your input and to update you on team discussions.  Team conferences with you and your family in attendance may also be held.  Expected length of stay: 10-14 days  Overall anticipated outcome: supervision  Depending on your progress and recovery, your program may change. Your Social Worker will coordinate services and will keep you informed of any changes. Your Social Worker's name and contact numbers are listed  below.  The following services may also be recommended but are not provided by the Inpatient Rehabilitation Center:   Driving Evaluations  Home Health Rehabiltiation Services  Outpatient Rehabilitation Services   Arrangements will be made to provide these services after discharge if needed.  Arrangements include referral to agencies that provide these services.  Your insurance has been verified to be:  Medicare Your primary doctor is:  Joelene Millin  Pertinent information will be shared with your doctor and your insurance company.  Social Worker:  [place], [place]  [phone] or ([phone]   Information discussed with and copy given to patient by: Amada Jupiter, 06/07/2018, 4:52 PM

## 2018-06-07 NOTE — Progress Notes (Signed)
Occupational Therapy Session Note  Patient Details  Name: Richard Wang MRN: [id number] Date of Birth: August 06, 1945  Today's Date: 06/07/2018 OT Individual Time: 1420-1450 OT Individual Time Calculation (min): 30 min    Short Term Goals: Week 1:  OT Short Term Goal 1 (Week 1): Pt will tolerate sitting EOB for 5 mins in preparation for BADL task OT Short Term Goal 2 (Week 1): Pt will complete sit<>stand with mod A of 1 OT Short Term Goal 3 (Week 1): Pt will complete 1 step of LB dressing  Skilled Therapeutic Interventions/Progress Updates:    Patient in bed and states that he is not feeling well today.  SOB with conversation.  He is agreeable to change of position in bed and some self care to assist with feeling better.  Oral care completed with set up assist.  Shampoo cap donned and patient assisted with combing hair and washing face.  He declined sitting edge of bed or getting OOB due to fatigue.  Reviewed optimal positioning for improved breathing.  Patient able to adjust [facility] accordingly.  He was pleasant and aware of needs.  He remained in bed with call bell in reach and bed alarm set.    Therapy Documentation Precautions:  Precautions Precautions: Fall Precaution Comments: watch O2 and HR Restrictions Weight Bearing Restrictions: No General: General OT Amount of Missed Time: 30 Minutes Vital Signs: Therapy Vitals Temp: 98.2 F (36.8 C) Temp Source: Oral Pulse Rate: 97 Resp: 19 BP: 104/74 Patient Position (if appropriate): Lying Oxygen Therapy SpO2: 91 % O2 Device: Nasal Cannula O2 Flow Rate (L/min): 6 L/min Pain: Pain Assessment Pain Scale: 0-10 Pain Score: 0-No pain   Other Treatments:     Therapy/Group: Individual Therapy  Barrie Lyme 06/07/2018, 3:38 PM

## 2018-06-07 NOTE — Progress Notes (Signed)
Note that below is a copy of the initial SW assessment with pt. No significant changes needed.  Pt happy to return to CIR but is expressing worry about his ongoing CP and SOB.  He is hopeful he can reach the target goals of supervision.  Plan still to return home with family providing 24/7 assistance.  Amada Jupiter, LCSW       Social Work  Social Work Assessment and Plan  Patient Details  Name: Richard Wang MRN: [id number] Date of Birth: Feb 14, 1946  Today's Date: 05/20/2018  Problem List:      Patient Active Problem List   Diagnosis Date Noted  . Urinary retention   . Chronic respiratory failure (HCC)   . Atrial fibrillation (HCC)   . Labile blood pressure   . Essential hypertension   . Steroid-induced hyperglycemia   . Normocytic anemia   . Acute on chronic respiratory failure (HCC)   . Leucocytosis   . Hypoalbuminemia due to protein-calorie malnutrition (HCC)   . Urinary frequency   . Debility 05/17/2018  . Acute and chronic respiratory failure with hypoxia (HCC)   . Acute systolic heart failure (HCC)   . Chronic atrial fibrillation   . Acute myocardial infarction, subendocardial infarction, subsequent episode of care (HCC)   . Unspecified septicemia(038.9) (HCC)   . Sepsis with acute organ dysfunction (HCC)   . Acute and chronic respiratory failure with hypoxia [facility])    Past Medical History:      Past Medical History:  Diagnosis Date  . Acute and chronic respiratory failure with hypoxia (HCC)   . Acute myocardial infarction, subendocardial infarction, subsequent episode of care (HCC)   . Acute systolic heart failure (HCC)   . Atrial fibrillation (HCC)   . Chronic atrial fibrillation   . Crohn's colitis (HCC)   . Diabetes mellitus without complication (HCC)   . Hypertension   . Sepsis with acute organ dysfunction (HCC)   . Unspecified septicemia(038.9) (HCC)    Past Surgical History: History reviewed. No pertinent surgical  history. Social History:  reports that he has been smoking. He has been smoking about 1.50 packs per day. He has never used smokeless tobacco. He reports previous drug use. No history on file for alcohol.  Family / Support Systems Marital Status: Widow/Widower How Long?: 2007 Patient Roles: Parent Children: (pt reports he has one son living in [place], however, has no contact with him) Other Supports: sister, Dimas Millin @ [phone];  Orlie Dakin, Jana Hakim (lives close by) @ [phone] Anticipated Caregiver: niece and sister Ability/Limitations of Caregiver: this neice does not live with pt and her parents Caregiver Availability: 24/7 Family Dynamics: Pt reports he has been living with sister and her family for ~ 4 yrs.  Good relationship with all.  Notes neice, Danielle, "... is the one who keeps [place] all straight"  Social History Preferred language: English Religion:  Cultural Background: NA Read: Yes Write: Yes Employment Status: Retired Marine scientist Issues: None Guardian/Conservator: None - per MD, pt is capable of making decisions on his own behalf.   Abuse/Neglect Abuse/Neglect Assessment Can Be Completed: Yes Physical Abuse: Denies Verbal Abuse: Denies Sexual Abuse: Denies Exploitation of patient/patient's resources: Denies Self-Neglect: Denies  Emotional Status Pt's affect, behavior and adjustment status: Pt sitting up in w/c, very pleasant but admits he is very frustration with his situation and limitations.  EAger to regain strength and return home.  Pt denies any significant emotional distress, however, may benefit from neuropsychology support while here.  Will monitor. Recent Psychosocial Issues: Pt living in a mobile home with 3 other family members and all with chronic health issues.  They are able to care for themselves individually. Psychiatric History: None Substance Abuse History: None  Patient / Family Perceptions, Expectations &  Goals Pt/Family understanding of illness & functional limitations: Pt and family with good, general understanding of his medical course and of current functional limitations/ need for CIR. Premorbid pt/family roles/activities: Pt was independent overall, however, limited mobility at home / community. Anticipated changes in roles/activities/participation: Little change anticipated if pt able to reach supervison goals. Pt/family expectations/goals: "I just need to get my strength back."  [employer]: None Premorbid Home Care/DME Agencies: None Transportation available at discharge: yes Resource referrals recommended: Neuropsychology  Discharge Planning Living Arrangements: Other relatives Support Systems: Other relatives Type of Residence: Private residence Insurance Resources: Electrical engineer Resources: Restaurant manager, fast food Screen Referred: No Living Expenses: Lives with family Money Management: Patient, Family Does the patient have any problems obtaining your medications?: No Home Management: pt and family share responsibilities Patient/Family Preliminary Plans: Pt plans to return home with family.  Veatrice Kells, to be primary support to the entire household. Social Work Anticipated Follow Up Needs: HH/OP Expected length of stay: 18-21 days  Clinical Impression Pleasant gentleman here following resp failure and lengthy hospitalization.  Living in mobile home with 3 other adult family members who are also with chronic health issues.  Local niece provides oversight and assist to them.  Pt feels he will have all needed help he needs.  Denies any significant emotional distress, however, affect is rather flat.  May benefit from neuropsychology support.  Will monitor.  Vale Mousseau 05/20/2018, 2:26 PM

## 2018-06-07 NOTE — Evaluation (Signed)
Occupational Therapy Assessment and Plan  Patient Details  Name: Richard Wang MRN: [id number] Date of Birth: 08/08/1945  OT Diagnosis: muscle weakness (generalized) and decreased activity tolerance Rehab Potential: Rehab Potential (ACUTE ONLY): Fair ELOS: 12-14 days   Today's Date: 06/07/2018 OT Individual Time: 2992-4268 OT Individual Time Calculation (min): 25 min     Problem List:  Patient Active Problem List   Diagnosis Date Noted  . Transaminitis   . Hyperkalemia   . Hyponatremia   . Acute on chronic anemia   . Diabetes mellitus type 2 in nonobese (HCC)   . Acute combined systolic and diastolic congestive heart failure ([place])   . Coronary artery disease involving native coronary artery of native heart with unstable angina pectoris ([place])   . Chronic pain syndrome   . Anxiety state   . Diabetes mellitus type 2, noninsulin dependent ([place])   . FTT (failure to thrive) in adult   . AKI (acute kidney injury) ([place]) 06/03/2018  . 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 Crohn's disease ([place]) 06/03/2018  . Atrial fibrillation with RVR ([place]) 06/02/2018  . Shortness of breath   . Hypokalemia   . Acute blood loss anemia   . Labile blood glucose   . Urinary retention   . Chronic respiratory failure ([place])   . Atrial fibrillation ([place])   . Labile blood pressure   . Essential hypertension   . Steroid-induced hyperglycemia   . Normocytic anemia   . Acute on chronic respiratory failure ([place])   . Leucocytosis   . Hypoalbuminemia due to protein-calorie malnutrition ([place])   . Urinary frequency   . Debility 05/17/2018  . Acute and chronic respiratory failure with hypoxia ([place])   . Acute systolic heart failure ([place])   . Chronic atrial fibrillation   . Acute myocardial infarction, subendocardial infarction, subsequent episode of care ([place])   . Unspecified septicemia(038.9) ([place])   . Sepsis with acute organ dysfunction ([place])   . Acute and chronic respiratory failure with hypoxia  [facility])     Past Medical History:  Past Medical History:  Diagnosis Date  . Acute and chronic respiratory failure with hypoxia ([place])   . Acute myocardial infarction, subendocardial infarction, subsequent episode of care ([place])   . Acute systolic heart failure ([place])   . Atrial fibrillation ([place])   . Chronic atrial fibrillation   . Crohn's colitis ([place])   . Diabetes mellitus without complication ([place])   . Hypertension   . Sepsis with acute organ dysfunction ([place])   . Unspecified septicemia(038.9) [facility])    Past Surgical History:  Past Surgical History:  Procedure Laterality Date  . CAROTID ENDARTERECTOMY Bilateral     Assessment & Plan Clinical Impression: Patient is a 73 y.o. year old male with history of COPD with remote tobacco abuse not on home oxygen at baseline, Crohn's colitis/GI bleed, diabetes mellitus, chronic low back pain with foot drop, CAD with non-STEMI maintained on aspirin, renal insufficiency, atrial fibrillation and hypertension.  Patient initially presented to [facility] with increasing shortness of breath and lower extremity edema.  Chest x-ray and echocardiogram showed moderate LV systolic dysfunction and moderate to severe MR and moderate pulmonary systolic hypertension consistent with CHF.  Patient was started on BiPAP however deteriorated and ended up having to be intubated placed on the ventilator.  Patient subsequently complications associated with worsening renal function, congestive heart failure.  Attempts at extubation failed and he was transferred to [place] hospital 04/26/2018 for ventilation wean.  Initially on IV Solu-Medrol transition to PO prednisone after ventilation discontinued.  Tracheostomy tube was not needed.  Patient later extubated successfully and monitored.  Continued on prednisone with slow taper.  Subcutaneous heparin for DVT prophylaxis.  Remained on aspirin therapy as well as Cardizem for history of atrial fibrillation.       Therapy evaluations completed and patient was admitted for a comprehensive rehab program 05/17/2018.  Patient with steady progressive gains.  Noted on 06/02/2018 around 2 PM developed rapid heart rate nonspecific chest pain increasing shortness of breath and hypoxia.  He did receive nitro x2 and morphine 2 mg x 2 with minimal relief.  He was saturating well on 8 L of oxygen.  Heart rate noted to be in the 160s.  Troponin elevated 0.14-1.24.  EKG showed rapid rate possibly some worsening heart block.  Chest x-ray concerning for mild bilateral pulmonary edema.  He was discharged to acute care services for ongoing evaluation 06/02/2018.     Cardiology service follow-up Dr. Doylene Canard.  Echocardiogram with ejection fraction of 35 to 40% moderately reduced systolic function.  His amiodarone was resumed for heart rate control and myocardial ischemia.  Intravenous Cardizem was initiated and continued on telemetry with transition to PO diltiazem. Patient has been using nonrebreather mask at 4 L for comfort can use nasal cannula but prefers facemask if possible.  Patient transferred to CIR on 06/06/2018 .    Patient currently requires total with basic self-care skills secondary to muscle weakness, decreased cardiorespiratoy endurance and decreased oxygen support and decreased sitting balance, decreased standing balance and decreased balance strategies.  Prior to hospitalization, patient could complete BADL with independent .  Patient will benefit from skilled intervention to increase independence with basic self-care skills prior to discharge home with care partner.  Anticipate patient will require 24 hour supervision and follow up home health.  OT - End of Session Activity Tolerance: Decreased this session Endurance Deficit: Yes Endurance Deficit Description: severe debility, pt unable to tolerate OOB activity 2/2 SOB and labored breathing OT Assessment Rehab Potential (ACUTE ONLY): Fair OT Barriers to Discharge:  Other (comments) OT Patient demonstrates impairments in the following area(s): Balance;Endurance;Motor;Pain;Perception;Safety OT Basic ADL's Functional Problem(s): Eating;Grooming;Bathing;Dressing;Toileting OT Transfers Functional Problem(s): Toilet;Tub/Shower OT Additional Impairment(s): None OT Plan OT Intensity: Minimum of 1-2 x/day, 45 to 90 minutes OT Frequency: 5 out of 7 days OT Duration/Estimated Length of Stay: 12-14 days OT Treatment/Interventions: Balance/vestibular training;Disease mangement/prevention;Neuromuscular re-education;Self Care/advanced ADL retraining;Therapeutic Exercise;Wheelchair propulsion/positioning;Cognitive remediation/compensation;DME/adaptive equipment instruction;Pain management;UE/LE Strength taining/ROM;Community reintegration;Functional electrical stimulation;Patient/family education;UE/LE Coordination activities;Discharge planning;Functional mobility training;Psychosocial support;Therapeutic Activities OT Self Feeding Anticipated Outcome(s): mod I OT Basic Self-Care Anticipated Outcome(s): Supervision OT Toileting Anticipated Outcome(s): Supervision OT Bathroom Transfers Anticipated Outcome(s): Supervision OT Recommendation Recommendations for Other Services: Neuropsych consult Patient destination: Home Follow Up Recommendations: Home health OT Equipment Recommended: To be determined   Skilled Therapeutic Intervention OT eval completed addressing rehab process, OT purpose, POC, ELOS, and goals.  Limited ADL assessment secondary to patient feeling short of breath and declining to particpate. Pt greeted semi-reclined in bed with nasal canula in bed beside him, but not on. Pt with chest tightness and SOB just sitting in bed on room air. OT checked O2 sats with pt at 79 on room air. Pt placed back on nasal canula on 3L and encouraged deep breathing strategies. Pt stated " I can't" when asked him to take deep breaths through his nose. Pt recovered to 93%. Pt  agreeable to MMT in the bed but unable  to participate further in evaluation. Pt left semi-reclined in bed with nasal canula on, bed alarm on, and needs met.   OT Evaluation Precautions/Restrictions  Precautions Precautions: Fall Precaution Comments: watch O2 and HR Restrictions Weight Bearing Restrictions: No General OT Amount of Missed Time: 50 Minutes OT Missed Treatment Reason: Patient fatigue;Other (Comment)(SOB/chest pain) Vital Signs Therapy Vitals Pulse Rate: 80 BP: 108/80 Patient Position (if appropriate): Lying Oxygen Therapy SpO2: (!) 79 %(recovered to 93 with 3L) O2 Device: Room Air O2 Flow Rate (L/min): 2 L/min Patient Activity (if Appropriate): In bed Pain Pain Assessment Pain Scale: 0-10 Pain Score: 0-No pain Pain Type: Other (Comment)(chest pain) Pain Location: Chest Pain Orientation: Mid Pain Onset: On-going Pain Intervention(s): RN made aware Home Living/Prior [place] expects to be discharged to:: Private residence Living Arrangements: Other relatives Available Help at Discharge: Family, Available 24 hours/day Type of Home: Mobile home Home Access: Ramped entrance Home Layout: One level Bathroom Shower/Tub: Optometrist: Yes  Lives With: Family Prior Function Level of Independence: Independent with basic ADLs, Independent with homemaking with ambulation Driving: Yes Vocation: Retired Comments: could walk up to 100 feet at baseline ADL ADL Eating: (UTA) Grooming: Setup(washed face in bed with set-up ) Upper Body Bathing: (UTA) ADL Comments: UTA BADLs 2/2 SOB and pt declining to participate Vision Baseline Vision/History: Wears glasses Wears Glasses: Reading only Patient Visual Report: No change from baseline Vision Assessment?: No apparent visual deficits Perception  Perception: Within Functional Limits Praxis Praxis: Intact Cognition Overall Cognitive  Status: Within Functional Limits for tasks assessed Arousal/Alertness: Awake/alert Orientation Level: Person;Place;Situation Person: Oriented Place: Oriented Situation: Oriented Year: 2020 Month: April Day of Week: Correct Memory: Appears intact Memory Impairment: Storage deficit Immediate Memory Recall: Sock;Blue;Bed Memory Recall: Sock(1/3) Memory Recall Sock: Without Cue Attention: Selective;Sustained Sustained Attention: Appears intact Selective Attention: Impaired Selective Attention Impairment: Functional complex;Verbal complex;Functional basic;Verbal basic Awareness: Appears intact Awareness Impairment: Emergent impairment;Anticipatory impairment Problem Solving: Appears intact Problem Solving Impairment: Functional basic;Functional complex;Verbal complex;Verbal basic Safety/Judgment: Appears intact Sensation Sensation Light Touch: Impaired Detail Peripheral sensation comments: decreased in LEs Light Touch Impaired Details: Impaired RLE;Impaired LLE(distal>proximal) Coordination Gross Motor Movements are Fluid and Coordinated: No Fine Motor Movements are Fluid and Coordinated: Yes Finger Nose Finger Test: [facility] Heel Shin Test: impaired bilaterally 2/2 weakness Motor  Motor Motor: Other (comment) Motor - Skilled Clinical Observations: generalized weakness, L foot drop  Mobility  Bed Mobility Bed Mobility: Rolling Left;Rolling Right Rolling Right: Supervision/verbal cueing Rolling Left: Supervision/Verbal cueing Supine to Sit: (UTA)  Trunk/Postural Assessment  Cervical Assessment Cervical Assessment: Exceptions to WFL(forwarded head) Thoracic Assessment Thoracic Assessment: Within Functional Limits Lumbar Assessment Lumbar Assessment: Exceptions to WFL(posterior pelvic tilt) Postural Control Postural Control: Within Functional Limits  Balance Balance Balance Assessed: No(UTA balance on eval) Extremity/Trunk Assessment RUE Assessment RUE Assessment:  Exceptions to [facility] Passive Range of Motion (PROM) Comments: WFLs Active Range of Motion (AROM) Comments: X General Strength Comments: 2+/5 RUE Strength Right Shoulder Flexion: 2+/5 LUE Assessment LUE Assessment: Exceptions to [facility] Passive Range of Motion (PROM) Comments: WFLs Active Range of Motion (AROM) Comments: x General Strength Comments: 2+/5   Refer to Care Plan for Long Term Goals  Recommendations for other services: Neuropsych   Discharge Criteria: Patient will be discharged from OT if patient refuses treatment 3 consecutive times without medical reason, if treatment goals not met, if there is a change in medical status, if patient makes no progress towards goals or if patient is  discharged from hospital.  The above assessment, treatment plan, treatment alternatives and goals were discussed and mutually agreed upon: by patient  Valma Cava 06/07/2018, 11:37 AM

## 2018-06-08 ENCOUNTER — Inpatient Hospital Stay (HOSPITAL_COMMUNITY): Payer: Medicare Other | Admitting: Occupational Therapy

## 2018-06-08 LAB — BASIC METABOLIC PANEL
Anion gap: 15 (ref 5–15)
BUN: 85 mg/dL — ABNORMAL HIGH (ref 8–23)
CO2: 20 mmol/L — ABNORMAL LOW (ref 22–32)
Calcium: 9.6 mg/dL (ref 8.9–10.3)
Chloride: 100 mmol/L (ref 98–111)
Creatinine, Ser: 2.56 mg/dL — ABNORMAL HIGH (ref 0.61–1.24)
GFR calc Af Amer: 28 mL/min — ABNORMAL LOW (ref >60)
GFR calc non Af Amer: 24 mL/min — ABNORMAL LOW (ref >60)
Glucose, Bld: 148 mg/dL — ABNORMAL HIGH (ref 70–99)
Potassium: 4.8 mmol/L (ref 3.5–5.1)
Sodium: 135 mmol/L (ref 135–145)

## 2018-06-08 LAB — CBC
HCT: 26.3 % — ABNORMAL LOW (ref 39.0–52.0)
Hemoglobin: 8 g/dL — ABNORMAL LOW (ref 13.0–17.0)
MCH: 26.5 pg (ref 26.0–34.0)
MCHC: 30.4 g/dL (ref 30.0–36.0)
MCV: 87.1 fL (ref 80.0–100.0)
Platelets: 191 10*3/uL (ref 150–400)
RBC: 3.02 MIL/uL — ABNORMAL LOW (ref 4.22–5.81)
RDW: 17.2 % — ABNORMAL HIGH (ref 11.5–15.5)
WBC: 9 10*3/uL (ref 4.0–10.5)
nRBC: 0 % (ref 0.0–0.2)

## 2018-06-08 LAB — TROPONIN I: Troponin I: 0.27 ng/mL (ref <0.03)

## 2018-06-08 LAB — CK TOTAL AND CKMB (NOT AT ARMC)
CK, MB: 1.9 ng/mL (ref 0.5–5.0)
Relative Index: INVALID (ref 0.0–2.5)
Total CK: 16 U/L — ABNORMAL LOW (ref 49–397)

## 2018-06-08 LAB — CULTURE, BLOOD (ROUTINE X 2)
Culture: NO GROWTH
Culture: NO GROWTH
Special Requests: ADEQUATE
Special Requests: ADEQUATE

## 2018-06-08 LAB — GLUCOSE, CAPILLARY
Glucose-Capillary: 145 mg/dL — ABNORMAL HIGH (ref 70–99)
Glucose-Capillary: 104 mg/dL — ABNORMAL HIGH (ref 70–99)
Glucose-Capillary: 242 mg/dL — ABNORMAL HIGH (ref 70–99)
Glucose-Capillary: 201 mg/dL — ABNORMAL HIGH (ref 70–99)

## 2018-06-08 MED ORDER — LEVALBUTEROL HCL 0.63 MG/3ML IN NEBU
INHALATION_SOLUTION | RESPIRATORY_TRACT | Status: AC
  Administered 2018-06-08: 22:00:00 0.63 mg
  Filled 2018-06-08: qty 6

## 2018-06-08 MED ORDER — HYOSCYAMINE SULFATE ER 0.375 MG PO TB12
0.3750 mg | ORAL_TABLET | Freq: Two times a day (BID) | ORAL | Status: DC
  Administered 2018-06-08 – 2018-06-12 (×9): 0.375 mg via ORAL
  Filled 2018-06-08 (×10): qty 1

## 2018-06-08 NOTE — Progress Notes (Signed)
MD notified of critical lab value troponin 0.27.

## 2018-06-08 NOTE — Significant Event (Signed)
Rapid Response Event Note  Overview: Time Called: 0525 Arrival Time: 0528 Event Type: Cardiac  Initial Focused Assessment: -4/10 CP with some radiation to the right chest -EKG= Afib with no acute ST changes -Troponins trending down today -Some SOB with anxiety  HR 72 afib, 103/64(74), RR 16 with sats 96% on 4LNC after NTG  Interventions: -Stat EKG -SL NTG x3 q 5 mins previously ordered  Plan of Care (if not transferred): -Continue to monitor for CP -Contact primary svc -Adjunct therapy for additional pain control  Event Summary: Pain reduced to 3 or less following SL NTG. Contacting primary svc for further orders.  Pt stated it is getting better.  Call ended [phone]  [name]

## 2018-06-08 NOTE — Progress Notes (Signed)
Occupational Therapy Session Note  Patient Details  Name: Richard Wang MRN: [id number] Date of Birth: 1945/05/18  Today's Date: 06/08/2018 OT Individual Time: 9937-1696 OT Individual Time Calculation (min): 72 min   Short Term Goals: Week 1:  OT Short Term Goal 1 (Week 1): Pt will tolerate sitting EOB for 5 mins in preparation for BADL task OT Short Term Goal 2 (Week 1): Pt will complete sit<>stand with mod A of 1 OT Short Term Goal 3 (Week 1): Pt will complete 1 step of LB dressing  Skilled Therapeutic Interventions/Progress Updates:    Pt greeted in bed eating lunch. No c/o pain and recent spell of dizziness had absolved per report. With encouragement, he was agreeable to eat EOB. Supine<sit with supervision. 02 sats on 4L while sitting upright 96% during meal. After, he was agreeable to complete bathing while attempting to have BM on BSC. Stand pivot<BSC completed with Min A and vcs. While sitting, he completed UB/LB bathing sit<stand with RW. Able to utilize figure 4 bilaterally for washing feet and donning gripper socks. Vcs required for sustained attention to task as he was very chatty. Min A for standing balance while he completed perihygiene, however due to decreased standing endurance, he needed assist for thoroughness. Stand pivot<bed using device completed with Min A and he returned to bed with supervision. Able to boost himself up in bed with use of headboard. Pt was left with all needs within reach and bed alarm set.   02 sats throughout session remained between 96-97%, including post transfers.   Therapy Documentation Precautions:  Precautions Precautions: Fall Precaution Comments: watch O2 and HR Restrictions Weight Bearing Restrictions: No Vital Signs: Oxygen Therapy SpO2: 95 % O2 Device: Nasal Cannula O2 Flow Rate (L/min): 4 L/min Pain: Pain Assessment Pain Scale: 0-10 Pain Score: 6  Pain Location: Back Pain Frequency: Constant Pain Onset: On-going Pain  Intervention(s): Medication (See eMAR) ADL: ADL Eating: (UTA) Grooming: Setup(washed face in bed with set-up ) Upper Body Bathing: (UTA) ADL Comments: UTA BADLs 2/2 SOB and pt declining to participate      Therapy/Group: Individual Therapy  Aeriana Speece A Elric Tirado 06/08/2018, 3:51 PM

## 2018-06-08 NOTE — Progress Notes (Signed)
Patient c/o of unable to catch his breath sats checked 98% at 4LPM ; HR 44-100 ; anxiety med given; reassured pt. MD made aware.

## 2018-06-08 NOTE — Progress Notes (Signed)
[place] PHYSICAL MEDICINE & REHABILITATION PROGRESS NOTE  Subjective/Complaints: Pt with chest pain this morning. Similar to what he's been experiencing before. Denied SOB when I questioned him. Complains about abdominal cramping from his crohn's disease as well as pain from his low back. Frustrated that he's having multiple pain generators.   ROS: Patient denies fever, rash, sore throat, blurred vision, nausea, vomiting, diarrhea, cough, shortness of breath or chest pain,   headache, or mood change.    Objective: Vital Signs: Blood pressure 110/67, pulse (!) 101, temperature 97.6 F (36.4 C), temperature source Oral, resp. rate 16, weight 78.1 kg, SpO2 95 %. No results found. Recent Labs    06/07/18 0637 06/08/18 0544  WBC 12.4* 9.0  HGB 8.4* 8.0*  HCT 27.4* 26.3*  PLT 231 191   Recent Labs    06/07/18 0637 06/08/18 0554  NA 131* 135  K 5.7* 4.8  CL 100 100  CO2 17* 20*  GLUCOSE 243* 148*  BUN 80* 85*  CREATININE 2.47* 2.56*  CALCIUM 9.0 9.6    Physical Exam: BP 110/67 (BP Location: Left Arm)   Pulse (!) 101   Temp 97.6 F (36.4 C) (Oral)   Resp 16   Wt 78.1 kg   SpO2 95%   BMI 24.71 kg/m  Constitutional: No distress . Vital signs reviewed. HEENT: EOMI, oral membranes moist Neck: supple Cardiovascular: IRR with murmur.    Respiratory: CTA Bilaterally without wheezes or rales. Normal effort. Tenderness to palpation along sternum and right>left chest wall   GI: BS +, non-tender, non-distended  Musc: No edema or tenderness in extremities. Neurological: Patient is alert  Follows commands.  Skin: Color is chronically grayin face Motor: Bilateral upper extremities: 4+/5 proximal distal Right lower extremity: Hip flexion, knee extension 2+/5, ankle dorsiflexion 4 medicine/5 Left lower extremity: Hip flexion, knee extension 2+/5, ankle dorsiflexion 1/5  Psych: anxious  Assessment/Plan: 1. Functional deficits secondary to chronic respiratory failure with  multiple medical complications, including cardiac and rheumatological which require 3+ hours per day of interdisciplinary therapy in a comprehensive inpatient rehab setting.  Physiatrist is providing close team supervision and 24 hour management of active medical problems listed below.  Physiatrist and rehab team continue to assess barriers to discharge/monitor patient progress toward functional and medical goals  Care Tool:  Bathing              Bathing assist Assist Level: Supervision/Verbal cueing     Upper Body Dressing/Undressing Upper body dressing   What is the patient wearing?: Hospital gown only    Upper body assist Assist Level: Minimal Assistance - Patient > 75%    Lower Body Dressing/Undressing Lower body dressing      What is the patient wearing?: Incontinence brief     Lower body assist Assist for lower body dressing: Minimal Assistance - Patient > 75%     Toileting Toileting    Toileting assist Assist for toileting: Moderate Assistance - Patient 50 - 74%     Transfers Chair/bed transfer  Transfers assist  Chair/bed transfer activity did not occur: Refused  Chair/bed transfer assist level: Minimal Assistance - Patient > 75%     Locomotion Ambulation   Ambulation assist   Ambulation activity did not occur: Safety/medical concerns          Walk 10 feet activity   Assist  Walk 10 feet activity did not occur: Safety/medical concerns        Walk 50 feet activity   Assist Walk 50  feet with 2 turns activity did not occur: Safety/medical concerns         Walk 150 feet activity   Assist Walk 150 feet activity did not occur: Safety/medical concerns         Walk 10 feet on uneven surface  activity   Assist Walk 10 feet on uneven surfaces activity did not occur: Safety/medical concerns         Wheelchair     Assist     Wheelchair activity did not occur: Safety/medical concerns         Wheelchair 50 feet  with 2 turns activity    Assist    Wheelchair 50 feet with 2 turns activity did not occur: Safety/medical concerns       Wheelchair 150 feet activity     Assist Wheelchair 150 feet activity did not occur: Safety/medical concerns          Medical Problem List and Plan: 1.Debilitysecondary to acute on chronic respiratory failurewith hypoxemia and hypercarbia, CAD/CHF with history of colitis on Remicade.  --Continue CIR therapies including PT, OT  2. Antithrombotics: -DVT/anticoagulation:Subcutaneous heparin -antiplatelet therapy: Aspirin 81 mg daily 3. Pain Management/Chronic back pain:Hydrocodone and Robaxin as needed  Needs regular encouragement  -kpad for chest wall, prn ice  -add levbid for GI cramping 4. Mood:Provide emotional support  Klonopin 3 times daily started on 4/17 -antipsychotic agents: N/A 5. Neuropsych: This patientiscapable of making decisions on hisown behalf. 6. Skin/Wound Care:Routine skin checks 7. Fluids/Electrolytes/Nutrition:Routine in and outs 8.CAD with non-STEMI. Continue aspirin. Follow-up per cardiology services  Anxiety likely large contributing factor to chest pain, however objective findings present as well  EKG with afib, nonspecific ST, T wave abnl. Appears improved from yesterday, certainly stable  -troponin actually trending DOWN  -cards following along  -continue to monitor closely  -treat musculoskeletal pain and anxiety 9.Atrial fibrillation. Diltiazem daily/amiodarone daily/hydralazine/metoprolol.   Medications being adjusted by Cards, appreciate recs  Heart rate remains elevated on 4/18, consider increase of BB 10.Hypertension.  See #9  Monitor with increased mobility 11. Diastolic congestive heart failure. Monitor for any signs of fluid overload.Continue Lasix as directed  Daily weights ordered [facility] Weights   06/08/18 0702  Weight: 78.1 kg   12. Urinary  retention/Klebsiella pneumo UTI. Urecholine 10 mg 3 times a day, Flomax 0.4 mg daily. complete course of Bactrim through 4/21 13. Hyperlipidemia. Lipitor 14.  Steroid-induced hyperglycemia with hx of DM. Latest hemoglobin A1c 5.0.   Patient on metformin 1000 mg twice a day prior to admissionbut held due to mildly elevated creatinine.   Presently on Lantus insulin 7 units daily at bedtime.   Check blood sugars before meals and at bedtime. Diabetic teaching  Monitor with taper of prednisone.    Some improvement--no changes today 15. Crohn's colitis with history of GI bleed. Protonix 40 mg twice a day.Patientdid receive Remicade 06/05/2018 and is scheduled every 8 weeks  Need to be particularly cautious due to immunosuppression  -add levbid for prn for GI spasms 16. AKI/renal insufficiency. Strict in and out's.   Creatinine 2.56 4/18  Encourage fluids 16. Tobacco abuse. Counseling 17. Acute on chronic anemia.   Hemoglobin 8.0 today, has been trending down for the last week  -no gross blood  Labs ordered for Monday 18. Hyponatremia  Sodium 135   Labs ordered for Monday 19.  Hyperkalemia  Potassium 4.8  Labs ordered for Monday 20.  Transaminitis  ALT elevated on 4/17  Labs ordered for Monday 21.  Leukocytosis  WBCs 9.0, steroid driven, afebrile  Afebrile    35 min time spent   LOS: 2 days A FACE TO FACE EVALUATION WAS PERFORMED  Ranelle Oyster 06/08/2018, 8:51 AM

## 2018-06-08 NOTE — Consult Note (Signed)
Ref: System, Pcp Not In   Subjective:  Heart rate around 100 bpm. Afebrile. Shortness of breath with activity.   Objective:  Vital Signs in the last 24 hours: Temp:  [97.5 F (36.4 C)-98.5 F (36.9 C)] 97.6 F (36.4 C) (04/18 0529) Pulse Rate:  [72-116] 101 (04/18 0611) Resp:  [16-20] 16 (04/18 0553) BP: (103-122)/(63-92) 110/67 (04/18 1027) SpO2:  [79 %-99 %] 95 % (04/18 0611) Weight:  [78.1 kg] 78.1 kg (04/18 0702)  Physical Exam: BP Readings from Last 1 Encounters:  06/08/18 110/67     Wt Readings from Last 1 Encounters:  06/08/18 78.1 kg    Weight change:  Body mass index is 24.71 kg/m. HEENT: [place]/AT, Eyes-Conjunctiva-Pale, Sclera-Non-icteric Neck: No JVD, No bruit, Trachea midline. Lungs:  Clearing, Bilateral. Cardiac:  Regular rhythm, normal S1 and S2, no S3. II/VI systolic murmur. Abdomen:  Soft, non-tender. BS present. Extremities:  No edema present. No cyanosis. No clubbing. CNS: AxOx3, Cranial nerves grossly intact, moves all 4 extremities.  Skin: Warm and dry.   Intake/Output from previous day: 04/17 0701 - 04/18 0700 In: 360 [P.O.:360] Out: 950 [Urine:950]    Lab Results: BMET    Component Value Date/Time   NA 135 06/08/2018 0554   NA 131 (L) 06/07/2018 0637   NA 137 06/06/2018 0343   K 4.8 06/08/2018 0554   K 5.7 (H) 06/07/2018 0637   K 4.8 06/06/2018 0343   CL 100 06/08/2018 0554   CL 100 06/07/2018 0637   CL 102 06/06/2018 0343   CO2 20 (L) 06/08/2018 0554   CO2 17 (L) 06/07/2018 0637   CO2 24 06/06/2018 0343   GLUCOSE 148 (H) 06/08/2018 0554   GLUCOSE 243 (H) 06/07/2018 0637   GLUCOSE 191 (H) 06/06/2018 0343   BUN 85 (H) 06/08/2018 0554   BUN 80 (H) 06/07/2018 0637   BUN 65 (H) 06/06/2018 0343   CREATININE 2.56 (H) 06/08/2018 0554   CREATININE 2.47 (H) 06/07/2018 0637   CREATININE 2.08 (H) 06/06/2018 0343   CALCIUM 9.6 06/08/2018 0554   CALCIUM 9.0 06/07/2018 0637   CALCIUM 9.0 06/06/2018 0343   GFRNONAA 24 (L) 06/08/2018 0554   GFRNONAA 25 (L) 06/07/2018 0637   GFRNONAA 31 (L) 06/06/2018 0343   GFRAA 28 (L) 06/08/2018 0554   GFRAA 29 (L) 06/07/2018 0637   GFRAA 36 (L) 06/06/2018 0343   CBC    Component Value Date/Time   WBC 9.0 06/08/2018 0544   RBC 3.02 (L) 06/08/2018 0544   HGB 8.0 (L) 06/08/2018 0544   HCT 26.3 (L) 06/08/2018 0544   PLT 191 06/08/2018 0544   MCV 87.1 06/08/2018 0544   MCH 26.5 06/08/2018 0544   MCHC 30.4 06/08/2018 0544   RDW 17.2 (H) 06/08/2018 0544   LYMPHSABS 0.4 (L) 06/07/2018 0637   MONOABS 0.4 06/07/2018 0637   EOSABS 0.0 06/07/2018 0637   BASOSABS 0.0 06/07/2018 0637   HEPATIC Function Panel Recent Labs    04/27/18 0124 05/20/18 0554 06/07/18 0637  PROT 5.3* 5.9* 5.9*   HEMOGLOBIN A1C No components found for: HGA1C,  MPG CARDIAC ENZYMES Lab Results  Component Value Date   CKTOTAL 16 (L) 06/08/2018   CKMB 1.9 06/08/2018   TROPONINI 0.27 (HH) 06/08/2018   TROPONINI 0.29 (HH) 06/07/2018   TROPONINI 0.31 (HH) 06/07/2018   BNP No results for input(s): PROBNP in the last 8760 hours. TSH Recent Labs    04/28/18 1052 06/03/18 0012  TSH 0.109* 1.411   CHOLESTEROL No results for input(s):  CHOL in the last 8760 hours.  Scheduled Meds: . allopurinol  100 mg Oral Daily  . alum & mag hydroxide-simeth  30 mL Oral Once  . alum & mag hydroxide-simeth  1 application Topical Once  . amiodarone  200 mg Oral Daily  . aspirin EC  81 mg Oral Daily  . atorvastatin  40 mg Oral q1800  . diltiazem  300 mg Oral Daily  . folic acid  1 mg Oral Daily  . furosemide  40 mg Oral Daily  . heparin injection (subcutaneous)  5,000 Units Subcutaneous Q8H  . hyoscyamine  0.375 mg Oral Q12H  . insulin aspart  0-15 Units Subcutaneous TID WC  . insulin glargine  7 Units Subcutaneous QHS  . ipratropium  0.5 mg Nebulization Q8H  . levalbuterol  1.25 mg Nebulization Q8H  . loratadine  10 mg Oral Daily  . metoprolol tartrate  50 mg Oral BID  . pantoprazole  40 mg Oral BID  . predniSONE   60 mg Oral Q breakfast  . sulfamethoxazole-trimethoprim  1 tablet Oral Q12H  . tamsulosin  0.4 mg Oral QPC supper  . thiamine  100 mg Oral Daily   Continuous Infusions: PRN Meds:.albuterol, clonazepam, HYDROcodone-acetaminophen, methocarbamol, nitroGLYCERIN, sorbitol  Assessment/Plan: Acute on chronic respiratory failure, improving COPD Acute on chronic left systolic heart failure, compensated now NSTEMI Chronic atrial fibrillation Acute renal failure with chronic kidney disease Iron deficiency anemia Crohn's colitis  Continue amiodarone, diltiazem and metoprolol.   LOS: 2 days    Orpah Cobb  MD  06/08/2018, 11:11 AM

## 2018-06-08 NOTE — Progress Notes (Signed)
Occupational Therapy Session Note  Patient Details  Name: TJ KITCHINGS MRN: [id number] Date of Birth: 1945/10/01  Today's Date: 06/08/2018 OT Individual Time: 1100-1200 OT Individual Time Calculation (min): 60 min    Short Term Goals: Week 1:  OT Short Term Goal 1 (Week 1): Pt will tolerate sitting EOB for 5 mins in preparation for BADL task OT Short Term Goal 2 (Week 1): Pt will complete sit<>stand with mod A of 1 OT Short Term Goal 3 (Week 1): Pt will complete 1 step of LB dressing  Skilled Therapeutic Interventions/Progress Updates:   Pt on 4 L of 02 at 98% sat rate.    Pt received in bed and initially very apprehensive about therapy stating that his legs felt like noodles and he did not think he would be able to stand.  After some encouragement, pt sat to EOB with min A. No dizziness. Pt could not recall events of yesterday at all, not even remembering that the therapists tried to work with him.  His speech was slightly slurred and he demonstrated some confusion with conversation, but overall he could follow directions and attend to task.  From EOB, worked on LE AROM exercises. He then needed to toilet.  For safety, NT arrived for +2 A but actually pt was able to stand to RW with min A, turn around to [facility] with min A, and use R hand to self cleanse even though he was not able to void.   Pt returned to EOB and moved back into supine to continue LE AROM exercises. Pt's heart rate and O2 levels with oxygen support remained WNL during session.   Bed alarm set and all needs met.  Therapy Documentation Precautions:  Precautions Precautions: Fall Precaution Comments: watch O2 and HR Restrictions Weight Bearing Restrictions: No  Vital Signs: Therapy Vitals Temp: 97.6 F (36.4 C) Temp Source: Oral Pulse Rate: (!) 101 Resp: 16 BP: 110/67 Patient Position (if appropriate): Lying Oxygen Therapy SpO2: 95 % O2 Device: Nasal Cannula O2 Flow Rate (L/min): 4 L/min Pain: Pain  Assessment Pain Scale: 0-10 Pain Score: 3  Pain Type: Acute pain Pain Location: Chest Pain Orientation: Right Pain Radiating Towards: L chest Pain Descriptors / Indicators: Sore Pain Frequency: Constant Pain Onset: On-going Pain Intervention(s): Other (Comment)(NTG, 3rd dose)   Therapy/Group: Individual Therapy  [place] 06/08/2018, 7:59 AM

## 2018-06-08 NOTE — Progress Notes (Signed)
Pt complained of 4/10 R sided chest pain, sharp/tightness, radiating to R arm while resting in bed, with mild shortness of breath. Vital signs checked. Rapid Response RN called and came to bedside. 12 lead EKG done. NTG SL x 3 given. Chest pain down to 3/10 described as pressure to soreness. Dr. Riley Kill informed with orders. Will monitor.

## 2018-06-09 ENCOUNTER — Inpatient Hospital Stay (HOSPITAL_COMMUNITY): Payer: Medicare Other

## 2018-06-09 LAB — GLUCOSE, CAPILLARY
Glucose-Capillary: 125 mg/dL — ABNORMAL HIGH (ref 70–99)
Glucose-Capillary: 137 mg/dL — ABNORMAL HIGH (ref 70–99)
Glucose-Capillary: 267 mg/dL — ABNORMAL HIGH (ref 70–99)
Glucose-Capillary: 220 mg/dL — ABNORMAL HIGH (ref 70–99)

## 2018-06-09 MED ORDER — IPRATROPIUM BROMIDE 0.02 % IN SOLN
0.5000 mg | Freq: Three times a day (TID) | RESPIRATORY_TRACT | Status: DC
  Administered 2018-06-10 – 2018-06-11 (×5): 0.5 mg via RESPIRATORY_TRACT
  Filled 2018-06-09 (×6): qty 2.5

## 2018-06-09 MED ORDER — LEVALBUTEROL HCL 1.25 MG/0.5ML IN NEBU
1.2500 mg | INHALATION_SOLUTION | Freq: Three times a day (TID) | RESPIRATORY_TRACT | Status: DC
  Administered 2018-06-10 – 2018-06-11 (×5): 1.25 mg via RESPIRATORY_TRACT
  Filled 2018-06-09 (×7): qty 0.5

## 2018-06-09 MED ORDER — BISACODYL 10 MG RE SUPP
10.0000 mg | Freq: Every day | RECTAL | Status: DC | PRN
  Administered 2018-06-10: 06:00:00 10 mg via RECTAL
  Filled 2018-06-09: qty 1

## 2018-06-09 MED ORDER — SORBITOL 70 % SOLN: 60.0000 mL | Status: AC

## 2018-06-09 MED ORDER — INSULIN GLARGINE 100 UNIT/ML ~~LOC~~ SOLN
5.0000 [IU] | Freq: Every day | SUBCUTANEOUS | Status: DC
  Administered 2018-06-09 – 2018-06-17 (×9): 5 [IU] via SUBCUTANEOUS
  Filled 2018-06-09 (×10): qty 0.05

## 2018-06-09 NOTE — Progress Notes (Signed)
Occupational Therapy Session Note  Patient Details  Name: Richard Wang MRN: [id number] Date of Birth: 12-05-1945  Today's Date: 06/09/2018 OT Individual Time: 3903-0092 Session 2: 3300-7622 OT Individual Time Calculation (min): 75 min Session 2: 45 min   Short Term Goals: Week 1:  OT Short Term Goal 1 (Week 1): Pt will tolerate sitting EOB for 5 mins in preparation for BADL task OT Short Term Goal 2 (Week 1): Pt will complete sit<>stand with mod A of 1 OT Short Term Goal 3 (Week 1): Pt will complete 1 step of LB dressing  Skilled Therapeutic Interventions/Progress Updates:   Session 1:   Pt received supine in bed with RN present administering medication. Pain as described below. Pt quite verbose, requiring cueing to redirect to task. Pt on 4L [place] throughout session. Pt donned pants, threading while supine, and pulling up in standing. Pt able to complete sit > stand with min A using RW for anterior support. Cueing for UE placement. Pt able to thread belt through pants while seated, requiring min A to fasten in standing. Pt able to don shirt with set up assist. All VSS during EOB activity. Teds were donned EOB with mod A, pt able to pull up once over feet. Min A to don L shoe with AFO . Edu provided re distal reaching and work of breathing. Demo provided re propping feet up to decreased forward flexion. Pt provided several rest breaks during session d/t SOB, but no significant change in vitals. Pt completed ~5 ft functional mobility in room using RW to w/c with min A. Pt was transported to therapy gym with total A for time management. Pt required almost mod A to initiate sit > stand from w/c d/t lower surface, then completed ~ 10 ft of functional mobility with CGA to mat. Cueing provided for hand placement. Min A to stand from EOM. Good carryover of UE placement now. Pt completed functional reciprocal stepping bilaterally, standing for ~ 1 min 20 seconds before requiring seated rest break. Pt returned  to room.  Pt pleasant and motivated throughout session, gray coloring in the face. No c/o chest pain throughout.   Session 2: Pt received supine in bed with c/o fatigue, initially declining session. With further conversation, pt disclosed feeling fatigued and confused re events of hospitalization causing anxiety. Emotional support was provided as pt spoke. Demonstration/education provided re mindfulness strategies to use with anxiety, including deep breathing. Pt agreed to a BUE strengthening circuit at bed level. Pt held 4 lb dumbbells for most exercises, and some were graded to using only 1 dumbbell or body weight only. Pt followed demonstration but required frequent re-direction to task d/t attention deficit. Pt was left supine with all needs met, bed alarm set. Pt's HR fluctuated between 90-115 bpm during session, SpO2 > 95% on 4 L [place]. No c/o pain.   Therapy Documentation Precautions:  Precautions Precautions: Fall Precaution Comments: watch O2 and HR Restrictions Weight Bearing Restrictions: No Vital Signs: Therapy Vitals Temp: 98 F (36.7 C) Temp Source: Oral Pulse Rate: 71 Resp: 18 BP: 114/78 Patient Position (if appropriate): Lying Oxygen Therapy SpO2: 98 % O2 Device: Nasal Cannula O2 Flow Rate (L/min): 4 L/min Pain: Pain Assessment Pain Scale: 0-10 Pain Score: 6  Pain Type: Chronic pain Pain Location: Back Pain Orientation: Lower Pain Descriptors / Indicators: Aching Pain Frequency: Intermittent Pain Onset: On-going Pain Intervention(s): Medication (See eMAR)   Therapy/Group: Individual Therapy  Curtis Sites 06/09/2018, 7:17 AM

## 2018-06-09 NOTE — Progress Notes (Signed)
Patient c/o of SOB anxiety med given; 02 sat 98% . Reassured patient.

## 2018-06-09 NOTE — Progress Notes (Signed)
Physical Therapy Session Note  Patient Details  Name: Richard Wang MRN: [id number] Date of Birth: September 28, 1945  Today's Date: 06/09/2018 PT Individual Time: 1010-1100 PT Individual Time Calculation (min): 50 min   Short Term Goals: Week 1:  PT Short Term Goal 1 (Week 1): Pt will tolerate 30 min of upright sitting w/o increase in fatigue PT Short Term Goal 2 (Week 1): Pt will ambulate 50' w/ LRAD w/ min assist PT Short Term Goal 3 (Week 1): Pt will transfer bed<>chair w/ min assist  Skilled Therapeutic Interventions/Progress Updates:    Patient c/o fatigued after dressing earlier this am.  Multiple complaints as noted below.  Encouraged pt to think of positive gains no matter how small and he reports he was able to dress himself better today than before.  Patient supine to sit with S with elevated HOB.  Patient on 4L O2 [place] with SpO2 93%, R 71.  Sit to stand min a struggling from EOB.  Gait x 100' with RW increased time, slow pace and grunting at times with increased WOB.  SpO2 94%, HR 81.  Patient reports unable to sit or stand for long so performed sit <> stand x 3 with cues for hand placement and improved tolerance, decreased energy expenditure with both hands pushing from bed and close S.  Patient sit to supine with S.  Performed bed exercises as noted below.  Patient c/o SOB in bed so assisted to recliner min A with RW.  Left with call bell, continuous pulse ox and NT in room.   Therapy Documentation Precautions:  Precautions Precautions: Fall Precaution Comments: watch O2 and HR Restrictions Weight Bearing Restrictions: No Pain: Pain Assessment Pain Score: 2  Faces Pain Scale: Hurts little more Pain Type: Chronic pain Pain Location: Back Pain Descriptors / Indicators: Aching Pain Onset: On-going Pain Intervention(s): Repositioned 2nd Pain Site Wong-Baker Pain Rating: Hurts a little bit Pain Type: Chronic pain Pain Location: Abdomen Pain Descriptors / Indicators: Aching Pain  Onset: On-going Pain Intervention(s): Ambulation/increased activity Exercises: General Exercises - Upper Extremity Shoulder Horizontal ABduction: Strengthening;Both;10 reps;Supine;Theraband Theraband Level (Shoulder Horizontal Abduction): Level 2 (Red) General Exercises - Lower Extremity Short Arc Quad: Strengthening;10 reps;Supine Heel Slides: Strengthening;5 reps;Supine Hip ABduction/ADduction: Strengthening;10 reps;Both(hooklying, orange theraband) Straight Leg Raises: 5 reps;Supine;Both;Strengthening Repetitive Sit to Stands: Other (comment);Two upper extremities(3 reps) Repetitive Sit to Stands Level of Assist: Other (comment)(supervision to min ) Repetitive Sit to Stands Surface Height: approx 20" Total Joint Exercises Bridges: Strengthening;Both;5 reps;Supine    Therapy/Group: Individual Therapy  Elray Mcgregor  [place], PT 06/09/2018, 11:06 AM

## 2018-06-09 NOTE — Progress Notes (Signed)
[place] PHYSICAL MEDICINE & REHABILITATION PROGRESS NOTE  Subjective/Complaints: Pt states he had a good night. "I feel better this morning. Woke up good". Intermittent sob reported by RN  ROS: Patient denies fever, rash, sore throat, blurred vision, nausea, vomiting, diarrhea, cough,    headache, or mood change.    Objective: Vital Signs: Blood pressure 114/78, pulse 71, temperature 98 F (36.7 C), temperature source Oral, resp. rate 18, weight 76.4 kg, SpO2 96 %. No results found. Recent Labs    06/07/18 0637 06/08/18 0544  WBC 12.4* 9.0  HGB 8.4* 8.0*  HCT 27.4* 26.3*  PLT 231 191   Recent Labs    06/07/18 0637 06/08/18 0554  NA 131* 135  K 5.7* 4.8  CL 100 100  CO2 17* 20*  GLUCOSE 243* 148*  BUN 80* 85*  CREATININE 2.47* 2.56*  CALCIUM 9.0 9.6    Physical Exam: BP 114/78 (BP Location: Left Arm)   Pulse 71   Temp 98 F (36.7 C) (Oral)   Resp 18   Wt 76.4 kg   SpO2 96%   BMI 24.17 kg/m  Constitutional: No distress . Vital signs reviewed. HEENT: EOMI, oral membranes moist Neck: supple Cardiovascular: RRR without murmur. No JVD    Respiratory: CTA Bilaterally without wheezes or rales. Normal effort    GI: BS +, non-tender, non-distended   Musc: No edema or tenderness in extremities. Neurological: Patient is alert  Follows commands.  Skin: Color is chronically grayin face Motor: Bilateral upper extremities: 4+/5 proximal distal Right lower extremity: Hip flexion, knee extension 2+/5, ankle dorsiflexion 4 medicine/5 Left lower extremity: Hip flexion, knee extension 2+/5, ankle dorsiflexion 1/5  Psych: calm and in better spirits today  Assessment/Plan: 1. Functional deficits secondary to chronic respiratory failure with multiple medical complications, including cardiac and rheumatological which require 3+ hours per day of interdisciplinary therapy in a comprehensive inpatient rehab setting.  Physiatrist is providing close team supervision and 24  hour management of active medical problems listed below.  Physiatrist and rehab team continue to assess barriers to discharge/monitor patient progress toward functional and medical goals  Care Tool:  Bathing    Body parts bathed by patient: Right arm, Left arm, Chest, Abdomen, Front perineal area, Right upper leg, Left upper leg, Face, Right lower leg, Left lower leg   Body parts bathed by helper: Buttocks     Bathing assist Assist Level: Minimal Assistance - Patient > 75%     Upper Body Dressing/Undressing Upper body dressing Upper body dressing/undressing activity did not occur (including orthotics): Refused What is the patient wearing?: Hospital gown only    Upper body assist Assist Level: Minimal Assistance - Patient > 75%    Lower Body Dressing/Undressing Lower body dressing    Lower body dressing activity did not occur: Refused What is the patient wearing?: Incontinence brief     Lower body assist Assist for lower body dressing: Minimal Assistance - Patient > 75%     Toileting Toileting    Toileting assist Assist for toileting: Moderate Assistance - Patient 50 - 74%     Transfers Chair/bed transfer  Transfers assist  Chair/bed transfer activity did not occur: Refused  Chair/bed transfer assist level: Minimal Assistance - Patient > 75%     Locomotion Ambulation   Ambulation assist   Ambulation activity did not occur: Safety/medical concerns          Walk 10 feet activity   Assist  Walk 10 feet activity did not occur: Safety/medical  concerns        Walk 50 feet activity   Assist Walk 50 feet with 2 turns activity did not occur: Safety/medical concerns         Walk 150 feet activity   Assist Walk 150 feet activity did not occur: Safety/medical concerns         Walk 10 feet on uneven surface  activity   Assist Walk 10 feet on uneven surfaces activity did not occur: Safety/medical concerns          Wheelchair     Assist     Wheelchair activity did not occur: Safety/medical concerns         Wheelchair 50 feet with 2 turns activity    Assist    Wheelchair 50 feet with 2 turns activity did not occur: Safety/medical concerns       Wheelchair 150 feet activity     Assist Wheelchair 150 feet activity did not occur: Safety/medical concerns          Medical Problem List and Plan: 1.Debilitysecondary to acute on chronic respiratory failurewith hypoxemia and hypercarbia, CAD/CHF with history of colitis on Remicade.  --Continue CIR therapies including PT, OT  2. Antithrombotics: -DVT/anticoagulation:Subcutaneous heparin -antiplatelet therapy: Aspirin 81 mg daily 3. Pain Management/Chronic back pain:Hydrocodone and Robaxin as needed  Needs regular encouragement  -kpad for chest wall, prn ice  -added levbid for GI cramping 4. Mood:Provide emotional support  Klonopin 3 times daily started on 4/17 -antipsychotic agents: N/A 5. Neuropsych: This patientiscapable of making decisions on hisown behalf. 6. Skin/Wound Care:Routine skin checks 7. Fluids/Electrolytes/Nutrition:Routine in and outs 8.CAD with non-STEMI. Continue aspirin. Follow-up per cardiology services  Anxiety remains a contributing factor to chest pain, however objective findings present as well.   EKG 4/18 with afib, nonspecific ST, T wave abnl.    -troponin actually trending DOWN  -cards following along, spoke with cardiology yesterday  -continue to monitor closely  -treat musculoskeletal pain and anxiety 9.Atrial fibrillation. Diltiazem daily/amiodarone daily/hydralazine/metoprolol.   Medications being adjusted by Cards, appreciate recs  Heart rate controlled 4/19 10.Hypertension.  See #9  Monitor with increased mobility 11. Diastolic congestive heart failure. Monitor for any signs of fluid overload.Continue Lasix as directed  Daily weights  ordered. ?reliable Filed Weights   06/08/18 0702 06/09/18 0435  Weight: 78.1 kg 76.4 kg   12. Urinary retention/Klebsiella pneumo UTI. Urecholine 10 mg 3 times a day, Flomax 0.4 mg daily. complete course of Bactrim through 4/21 13. Hyperlipidemia. Lipitor 14.  Steroid-induced hyperglycemia with hx of DM. Latest hemoglobin A1c 5.0.   Patient on metformin 1000 mg twice a day prior to admissionbut held due to mildly elevated creatinine.   Presently on Lantus insulin 7 units daily at bedtime.   Check blood sugars before meals and at bedtime. Diabetic teaching  Monitor with taper of prednisone.    Some improvement but still poorly controlled   -add AM lantus dose for now 15. Crohn's colitis with history of GI bleed. Protonix 40 mg twice a day.Patientdid receive Remicade 06/05/2018 and is scheduled every 8 weeks  Need to be particularly cautious due to immunosuppression  -continue levbid  for GI spasms  -needs BM--sorbitol today, suppository prn 16. AKI/renal insufficiency. Strict in and out's.   Creatinine 2.56 4/18  Encourage fluids 16. Tobacco abuse. Counseling 17. Acute on chronic anemia.   Hemoglobin 8.0 today, has been trending down for the last week  -no gross blood  Labs ordered for Monday 18. Hyponatremia  Sodium 135  Labs ordered for Monday 19.  Hyperkalemia  Potassium 4.8  Labs ordered for Monday 20.  Transaminitis  ALT elevated on 4/17  Labs ordered for Monday 21.  Leukocytosis  WBCs 9.0, steroid driven, afebrile  Afebrile    35 min time spent   LOS: 3 days A FACE TO FACE EVALUATION WAS PERFORMED  Ranelle Oyster 06/09/2018, 9:15 AM

## 2018-06-09 NOTE — Consult Note (Signed)
Ref: System, Pcp Not In   Subjective:  Feels tired easily. Heart rate 70-90 at rest and 90-110 bpm with mild activity.   Objective:  Vital Signs in the last 24 hours: Temp:  [97.6 F (36.4 C)-98.2 F (36.8 C)] 98 F (36.7 C) (04/19 0435) Pulse Rate:  [71-121] 71 (04/19 0435) Resp:  [18] 18 (04/19 0435) BP: (109-124)/(71-83) 114/78 (04/19 0435) SpO2:  [95 %-98 %] 96 % (04/19 0726) Weight:  [76.4 kg] 76.4 kg (04/19 0435)  Physical Exam: BP Readings from Last 1 Encounters:  06/09/18 114/78     Wt Readings from Last 1 Encounters:  06/09/18 76.4 kg    Weight change:  Body mass index is 24.17 kg/m. HEENT: [place]/AT, Eyes-Conjunctiva-Pale, Sclera-Non-icteric Neck: No JVD, No bruit, Trachea midline. Lungs:  Clearing, Bilateral. Cardiac:  Irregular rhythm, normal S1 and S2, no S3. II/VI systolic murmur. Abdomen:  Soft, non-tender. BS present. Extremities:  No edema present. No cyanosis. No clubbing. CNS: AxOx3, Cranial nerves grossly intact, moves all 4 extremities.  Skin: Warm and dry.   Intake/Output from previous day: 04/18 0701 - 04/19 0700 In: 480 [P.O.:480] Out: 1630 [Urine:1630]    Lab Results: BMET    Component Value Date/Time   NA 135 06/08/2018 0554   NA 131 (L) 06/07/2018 0637   NA 137 06/06/2018 0343   K 4.8 06/08/2018 0554   K 5.7 (H) 06/07/2018 0637   K 4.8 06/06/2018 0343   CL 100 06/08/2018 0554   CL 100 06/07/2018 0637   CL 102 06/06/2018 0343   CO2 20 (L) 06/08/2018 0554   CO2 17 (L) 06/07/2018 0637   CO2 24 06/06/2018 0343   GLUCOSE 148 (H) 06/08/2018 0554   GLUCOSE 243 (H) 06/07/2018 0637   GLUCOSE 191 (H) 06/06/2018 0343   BUN 85 (H) 06/08/2018 0554   BUN 80 (H) 06/07/2018 0637   BUN 65 (H) 06/06/2018 0343   CREATININE 2.56 (H) 06/08/2018 0554   CREATININE 2.47 (H) 06/07/2018 0637   CREATININE 2.08 (H) 06/06/2018 0343   CALCIUM 9.6 06/08/2018 0554   CALCIUM 9.0 06/07/2018 0637   CALCIUM 9.0 06/06/2018 0343   GFRNONAA 24 (L) 06/08/2018  0554   GFRNONAA 25 (L) 06/07/2018 0637   GFRNONAA 31 (L) 06/06/2018 0343   GFRAA 28 (L) 06/08/2018 0554   GFRAA 29 (L) 06/07/2018 0637   GFRAA 36 (L) 06/06/2018 0343   CBC    Component Value Date/Time   WBC 9.0 06/08/2018 0544   RBC 3.02 (L) 06/08/2018 0544   HGB 8.0 (L) 06/08/2018 0544   HCT 26.3 (L) 06/08/2018 0544   PLT 191 06/08/2018 0544   MCV 87.1 06/08/2018 0544   MCH 26.5 06/08/2018 0544   MCHC 30.4 06/08/2018 0544   RDW 17.2 (H) 06/08/2018 0544   LYMPHSABS 0.4 (L) 06/07/2018 0637   MONOABS 0.4 06/07/2018 0637   EOSABS 0.0 06/07/2018 0637   BASOSABS 0.0 06/07/2018 0637   HEPATIC Function Panel Recent Labs    04/27/18 0124 05/20/18 0554 06/07/18 0637  PROT 5.3* 5.9* 5.9*   HEMOGLOBIN A1C No components found for: HGA1C,  MPG CARDIAC ENZYMES Lab Results  Component Value Date   CKTOTAL 16 (L) 06/08/2018   CKMB 1.9 06/08/2018   TROPONINI 0.27 (HH) 06/08/2018   TROPONINI 0.29 (HH) 06/07/2018   TROPONINI 0.31 (HH) 06/07/2018   BNP No results for input(s): PROBNP in the last 8760 hours. TSH Recent Labs    04/28/18 1052 06/03/18 0012  TSH 0.109* 1.411   CHOLESTEROL  No results for input(s): CHOL in the last 8760 hours.  Scheduled Meds: . allopurinol  100 mg Oral Daily  . alum & mag hydroxide-simeth  30 mL Oral Once  . alum & mag hydroxide-simeth  1 application Topical Once  . amiodarone  200 mg Oral Daily  . aspirin EC  81 mg Oral Daily  . atorvastatin  40 mg Oral q1800  . diltiazem  300 mg Oral Daily  . folic acid  1 mg Oral Daily  . furosemide  40 mg Oral Daily  . heparin injection (subcutaneous)  5,000 Units Subcutaneous Q8H  . hyoscyamine  0.375 mg Oral Q12H  . insulin aspart  0-15 Units Subcutaneous TID WC  . insulin glargine  5 Units Subcutaneous Daily  . insulin glargine  7 Units Subcutaneous QHS  . ipratropium  0.5 mg Nebulization Q8H  . levalbuterol  1.25 mg Nebulization Q8H  . loratadine  10 mg Oral Daily  . metoprolol tartrate  50 mg  Oral BID  . pantoprazole  40 mg Oral BID  . predniSONE  60 mg Oral Q breakfast  . sorbitol  60 mL Oral NOW  . sulfamethoxazole-trimethoprim  1 tablet Oral Q12H  . tamsulosin  0.4 mg Oral QPC supper  . thiamine  100 mg Oral Daily   Continuous Infusions: PRN Meds:.albuterol, bisacodyl, clonazepam, HYDROcodone-acetaminophen, methocarbamol, nitroGLYCERIN, sorbitol  Assessment/Plan: Acute on chronic respiratory failure COPD Acute on chronic left systolic heart failure NSTEMI Chronic atrial fibrillation ute renal failure with chronic kidney disease Iron deficiency anemia Crohn"s colitis  Continue medications.  May use bedside monitor May mix activity with periods of sleep as needed.   LOS: 3 days    Orpah Cobb  MD  06/09/2018, 2:23 PM

## 2018-06-09 NOTE — Plan of Care (Signed)
  Problem: RH BOWEL ELIMINATION Goal: RH STG MANAGE BOWEL WITH ASSISTANCE Description STG Manage Bowel with Assistance.mod assistance  Outcome: Not Progressing; LBM 4/18 but small ; order for sorbitol pt refused additional 30cc   Problem: RH BOWEL ELIMINATION Goal: RH STG MANAGE BOWEL W/MEDICATION W/ASSISTANCE Description STG Manage Bowel with Medication with mod  Assistance.  Outcome: Not Progressing   Problem: RH PAIN MANAGEMENT Goal: RH STG PAIN MANAGED AT OR BELOW PT'S PAIN GOAL Description Pain less than 4  Outcome: Not Progressing; c/o constant pain

## 2018-06-10 ENCOUNTER — Inpatient Hospital Stay (HOSPITAL_COMMUNITY): Payer: Medicare Other | Admitting: Occupational Therapy

## 2018-06-10 ENCOUNTER — Inpatient Hospital Stay (HOSPITAL_COMMUNITY): Payer: Medicare Other | Admitting: Physical Therapy

## 2018-06-10 DIAGNOSIS — J9610 Chronic respiratory failure, unspecified whether with hypoxia or hypercapnia: Secondary | ICD-10-CM

## 2018-06-10 DIAGNOSIS — B961 Klebsiella pneumoniae [K. pneumoniae] as the cause of diseases classified elsewhere: Secondary | ICD-10-CM

## 2018-06-10 DIAGNOSIS — B9689 Other specified bacterial agents as the cause of diseases classified elsewhere: Secondary | ICD-10-CM

## 2018-06-10 DIAGNOSIS — N390 Urinary tract infection, site not specified: Secondary | ICD-10-CM

## 2018-06-10 LAB — CBC WITH DIFFERENTIAL/PLATELET
Abs Immature Granulocytes: 0.06 10*3/uL (ref 0.00–0.07)
Basophils Absolute: 0 10*3/uL (ref 0.0–0.1)
Basophils Relative: 0 %
Eosinophils Absolute: 0 10*3/uL (ref 0.0–0.5)
Eosinophils Relative: 0 %
HCT: 26.5 % — ABNORMAL LOW (ref 39.0–52.0)
Hemoglobin: 8.1 g/dL — ABNORMAL LOW (ref 13.0–17.0)
Immature Granulocytes: 1 %
Lymphocytes Relative: 7 %
Lymphs Abs: 0.7 10*3/uL (ref 0.7–4.0)
MCH: 26.2 pg (ref 26.0–34.0)
MCHC: 30.6 g/dL (ref 30.0–36.0)
MCV: 85.8 fL (ref 80.0–100.0)
Monocytes Absolute: 0.6 10*3/uL (ref 0.1–1.0)
Monocytes Relative: 6 %
Neutro Abs: 8.3 10*3/uL — ABNORMAL HIGH (ref 1.7–7.7)
Neutrophils Relative %: 86 %
Platelets: 186 10*3/uL (ref 150–400)
RBC: 3.09 MIL/uL — ABNORMAL LOW (ref 4.22–5.81)
RDW: 17 % — ABNORMAL HIGH (ref 11.5–15.5)
WBC: 9.6 10*3/uL (ref 4.0–10.5)
nRBC: 0.2 % (ref 0.0–0.2)

## 2018-06-10 LAB — COMPREHENSIVE METABOLIC PANEL
ALT: 42 U/L (ref 0–44)
AST: 15 U/L (ref 15–41)
Albumin: 3.3 g/dL — ABNORMAL LOW (ref 3.5–5.0)
Alkaline Phosphatase: 50 U/L (ref 38–126)
Anion gap: 13 (ref 5–15)
BUN: 65 mg/dL — ABNORMAL HIGH (ref 8–23)
CO2: 22 mmol/L (ref 22–32)
Calcium: 9.2 mg/dL (ref 8.9–10.3)
Chloride: 102 mmol/L (ref 98–111)
Creatinine, Ser: 2.17 mg/dL — ABNORMAL HIGH (ref 0.61–1.24)
GFR calc Af Amer: 34 mL/min — ABNORMAL LOW (ref >60)
GFR calc non Af Amer: 30 mL/min — ABNORMAL LOW (ref >60)
Glucose, Bld: 193 mg/dL — ABNORMAL HIGH (ref 70–99)
Potassium: 3.9 mmol/L (ref 3.5–5.1)
Sodium: 137 mmol/L (ref 135–145)
Total Bilirubin: 0.8 mg/dL (ref 0.3–1.2)
Total Protein: 5.6 g/dL — ABNORMAL LOW (ref 6.5–8.1)

## 2018-06-10 LAB — GLUCOSE, CAPILLARY
Glucose-Capillary: 236 mg/dL — ABNORMAL HIGH (ref 70–99)
Glucose-Capillary: 133 mg/dL — ABNORMAL HIGH (ref 70–99)
Glucose-Capillary: 248 mg/dL — ABNORMAL HIGH (ref 70–99)
Glucose-Capillary: 257 mg/dL — ABNORMAL HIGH (ref 70–99)

## 2018-06-10 MED ORDER — DM-GUAIFENESIN ER 30-600 MG PO TB12
1.0000 | ORAL_TABLET | Freq: Two times a day (BID) | ORAL | Status: DC
  Administered 2018-06-10 – 2018-06-17 (×14): 1 via ORAL
  Filled 2018-06-10 (×14): qty 1

## 2018-06-10 MED ORDER — CLONAZEPAM 0.25 MG PO TBDP
0.5000 mg | ORAL_TABLET | Freq: Three times a day (TID) | ORAL | Status: DC | PRN
  Administered 2018-06-10 – 2018-06-17 (×11): 0.5 mg via ORAL
  Filled 2018-06-10 (×11): qty 2

## 2018-06-10 MED ORDER — PREDNISONE 20 MG PO TABS
50.0000 mg | ORAL_TABLET | Freq: Every day | ORAL | Status: DC
  Administered 2018-06-11 – 2018-06-13 (×3): 50 mg via ORAL
  Filled 2018-06-10 (×3): qty 2

## 2018-06-10 MED ORDER — POLYETHYLENE GLYCOL 3350 17 G PO PACK
17.0000 g | PACK | Freq: Every day | ORAL | Status: DC
  Administered 2018-06-10 – 2018-06-17 (×6): 17 g via ORAL
  Filled 2018-06-10 (×6): qty 1

## 2018-06-10 NOTE — Progress Notes (Signed)
[place] PHYSICAL MEDICINE & REHABILITATION PROGRESS NOTE  Subjective/Complaints: Patient seen sitting up in bed this morning.  He states he slept well overnight.  He states he had a fair weekend.  He is calmer this morning.  He states he needs to have a bowel movement.  Discussed with covering physician regarding chest pain over the weekend.  ROS: Denies CP, SOB, N/V/D  Objective: Vital Signs: Blood pressure 124/75, pulse 98, temperature (!) 97.4 F (36.3 C), temperature source Oral, resp. rate 14, weight 76.2 kg, SpO2 98 %. No results found. Recent Labs    06/08/18 0544  WBC 9.0  HGB 8.0*  HCT 26.3*  PLT 191   Recent Labs    06/08/18 0554  NA 135  K 4.8  CL 100  CO2 20*  GLUCOSE 148*  BUN 85*  CREATININE 2.56*  CALCIUM 9.6    Physical Exam: BP 124/75 (BP Location: Right Arm)   Pulse 98   Temp (!) 97.4 F (36.3 C) (Oral)   Resp 14   Wt 76.2 kg   SpO2 98%   BMI 24.10 kg/m  Constitutional: No distress . Vital signs reviewed. HENT: Normocephalic.  Atraumatic. Eyes: EOMI. No discharge. Cardiovascular: No JVD. Respiratory: Normal effort. GI: Non-distended. Musc: No edema or tenderness in extremities. Neurological: Patient is alert  Follows commands.  Motor: Bilateral upper extremities: 4+/5 proximal distal Right lower extremity: Hip flexion, knee extension 3-/5, ankle dorsiflexion 4-/5 Left lower extremity: Hip flexion, knee extension 3-/5, ankle dorsiflexion 1/5  Skin: Color is chronically grayin face Psych: Flat  Assessment/Plan: 1. Functional deficits secondary to chronic respiratory failure with multiple medical complications, including cardiac and rheumatological which require 3+ hours per day of interdisciplinary therapy in a comprehensive inpatient rehab setting.  Physiatrist is providing close team supervision and 24 hour management of active medical problems listed below.  Physiatrist and rehab team continue to assess barriers to  discharge/monitor patient progress toward functional and medical goals  Care Tool:  Bathing    Body parts bathed by patient: Right arm, Left arm, Chest, Abdomen, Front perineal area, Right upper leg, Left upper leg, Face, Right lower leg, Left lower leg   Body parts bathed by helper: Buttocks     Bathing assist Assist Level: Minimal Assistance - Patient > 75%     Upper Body Dressing/Undressing Upper body dressing Upper body dressing/undressing activity did not occur (including orthotics): Refused What is the patient wearing?: Hospital gown only    Upper body assist Assist Level: Minimal Assistance - Patient > 75%    Lower Body Dressing/Undressing Lower body dressing    Lower body dressing activity did not occur: Refused What is the patient wearing?: Incontinence brief     Lower body assist Assist for lower body dressing: Minimal Assistance - Patient > 75%     Toileting Toileting    Toileting assist Assist for toileting: Moderate Assistance - Patient 50 - 74%     Transfers Chair/bed transfer  Transfers assist  Chair/bed transfer activity did not occur: Refused  Chair/bed transfer assist level: Minimal Assistance - Patient > 75%     Locomotion Ambulation   Ambulation assist   Ambulation activity did not occur: Safety/medical concerns  Assist level: Contact Guard/Touching assist Assistive device: Walker-rolling Max distance: 100'   Walk 10 feet activity   Assist  Walk 10 feet activity did not occur: Safety/medical concerns  Assist level: Contact Guard/Touching assist Assistive device: Walker-rolling   Walk 50 feet activity   Assist Walk 50  feet with 2 turns activity did not occur: Safety/medical concerns  Assist level: Contact Guard/Touching assist Assistive device: Walker-rolling    Walk 150 feet activity   Assist Walk 150 feet activity did not occur: Safety/medical concerns         Walk 10 feet on uneven surface  activity   Assist  Walk 10 feet on uneven surfaces activity did not occur: Safety/medical concerns         Wheelchair     Assist     Wheelchair activity did not occur: Safety/medical concerns         Wheelchair 50 feet with 2 turns activity    Assist    Wheelchair 50 feet with 2 turns activity did not occur: Safety/medical concerns       Wheelchair 150 feet activity     Assist Wheelchair 150 feet activity did not occur: Safety/medical concerns          Medical Problem List and Plan: 1.Debilitysecondary to acute on chronic respiratory failurewith hypoxemia and hypercarbia, CAD/CHF with history of colitis on Remicade.  Continue CIR   Weekend notes reviewed, discussed with covering physician regarding anxiety and chest pain  Prednisone decreased to 50 on 4/20 2. Antithrombotics: -DVT/anticoagulation:Subcutaneous heparin -antiplatelet therapy: Aspirin 81 mg daily 3. Pain Management/Chronic back pain:Hydrocodone and Robaxin as needed  Needs regular encouragement  -kpad for chest wall, prn ice  -added levbid for GI cramping, will consider DC in future 4. Mood:Provide emotional support  Klonopin 3 times daily started on 4/17, increased on 4/20 -antipsychotic agents: N/A 5. Neuropsych: This patientiscapable of making decisions on hisown behalf. 6. Skin/Wound Care:Routine skin checks 7. Fluids/Electrolytes/Nutrition:Routine in and outs 8.CAD with non-STEMI. Continue aspirin. Follow-up per cardiology services  Anxiety remains a contributing factor to chest pain, however objective findings present as well.   Repeat ECG on 4/18 reviewed, showing A. fib and prolonged QTC  Continue with cards recs 9.Atrial fibrillation. Diltiazem daily/amiodarone daily/hydralazine/metoprolol.   Medications being adjusted by Cards, appreciate recs  Heart rate relatively controlled on 4/20 10.Hypertension.  See #9  Relatively controlled on 4/20  Monitor  with increased mobility 11. Diastolic congestive heart failure. Monitor for any signs of fluid overload.Continue Lasix as directed  Daily weights ordered.  Filed Weights   06/08/18 0702 06/09/18 0435 06/10/18 0406  Weight: 78.1 kg 76.4 kg 76.2 kg   Stable on 4/20 12. Urinary retention/Klebsiella pneumo UTI. Urecholine 10 mg 3 times a day, Flomax 0.4 mg daily. complete course of Bactrim through 4/21, will need to monitor with current creatinine clearance 13. Hyperlipidemia. Lipitor 14.  Steroid-induced hyperglycemia with hx of DM. Latest hemoglobin A1c 5.0.   Patient on metformin 1000 mg twice a day prior to admissionbut held due to mildly elevated creatinine.   Presently on Lantus insulin 7 units daily at bedtime, with 5 mg daily.   Check blood sugars before meals and at bedtime. Diabetic teaching  Monitor with taper of prednisone.    Elevated on 4/20 15. Crohn's colitis with history of GI bleed. Protonix 40 mg twice a day.Patientdid receive Remicade 06/05/2018 and is scheduled every 8 weeks  Need to be particularly cautious due to immunosuppression  continue levbid  for GI spasms  MiraLAX daily ordered on 4/20 16. AKI/renal insufficiency. Strict in and out's.   Creatinine 2.56 on 4/18  Labs pending for today  Will consider IVF  Encourage fluids 16. Tobacco abuse. Counseling 17. Acute on chronic anemia.   Hemoglobin 8.0 on 4/18  Labs pending for  today 18. Hyponatremia  Sodium 135 on 4/18   Labs pending 19.  Hyperkalemia  Potassium 4.8 on 4/18  Labs pending 20.  Transaminitis  ALT elevated on 4/17  Labs pending 21.  Leukocytosis: Resolved  WBCs 9.0 on 4/18  Afebrile    LOS: 4 days A FACE TO FACE EVALUATION WAS PERFORMED  Ankit Karis JubaAnil Patel 06/10/2018, 9:05 AM

## 2018-06-10 NOTE — Progress Notes (Signed)
Occupational Therapy Note  Patient Details  Name: Richard Wang MRN: [id number] Date of Birth: Apr 17, 1945  Today's Date: 06/10/2018 OT Missed Time: 30 Minutes Missed Time Reason: Patient fatigue  Pt missed 30 mins scheduled OT treatment session secondary to fatigue/refusal.  Pt received in sidelying facing away from doorway.  Pt reporting fatigue and not opening eyes to therapist despite encouragement to participate.  Pt stating someone was coming to draw blood, however they had already come and gone.  Pt continuing to refuse.  RN aware.  Rosalio Loud 06/10/2018, 11:53 AM

## 2018-06-10 NOTE — Progress Notes (Signed)
Physical Therapy Session Note  Patient Details  Name: Richard Wang MRN: [id number] Date of Birth: [birth year]  Today's Date: 06/10/2018 PT Individual Time: 0830-0850 PT Individual Time Calculation (min): 20 min   Short Term Goals: Week 1:  PT Short Term Goal 1 (Week 1): Pt will tolerate 30 min of upright sitting w/o increase in fatigue PT Short Term Goal 2 (Week 1): Pt will ambulate 50' w/ LRAD w/ min assist PT Short Term Goal 3 (Week 1): Pt will transfer bed<>chair w/ min assist  Skilled Therapeutic Interventions/Progress Updates:   Pt toileting on BSC upon arrival, states he is still constipated and trying to have a BM. States he would like to finish before attempting therapy. Pt appears much better today, no SOB  and sitting up and OOB. Despite this, pt states he doesn't feel any better. Returned to assist back off toilet, min assist to stand while RN performed pericare. Needed to return to sitting on BSC prior to standing again 2/2 fatigue. Pt states he can't participate this morning 2/2 fatigue and he continues to state that he feels no better than last week. Educated pt on importance of pushing through fatigue, especially when he is not SOB however pt continued to decline. Stand pivot transfer back to EOB w/ min assist and increased time 2/2 RW management. Scooting up in bed and bed mobility w/ supervision. Ended session in supine, all needs in reach. Missed another 10 min 2/2 fatigue/refusal.   Therapy Documentation Precautions:  Precautions Precautions: Fall Precaution Comments: watch O2 and HR Restrictions Weight Bearing Restrictions: No Vital Signs: Therapy Vitals Pulse Rate: 98 Oxygen Therapy SpO2: 96 % O2 Device: Nasal Cannula O2 Flow Rate (L/min): 2 L/min Pain: Pain Assessment Pain Scale: 0-10 Pain Score: 6  Pain Type: Chronic pain Pain Location: Back Pain Orientation: Lower Pain Descriptors / Indicators: Aching Pain Frequency: Intermittent Pain Onset:  On-going Pain Intervention(s): Medication (See eMAR)  Therapy/Group: Individual Therapy  Salisha Bardsley Melton Krebs 06/10/2018, 8:54 AM

## 2018-06-10 NOTE — Progress Notes (Signed)
Occupational Therapy Session Note  Patient Details  Name: Richard Wang MRN: [id number] Date of Birth: September 27, 1945  Today's Date: 06/10/2018 OT Individual Time: 1410-1505 OT Individual Time Calculation (min): 55 min  and Today's Date: 06/10/2018 OT Missed Time: 20 Minutes Missed Time Reason: Patient fatigue  Short Term Goals: Week 1:  OT Short Term Goal 1 (Week 1): Pt will tolerate sitting EOB for 5 mins in preparation for BADL task OT Short Term Goal 2 (Week 1): Pt will complete sit<>stand with mod A of 1 OT Short Term Goal 3 (Week 1): Pt will complete 1 step of LB dressing  Skilled Therapeutic Interventions/Progress Updates:    Pt greeted semi-reclined in bed after recently finishing breathing treatment. Pt needed encouragement, to participate, but eventually agreeable to shower. Pt on 3L of O2 throughout session with SpO2 maintained at 95% and up. Pt came to EOB with supervision. Sit<>stand with min A, and min A to pivot to wc using RW. Stand-pivot into shower using grab bars with min A. Bathing completed with increased time and min A overall. Pt needed mod A to come to standing from lower tub bench and pt fatigue. Dressing completed wc at the sink with min A to stand and min A to pull pants over hips. Extended rest break, then pt requested to return to bed. Min A squat-pivot back to bed. OT applied heating pad and informed nurse of pt report of 5/10 back pain. Pt in a better mood after shower and stated he was feeling better. Pt left semi-reclined in bed with nursing present.   Therapy Documentation Precautions:  Precautions Precautions: Fall Precaution Comments: watch O2 and HR Restrictions Weight Bearing Restrictions: No General: General OT Amount of Missed Time: 20 Minutes Vital Signs: Therapy Vitals SpO2: 99 % O2 Device: Nasal Cannula O2 Flow Rate (L/min): 3 L/min Pain: Pain Assessment Pain Scale: 0-10 Pain Score: 5  Pain Type: Chronic pain Pain Location: Back Pain  Descriptors / Indicators: Aching;Discomfort Pain Intervention(s): Heat;re[positioned   Therapy/Group: Individual Therapy  Mal Amabile 06/10/2018, 3:21 PM

## 2018-06-10 NOTE — Progress Notes (Signed)
Physical Therapy Session Note  Patient Details  Name: Richard Wang MRN: [id number] Date of Birth: 12-Jan-1946  Today's Date: 06/10/2018 PT Individual Time: 1200-1225 PT Individual Time Calculation (min): 25 min   Short Term Goals: Week 1:  PT Short Term Goal 1 (Week 1): Pt will tolerate 30 min of upright sitting w/o increase in fatigue PT Short Term Goal 2 (Week 1): Pt will ambulate 50' w/ LRAD w/ min assist PT Short Term Goal 3 (Week 1): Pt will transfer bed<>chair w/ min assist  Skilled Therapeutic Interventions/Progress Updates:    Patient received in bed, very fatigued and not feeling well but able to participate in therapy today. Performed bed mobility with S, and required MinA for sit to stand transfers today. Performed two rounds of standing marches in RW at bedside today, on first round patient had HR elevation into 120s and significant fatigue, on second round O2 on 2LPM dropped to 75% and recovered with seated rest. Did not push beyond bedside standing and exercises due to fatigue and vitals response today. He was returned to supine and left in bed with all needs met, bed alarm active this morning.   Therapy Documentation Precautions:  Precautions Precautions: Fall Precaution Comments: watch O2 and HR Restrictions Weight Bearing Restrictions: No   Pain: Pain Assessment Pain Scale: 0-10 Pain Score: 0-No pain    Therapy/Group: Individual Therapy   Deniece Ree PT, DPT, CBIS  Supplemental Physical Therapist [facility]    Pager [phone] Acute Rehab Office [phone]    06/10/2018, 1:19 PM

## 2018-06-11 ENCOUNTER — Inpatient Hospital Stay (HOSPITAL_COMMUNITY): Payer: Medicare Other | Admitting: Occupational Therapy

## 2018-06-11 ENCOUNTER — Inpatient Hospital Stay (HOSPITAL_COMMUNITY): Payer: Medicare Other | Admitting: Physical Therapy

## 2018-06-11 ENCOUNTER — Other Ambulatory Visit: Payer: Self-pay

## 2018-06-11 DIAGNOSIS — Z9981 Dependence on supplemental oxygen: Secondary | ICD-10-CM

## 2018-06-11 LAB — GLUCOSE, CAPILLARY
Glucose-Capillary: 172 mg/dL — ABNORMAL HIGH (ref 70–99)
Glucose-Capillary: 188 mg/dL — ABNORMAL HIGH (ref 70–99)
Glucose-Capillary: 251 mg/dL — ABNORMAL HIGH (ref 70–99)
Glucose-Capillary: 277 mg/dL — ABNORMAL HIGH (ref 70–99)

## 2018-06-11 MED ORDER — LIDOCAINE 5 % EX PTCH
1.0000 | MEDICATED_PATCH | CUTANEOUS | Status: DC
  Administered 2018-06-11 – 2018-06-17 (×6): 1 via TRANSDERMAL
  Filled 2018-06-11 (×7): qty 1

## 2018-06-11 MED ORDER — DOCUSATE SODIUM 100 MG PO CAPS
100.0000 mg | ORAL_CAPSULE | Freq: Three times a day (TID) | ORAL | Status: DC
  Administered 2018-06-11 – 2018-06-13 (×7): 100 mg via ORAL
  Filled 2018-06-11 (×7): qty 1

## 2018-06-11 NOTE — Progress Notes (Signed)
Occupational Therapy Session Note  Patient Details  Name: Richard Wang MRN: [id number] Date of Birth: 08-Oct-1945  Today's Date: 06/11/2018 OT Individual Time: 1415-1530 OT Individual Time Calculation (min): 75 min   Short Term Goals: Week 1:  OT Short Term Goal 1 (Week 1): Pt will tolerate sitting EOB for 5 mins in preparation for BADL task OT Short Term Goal 2 (Week 1): Pt will complete sit<>stand with mod A of 1 OT Short Term Goal 3 (Week 1): Pt will complete 1 step of LB dressing  Skilled Therapeutic Interventions/Progress Updates:    Pt greeted semi-reclined in bed and agreeable to OT treatment session with encouragement. Pt reports feeing tired, but overall better than yesterday. Pt on 3L of O2 throughout session with SpO2 at 90% and above. Pt came to sitting EOB and needed mod A to lift into standing, then CGA to ambulate to wc w/ RW. B UE strength/coordination w/ wc propulsion to therapy gym. Worked on sit<>stands, standing balance/endurance, with standing clothespin activity. Pt tolerated 3 mins standing x 3 trials. Pt needed min-mod A for sit<>stands pending fatigue. LB tehr ex w/ 3 sets of 10 toe taps on cones, hip adduction/abduction, and knee extension. Pt propelled wc back to room and completed squat-pivot back to bed with min A. Pt left semi-reclined in bed with needs met.   Therapy Documentation Precautions:  Precautions Precautions: Fall Precaution Comments: watch O2 and HR Restrictions Weight Bearing Restrictions: No Vital Signs: Therapy Vitals Temp: 97.6 F (36.4 C) Temp Source: Oral Pulse Rate: 84 Resp: 17 BP: 115/83 Patient Position (if appropriate): Sitting Oxygen Therapy SpO2: 98 % O2 Device: Nasal Cannula O2 Flow Rate (L/min): 3 L/min Pain: Pain Assessment Pain Scale: 0-10 Pain Score: 4  Pain Type: Chronic pain Pain Location: Back Pain Orientation: Lower Pain Descriptors / Indicators: Aching Pain Onset: On-going Pain Intervention(s):  Repositioned;Heat applied   Therapy/Group: Individual Therapy  Valma Cava 06/11/2018, 3:47 PM

## 2018-06-11 NOTE — Progress Notes (Signed)
Occupational Therapy Session Note  Patient Details  Name: Richard Wang MRN: [id number] Date of Birth: 22-Jan-1946  Today's Date: 06/11/2018 OT Individual Time: 0940-7680 OT Individual Time Calculation (min): 42 min    Short Term Goals: Week 1:  OT Short Term Goal 1 (Week 1): Pt will tolerate sitting EOB for 5 mins in preparation for BADL task OT Short Term Goal 2 (Week 1): Pt will complete sit<>stand with mod A of 1 OT Short Term Goal 3 (Week 1): Pt will complete 1 step of LB dressing  Skilled Therapeutic Interventions/Progress Updates:    Upon entering the room, pt supine in bed with c/o fatigue and feeling unwell. Pt agreeable to OT intervention from bed level for therapeutic exercises. Pt engaged in B UE strengthening with use of 3 lb resistive dowel rod. Pt needing min cuing for proper technique and multiple rest breaks secondary to fatigue. Pt continues to be on 4 L O2 via [place] and O2 saturation remaining at 93-96% throughout. HR increasing to 120's at end of session. Pt remained in bed at end of session with call bell and all needed items within reach upon exiting the room.   Therapy Documentation Precautions:  Precautions Precautions: Fall Precaution Comments: watch O2 and HR Restrictions Weight Bearing Restrictions: No General:   Vital Signs: Therapy Vitals Pulse Rate: 99 Resp: 18 Patient Position (if appropriate): Lying Oxygen Therapy SpO2: 99 % O2 Device: Nasal Cannula O2 Flow Rate (L/min): 3 L/min Pain: Pain Assessment Pain Scale: 0-10 Pain Score: 0-No pain ADL: ADL Eating: (UTA) Grooming: Setup(washed face in bed with set-up ) Upper Body Bathing: (UTA) ADL Comments: UTA BADLs 2/2 SOB and pt declining to participate   Therapy/Group: Individual Therapy  Alen Bleacher 06/11/2018, 9:58 AM

## 2018-06-11 NOTE — Progress Notes (Signed)
Physical Therapy Session Note  Patient Details  Name: Richard Wang MRN: [id number] Date of Birth: [birth year]  Today's Date: 06/11/2018 PT Individual Time: 1030-1135 PT Individual Time Calculation (min): 65 min   Short Term Goals: Week 1:  PT Short Term Goal 1 (Week 1): Pt will tolerate 30 min of upright sitting w/o increase in fatigue PT Short Term Goal 2 (Week 1): Pt will ambulate 50' w/ LRAD w/ min assist PT Short Term Goal 3 (Week 1): Pt will transfer bed<>chair w/ min assist  Skilled Therapeutic Interventions/Progress Updates:   Pt in supine and agreeable to therapy, no specific c/o pain but ongoing back soreness. Session focused on functional endurance and tolerance to upright mobility. Supine to sit w/ supervision. Stand pivot transfer to w/c w/ RW, min assist. Pt self-propelled w/c to/from therapy gym w/ supervision using BUEs, 50-75' bouts w/ brief rest in between. NuStep @ level 1 w/ all extremities, 5 min. Ambulated 50' w/ RW, CGA. Returned to room and performed transfer to [facility] w/ min assist. Ended session in [facility], call bell in reach and NT aware of pt's status.   No SOB w/ activity this session, moderate increase in work of breathing that resolves w/ prolonged rest. Pt declined SOB. Took our time and took lots of rest breaks this session 2/2 pt's fear and anxiety w/ upright mobility. Vitals as listed below throughout session. Ongoing education regarding importance of monitoring vital signs w/ mobility, self-monitoring for chest pain w/ activity, difference between chest pain and heartburn, and pt's endurance deficits in light of his cardiovascular function. Pt verbalized understanding of education, asks appropriate questions.   -before session: 97% O2 on 2.5 L/min, 105 bpm  -after w/c mobility: 100% O2 on 3 L/min, 114 bpm  -during NuStep: 100% O2 on 3 L/min, 120 bpm  -after ambulating: 100% O2 on 3 L/min, 103 bpm -upon return to room: 95% O2 on 2.5 L/min, 99 bpm  Therapy  Documentation Precautions:  Precautions Precautions: Fall Precaution Comments: watch O2 and HR Restrictions Weight Bearing Restrictions: No Vital Signs: Therapy Vitals Pulse Rate: 99 Resp: 18 Patient Position (if appropriate): Lying Oxygen Therapy SpO2: 99 % O2 Device: Nasal Cannula O2 Flow Rate (L/min): 3 L/min Pain: Pain Assessment Pain Scale: 0-10 Pain Score: 0-No pain  Therapy/Group: Individual Therapy  [name] 06/11/2018, 11:36 AM

## 2018-06-11 NOTE — Plan of Care (Signed)
  Problem: Consults Goal: RH GENERAL PATIENT EDUCATION Description See Patient Education module for education specifics.mod assist  Outcome: Progressing Goal: Skin Care Protocol Initiated - if Braden Score 18 or less Description If consults are not indicated, leave blank or document N/A; mod assistance  Outcome: Progressing Goal: Nutrition Consult-if indicated Description Mod assistance  Outcome: Progressing Goal: Diabetes Guidelines if Diabetic/Glucose > 140 Description If diabetic or lab glucose is > 140 mg/dl - Initiate Diabetes/Hyperglycemia Guidelines & Document Interventions mod assistance  Outcome: Progressing   Problem: RH BOWEL ELIMINATION Goal: RH STG MANAGE BOWEL WITH ASSISTANCE Description STG Manage Bowel with Assistance.mod assistance  Outcome: Progressing Flowsheets (Taken 06/11/2018 1202) STG: Pt will manage bowels with assistance: 4-Minimum assistance Goal: RH STG MANAGE BOWEL W/MEDICATION W/ASSISTANCE Description STG Manage Bowel with Medication with mod  Assistance.  Outcome: Progressing Flowsheets (Taken 06/11/2018 1202) STG: Pt will manage bowels with medication with assistance: 4-Minimal assistance   Problem: RH SKIN INTEGRITY Goal: RH STG SKIN FREE OF INFECTION/BREAKDOWN Description Patient free from skin infection entire stay on rehab min assist  Outcome: Progressing   Problem: RH SAFETY Goal: RH STG ADHERE TO SAFETY PRECAUTIONS W/ASSISTANCE/DEVICE Description STG Adhere to Safety Precautions With  Mod Assistance/Device.  Outcome: Progressing Flowsheets (Taken 06/11/2018 1202) STG:Pt will adhere to safety precautions with assistance/device: 4-Minimal assistance   Problem: RH PAIN MANAGEMENT Goal: RH STG PAIN MANAGED AT OR BELOW PT'S PAIN GOAL Description Pain less than 4  Outcome: Progressing

## 2018-06-11 NOTE — Consult Note (Signed)
Late entry Ref: System, Pcp Not In   Subjective:  Heart rate in 70's to 100 bpm. Chronic respiratory distress persist.  Objective:  Vital Signs in the last 24 hours: Temp:  [97.7 F (36.5 C)-98 F (36.7 C)] 97.7 F (36.5 C) (04/21 0445) Pulse Rate:  [79-105] 99 (04/21 0827) Resp:  [18-20] 18 (04/21 0827) BP: (110-122)/(65-87) 110/65 (04/21 0445) SpO2:  [97 %-100 %] 99 % (04/21 0827) Weight:  [75.7 kg] 75.7 kg (04/21 0500)  Physical Exam: BP Readings from Last 1 Encounters:  06/11/18 110/65     Wt Readings from Last 1 Encounters:  06/11/18 75.7 kg    Weight change: -0.5 kg Body mass index is 23.95 kg/m. HEENT: [place]/AT, Eyes-Conjunctiva-Pale, Sclera-Non-icteric Neck: No JVD, No bruit, Trachea midline. Lungs:  Clearing, Bilateral. Cardiac:  Irregular rhythm, normal S1 and S2, no S3. II/VI systolic murmur. Abdomen:  Soft, non-tender. BS present. Extremities:  No edema present. No cyanosis. No clubbing. CNS: AxOx3, Cranial nerves grossly intact, moves all 4 extremities.  Skin: Warm and dry.   Intake/Output from previous day: 04/20 0701 - 04/21 0700 In: 660 [P.O.:660] Out: 1750 [Urine:1750]    Lab Results: BMET    Component Value Date/Time   NA 137 06/10/2018 0946   NA 135 06/08/2018 0554   NA 131 (L) 06/07/2018 0637   K 3.9 06/10/2018 0946   K 4.8 06/08/2018 0554   K 5.7 (H) 06/07/2018 0637   CL 102 06/10/2018 0946   CL 100 06/08/2018 0554   CL 100 06/07/2018 0637   CO2 22 06/10/2018 0946   CO2 20 (L) 06/08/2018 0554   CO2 17 (L) 06/07/2018 0637   GLUCOSE 193 (H) 06/10/2018 0946   GLUCOSE 148 (H) 06/08/2018 0554   GLUCOSE 243 (H) 06/07/2018 0637   BUN 65 (H) 06/10/2018 0946   BUN 85 (H) 06/08/2018 0554   BUN 80 (H) 06/07/2018 0637   CREATININE 2.17 (H) 06/10/2018 0946   CREATININE 2.56 (H) 06/08/2018 0554   CREATININE 2.47 (H) 06/07/2018 0637   CALCIUM 9.2 06/10/2018 0946   CALCIUM 9.6 06/08/2018 0554   CALCIUM 9.0 06/07/2018 0637   GFRNONAA 30 (L)  06/10/2018 0946   GFRNONAA 24 (L) 06/08/2018 0554   GFRNONAA 25 (L) 06/07/2018 0637   GFRAA 34 (L) 06/10/2018 0946   GFRAA 28 (L) 06/08/2018 0554   GFRAA 29 (L) 06/07/2018 0637   CBC    Component Value Date/Time   WBC 9.6 06/10/2018 0946   RBC 3.09 (L) 06/10/2018 0946   HGB 8.1 (L) 06/10/2018 0946   HCT 26.5 (L) 06/10/2018 0946   PLT 186 06/10/2018 0946   MCV 85.8 06/10/2018 0946   MCH 26.2 06/10/2018 0946   MCHC 30.6 06/10/2018 0946   RDW 17.0 (H) 06/10/2018 0946   LYMPHSABS 0.7 06/10/2018 0946   MONOABS 0.6 06/10/2018 0946   EOSABS 0.0 06/10/2018 0946   BASOSABS 0.0 06/10/2018 0946   HEPATIC Function Panel Recent Labs    05/20/18 0554 06/07/18 0637 06/10/18 0946  PROT 5.9* 5.9* 5.6*   HEMOGLOBIN A1C No components found for: HGA1C,  MPG CARDIAC ENZYMES Lab Results  Component Value Date   CKTOTAL 16 (L) 06/08/2018   CKMB 1.9 06/08/2018   TROPONINI 0.27 (HH) 06/08/2018   TROPONINI 0.29 (HH) 06/07/2018   TROPONINI 0.31 (HH) 06/07/2018   BNP No results for input(s): PROBNP in the last 8760 hours. TSH Recent Labs    04/28/18 1052 06/03/18 0012  TSH 0.109* 1.411   CHOLESTEROL No results  for input(s): CHOL in the last 8760 hours.  Scheduled Meds: . allopurinol  100 mg Oral Daily  . alum & mag hydroxide-simeth  30 mL Oral Once  . alum & mag hydroxide-simeth  1 application Topical Once  . amiodarone  200 mg Oral Daily  . aspirin EC  81 mg Oral Daily  . atorvastatin  40 mg Oral q1800  . dextromethorphan-guaiFENesin  1 tablet Oral BID  . diltiazem  300 mg Oral Daily  . docusate sodium  100 mg Oral TID  . folic acid  1 mg Oral Daily  . furosemide  40 mg Oral Daily  . heparin injection (subcutaneous)  5,000 Units Subcutaneous Q8H  . hyoscyamine  0.375 mg Oral Q12H  . insulin aspart  0-15 Units Subcutaneous TID WC  . insulin glargine  5 Units Subcutaneous Daily  . insulin glargine  7 Units Subcutaneous QHS  . ipratropium  0.5 mg Nebulization TID  .  levalbuterol  1.25 mg Nebulization TID  . lidocaine  1 patch Transdermal Q24H  . loratadine  10 mg Oral Daily  . metoprolol tartrate  50 mg Oral BID  . pantoprazole  40 mg Oral BID  . polyethylene glycol  17 g Oral Daily  . predniSONE  50 mg Oral Q breakfast  . tamsulosin  0.4 mg Oral QPC supper  . 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

## 2018-06-11 NOTE — Progress Notes (Addendum)
[facility] PHYSICAL MEDICINE & REHABILITATION PROGRESS NOTE  Subjective/Complaints: Sitting up in bed this morning.  He states he slept fairly overnight.  He states better than yesterday.  He notes back pain.  ROS: + Chronic back pain.  Denies CP, SOB, N/V/D  Objective: Vital Signs: Blood pressure 110/65, pulse 99, temperature 97.7 F (36.5 C), resp. rate 18, weight 75.7 kg, SpO2 99 %. No results found. Recent Labs    06/10/18 0946  WBC 9.6  HGB 8.1*  HCT 26.5*  PLT 186   Recent Labs    06/10/18 0946  NA 137  K 3.9  CL 102  CO2 22  GLUCOSE 193*  BUN 65*  CREATININE 2.17*  CALCIUM 9.2    Physical Exam: BP 110/65 (BP Location: Left Arm)   Pulse 99   Temp 97.7 F (36.5 C)   Resp 18   Wt 75.7 kg   SpO2 99%   BMI 23.95 kg/m  Constitutional: No distress . Vital signs reviewed. HENT: Normocephalic.  Atraumatic. Eyes: EOMI.  No discharge. Cardiovascular: No JVD. Respiratory: Normal effort.  + [place]. GI: Non-distended. Musc: No edema or tenderness in extremities. Neurological: Patient is alert  Follows commands.  Motor: Bilateral upper extremities: 4+/5 proximal distal Right lower extremity: Hip flexion, knee extension 3/5, ankle dorsiflexion 4-/5 Left lower extremity: Hip flexion, knee extension 3/5, ankle dorsiflexion 1/5  Skin: Color is chronically grayin face Psych: Flat  Assessment/Plan: 1. Functional deficits secondary to chronic respiratory failure with multiple medical complications, including cardiac and rheumatological which require 3+ hours per day of interdisciplinary therapy in a comprehensive inpatient rehab setting.  Physiatrist is providing close team supervision and 24 hour management of active medical problems listed below.  Physiatrist and rehab team continue to assess barriers to discharge/monitor patient progress toward functional and medical goals  Care Tool:  Bathing    Body parts bathed by patient: Right arm, Left arm, Chest,  Abdomen, Front perineal area, Right upper leg, Left upper leg, Face, Right lower leg, Left lower leg   Body parts bathed by helper: Buttocks     Bathing assist Assist Level: Contact Guard/Touching assist     Upper Body Dressing/Undressing Upper body dressing Upper body dressing/undressing activity did not occur (including orthotics): Refused What is the patient wearing?: Pull over shirt    Upper body assist Assist Level: Minimal Assistance - Patient > 75%    Lower Body Dressing/Undressing Lower body dressing    Lower body dressing activity did not occur: Refused What is the patient wearing?: Pants     Lower body assist Assist for lower body dressing: Minimal Assistance - Patient > 75%     Toileting Toileting    Toileting assist Assist for toileting: Moderate Assistance - Patient 50 - 74%     Transfers Chair/bed transfer  Transfers assist  Chair/bed transfer activity did not occur: Refused  Chair/bed transfer assist level: Minimal Assistance - Patient > 75%     Locomotion Ambulation   Ambulation assist   Ambulation activity did not occur: Safety/medical concerns  Assist level: Contact Guard/Touching assist Assistive device: Walker-rolling Max distance: 100'   Walk 10 feet activity   Assist  Walk 10 feet activity did not occur: Safety/medical concerns  Assist level: Contact Guard/Touching assist Assistive device: Walker-rolling   Walk 50 feet activity   Assist Walk 50 feet with 2 turns activity did not occur: Safety/medical concerns  Assist level: Contact Guard/Touching assist Assistive device: Walker-rolling    Walk 150 feet activity  Assist Walk 150 feet activity did not occur: Safety/medical concerns         Walk 10 feet on uneven surface  activity   Assist Walk 10 feet on uneven surfaces activity did not occur: Safety/medical concerns         Wheelchair     Assist Will patient use wheelchair at discharge?: Yes Type of  Wheelchair: Manual Wheelchair activity did not occur: Safety/medical concerns         Wheelchair 50 feet with 2 turns activity    Assist    Wheelchair 50 feet with 2 turns activity did not occur: Safety/medical concerns       Wheelchair 150 feet activity     Assist Wheelchair 150 feet activity did not occur: Safety/medical concerns          Medical Problem List and Plan: 1.Debilitysecondary to acute on chronic respiratory failurewith hypoxemia and hypercarbia, CAD/CHF with history of colitis on Remicade.  Continue CIR   Prednisone decreased to 50 on 4/20  Continue supplemental oxygen  Flutter valve ordered 2. Antithrombotics: -DVT/anticoagulation:Subcutaneous heparin -antiplatelet therapy: Aspirin 81 mg daily 3. Pain Management/Chronic back pain:Hydrocodone and Robaxin as needed  Needs regular encouragement  -kpad for chest wall, prn ice  -added levbid for GI cramping, will consider DC in future  Lidoderm patch added on 4/21 4. Mood:Provide emotional support  Klonopin 3 times daily started on 4/17, increased on 4/20 -antipsychotic agents: N/A 5. Neuropsych: This patientiscapable of making decisions on hisown behalf. 6. Skin/Wound Care:Routine skin checks 7. Fluids/Electrolytes/Nutrition:Routine in and outs 8.CAD with non-STEMI. Continue aspirin. Follow-up per cardiology services  Anxiety remains a contributing factor to chest pain, however objective findings present as well.   Repeat ECG on 4/18 reviewed, showing A. fib and prolonged QTC  Continue with cards recs 9.Atrial fibrillation. Diltiazem daily/amiodarone daily/hydralazine/metoprolol.   Medications being adjusted by Cards, appreciate recs  Heart rate relatively controlled on 4/20 10.Hypertension.  See #9  Controlled on 4/21  Monitor with increased mobility 11. Diastolic congestive heart failure. Monitor for any signs of fluid overload.Continue Lasix as  directed  Daily weights ordered.  Filed Weights   06/09/18 0435 06/10/18 0406 06/11/18 0500  Weight: 76.4 kg 76.2 kg 75.7 kg   Stable on 4/21 12. Urinary retention/Klebsiella pneumo UTI. Urecholine 10 mg 3 times a day, Flomax 0.4 mg daily. complete course of Bactrim through 4/21 13. Hyperlipidemia. Lipitor 14.  Steroid-induced hyperglycemia with hx of DM. Latest hemoglobin A1c 5.0.   Patient on metformin 1000 mg twice a day prior to admissionbut held due to mildly elevated creatinine.   Presently on Lantus insulin 7 units daily at bedtime, with 5 mg daily.   Check blood sugars before meals and at bedtime. Diabetic teaching  Monitor with taper of prednisone.    Labile on 4/21 15. Crohn's colitis with history of GI bleed. Protonix 40 mg twice a day.Patientdid receive Remicade 06/05/2018 and is scheduled every 8 weeks  Need to be particularly cautious due to immunosuppression  continue levbid  for GI spasms  MiraLAX daily ordered on 4/20  Colace 3 times daily ordered on 4/21 16. AKI/renal insufficiency. Strict in and out's.   Creatinine 2.17 on 4/20  Will consider IVF if necessary  Encourage fluids 16. Tobacco abuse. Counseling 17. Acute on chronic anemia.   Hemoglobin 8.1 on 4/20 18. Hyponatremia  Sodium 137 on 4/20 19.  Hyperkalemia: Resolved  Potassium 3.9 on 4/20 20.  Transaminitis: Resolved  LFTs within normal limits on 4/20 21.  Leukocytosis: Resolved  Afebrile    LOS: 5 days A FACE TO FACE EVALUATION WAS PERFORMED  Ankit Karis Jubanil Patel 06/11/2018, 8:45 AM

## 2018-06-12 ENCOUNTER — Inpatient Hospital Stay (HOSPITAL_COMMUNITY): Payer: Medicare Other | Admitting: Physical Therapy

## 2018-06-12 ENCOUNTER — Encounter (HOSPITAL_COMMUNITY): Payer: Medicare Other | Admitting: Psychology

## 2018-06-12 ENCOUNTER — Inpatient Hospital Stay (HOSPITAL_COMMUNITY): Payer: Medicare Other

## 2018-06-12 DIAGNOSIS — R339 Retention of urine, unspecified: Secondary | ICD-10-CM

## 2018-06-12 LAB — GLUCOSE, CAPILLARY
Glucose-Capillary: 229 mg/dL — ABNORMAL HIGH (ref 70–99)
Glucose-Capillary: 124 mg/dL — ABNORMAL HIGH (ref 70–99)
Glucose-Capillary: 249 mg/dL — ABNORMAL HIGH (ref 70–99)
Glucose-Capillary: 263 mg/dL — ABNORMAL HIGH (ref 70–99)

## 2018-06-12 MED ORDER — IPRATROPIUM BROMIDE 0.02 % IN SOLN
0.5000 mg | Freq: Two times a day (BID) | RESPIRATORY_TRACT | Status: DC
  Administered 2018-06-12 (×2): 0.5 mg via RESPIRATORY_TRACT
  Filled 2018-06-12 (×2): qty 2.5

## 2018-06-12 MED ORDER — LEVALBUTEROL HCL 1.25 MG/0.5ML IN NEBU
1.2500 mg | INHALATION_SOLUTION | Freq: Two times a day (BID) | RESPIRATORY_TRACT | Status: DC
  Administered 2018-06-12 (×2): 1.25 mg via RESPIRATORY_TRACT
  Filled 2018-06-12 (×3): qty 0.5

## 2018-06-12 MED ORDER — CALCIUM CARBONATE ANTACID 500 MG PO CHEW: 1.0000 | CHEWABLE_TABLET | Freq: Two times a day (BID) | ORAL | Status: DC | PRN

## 2018-06-12 MED ORDER — ALUM & MAG HYDROXIDE-SIMETH 200-200-20 MG/5ML PO SUSP: 15.0000 mL | Freq: Four times a day (QID) | ORAL | Status: DC | PRN

## 2018-06-12 NOTE — Progress Notes (Signed)
Physical Therapy Session Note  Patient Details  Name: Richard Wang MRN: [id number] Date of Birth: 01-04-1946  Today's Date: 06/12/2018 PT Individual Time: 1015-1125 PT Individual Time Calculation (min): 70 min   Short Term Goals: Week 1:  PT Short Term Goal 1 (Week 1): Pt will tolerate 30 min of upright sitting w/o increase in fatigue PT Short Term Goal 2 (Week 1): Pt will ambulate 50' w/ LRAD w/ min assist PT Short Term Goal 3 (Week 1): Pt will transfer bed<>chair w/ min assist  Skilled Therapeutic Interventions/Progress Updates:   Pt in supine and agreeable to therapy, pain as detailed below. Supervision bed mobility w/ increased time. Sit<>stand transfer w/ min assist and CGA stand pivot to w/c. Pt self-propelled w/c to/from therapy gym w/ supervision. Worked on sit<>stands and functional strengthening in parallel bars this session. Blocked practice of sit<>stands w/ BUE push up on armrests w/ CGA-min assist and then RUE on armrest and LUE on bar, also w/ CGA-min assist. Pt continues to require heavy UE assist w/ all standing mobility, discussed use of slide board when going home for transfers from surfaces w/o armrests as this is most difficult for him and family at home cannot physically assist. Performed 10+ stands in total w/ prolonged rest break in between 2/2 fatigue. Started session on 3 L/min O2 @ 100%, lowered to 2 L/min for majority of session and O2 stayed at 95-100% w/o SOB, mild increase in work of breathing that resolves w/ rest. HR remained in low 100s throughout. Pt c/o dizziness towards end of session 3-4/10, vitals WNL and returned to room. O2 turned back up to 3 L/min as pt c/o increased SOB. Stand pivot transfer back to supine w/ CGA and ended session in supine, all needs in reach. O2 down to 83% on 3 L/min after last transfer back to supine, returned to 100% w/ a few minutes of rest in reclined position, pt reported dizziness 1/10 at rest. Turned O2 to 2 L/min, monitored pt  for a few minutes and O2 remained >96%, SOB resolved. Made RN aware.   Therapy Documentation Precautions:  Precautions Precautions: Fall Precaution Comments: watch O2 and HR Restrictions Weight Bearing Restrictions: No Vital Signs: Therapy Vitals Pulse Rate: 98 Resp: 18 Patient Position (if appropriate): Lying Oxygen Therapy SpO2: 99 % O2 Device: Nasal Cannula O2 Flow Rate (L/min): 3 L/min Pain:    Therapy/Group: Individual Therapy  [name] 06/12/2018, 11:26 AM

## 2018-06-12 NOTE — Progress Notes (Signed)
Chaplain responded to spiritual consult. Patient interested in living will.  Chaplain brought Advanced Directive to pt and explained parts 1 & 2.  Explained that due to Covid there are no persons available to be witnesses.  Patient may choose to complete document after discharge. Lynnell Chad Pager [phone]

## 2018-06-12 NOTE — Progress Notes (Signed)
Occupational Therapy Session Note  Patient Details  Name: Richard Wang MRN: [id number] Date of Birth: Jan 11, 1946  Today's Date: 06/12/2018 OT Individual Time: 1236-1330 OT Individual Time Calculation (min): 54 min    Short Term Goals: Week 1:  OT Short Term Goal 1 (Week 1): Pt will tolerate sitting EOB for 5 mins in preparation for BADL task OT Short Term Goal 2 (Week 1): Pt will complete sit<>stand with mod A of 1 OT Short Term Goal 3 (Week 1): Pt will complete 1 step of LB dressing  Skilled Therapeutic Interventions/Progress Updates:    1:1. Pt received finishing lunch ready to go. Pt compeltes stand pivot transfers with RW with mIN lifting A to power up into standing. Pt completes w/c propulsion to/from all tx destinations. Pt completes seated boxing activity with equal parts for and rest 3x30 seconds and 3c40 seconds with prolonged breaks between rounds. Pt completes 3x5 sit ups from wedge with S for core strengthening. Exited session wihtpt seated in bed, exit alarm on and pt receiving pain medication from RN  Therapy Documentation Precautions:  Precautions Precautions: Fall Precaution Comments: watch O2 and HR Restrictions Weight Bearing Restrictions: No General:   Vital Signs:   Pain:   ADL: ADL Eating: (UTA) Grooming: Setup(washed face in bed with set-up ) Upper Body Bathing: (UTA) ADL Comments: UTA BADLs 2/2 SOB and pt declining to participate Vision   Perception    Praxis   Exercises:   Other Treatments:     Therapy/Group: Individual Therapy  Shon Hale 06/12/2018, 1:30 PM

## 2018-06-12 NOTE — Progress Notes (Signed)
Occupational Therapy Session Note  Patient Details  Name: STEPFON MEYEROWITZ MRN: [id number] Date of Birth: March 28, 1945  Today's Date: 06/12/2018 OT Individual Time: 5465-6812 OT Individual Time Calculation (min): 55 min    Short Term Goals: Week 1:  OT Short Term Goal 1 (Week 1): Pt will tolerate sitting EOB for 5 mins in preparation for BADL task OT Short Term Goal 2 (Week 1): Pt will complete sit<>stand with mod A of 1 OT Short Term Goal 3 (Week 1): Pt will complete 1 step of LB dressing  Skilled Therapeutic Interventions/Progress Updates:    1:1. Pt received with no pain reported but agreeable to OT. Pt completes semi reclined to sitting EOB with S and squat pivot transfer with MIN A to w/c with VC for hand placement. Pt completes hair washingwith shower cap at sink as well as combing hair seated with increased time d/t frequent rest breaks. Pt completes oral care at sink seated with set up. Pt completes standing card matching task with 3 seated rest breaks and facilitation of lateral weight shifting with reaching in mod ranges outside BOS. Pt completes 1x15 3# dowel rod circuit for global BUE strengthening required for BADLs  Therapy Documentation Precautions:  Precautions Precautions: Fall Precaution Comments: watch O2 and HR Restrictions Weight Bearing Restrictions: No General:   Vital Signs: Therapy Vitals Temp: 97.6 F (36.4 C) Temp Source: Oral Pulse Rate: 98 Resp: 18 BP: 117/72 Patient Position (if appropriate): Lying Oxygen Therapy SpO2: 99 % O2 Device: Nasal Cannula O2 Flow Rate (L/min): 3 L/min Pain: Pain Assessment Pain Scale: 0-10 Pain Score: 4  Faces Pain Scale: Hurts a little bit Pain Type: Chronic pain Pain Location: Abdomen Pain Descriptors / Indicators: Aching Pain Frequency: Intermittent Pain Onset: Gradual Pain Intervention(s): Medication (See eMAR) ADL: ADL Eating: (UTA) Grooming: Setup(washed face in bed with set-up ) Upper Body Bathing:  (UTA) ADL Comments: UTA BADLs 2/2 SOB and pt declining to participate Vision   Perception    Praxis   Exercises:   Other Treatments:     Therapy/Group: Individual Therapy  Shon Hale 06/12/2018, 8:20 AM

## 2018-06-12 NOTE — Consult Note (Signed)
Neuropsychological Consultation   Patient:   Richard Wang   DOB:   12-07-1945  MR Number:  [id number]  Location:  [facility] [address] CENTER [id number] [place] [address] Dept: [phone] Loc: [phone]           Date of Service:   06/12/2018  Start Time:   2 PM End Time:   3 PM  Provider/Observer:  Arley Phenix, Psy.D.       Clinical Neuropsychologist       Billing Code/Service: [phone] 1 unit:  1 hour in-person visit.  Chief Complaint:    Richard Wang is a 73 year old male with history of COPD with remote tobacco abuse, Crohn's colitis/GI bleed, diabetes, chronic low back pain with foot drop, CAD, renal insufficiency, Afib and hypertension.  Patient has had long period cardio and pulmonary issues.  He has had to leave CIR at one point due to cardiovascular issues.  The patient has history of anxiety and some coping issues related to extensive medical issues.    Reason for Service:  Patient was referred for neuropsychological consultation due to coping and adjustment issues.  Below is the HPI for the current admission.    XKG:YJEHUDJSHFWY 73 year old right-handed male with history of COPD with remote tobacco abuse not on home oxygen at baseline, Crohn's colitis/GI bleed, diabetes mellitus, chronic low back pain with foot drop, CAD with non-STEMI maintained on aspirin, renal insufficiency, atrial fibrillation and hypertension. Patient initially presented to [facility] with increasing shortness of breath and lower extremity edema. Chest x-ray and echocardiogram showed moderate LV systolic dysfunction and moderate to severe MR and moderate pulmonary systolic hypertension consistent with CHF. Patient was started on BiPAP however deteriorated and ended up having to be intubated placed on the ventilator. Patient subsequently complications associated with worsening renal function, congestive  heart failure. Attempts at extubation failed and he was transferred to [facility] 04/26/2018 for ventilation wean. Initially on IV Solu-Medrol transition to POprednisone after ventilation discontinued. Tracheostomy tube was not needed. Patient later extubated successfully and monitored. Continued on prednisone with slow taper. Subcutaneous heparin for DVT prophylaxis. Remained on aspirin therapy as well as Cardizem for history of atrial fibrillation. Therapy evaluations completed and patient was admitted for a comprehensive rehab program 05/17/2018. Patient with steady progressive gains. Noted on 06/02/2018 around 2 PM developed rapid heart rate nonspecific chest pain increasing shortness of breath and hypoxia. He did receive nitro x2 and morphine 2 mg x 2 with minimal relief. He was saturating well on 8 L of oxygen. Heart rate noted to be in the 160s. Troponin elevated 0.14-1.24. EKG showed rapid rate possibly some worsening heart block. Chest x-ray concerning for mild bilateral pulmonary edema. He was discharged to acute care services for ongoing evaluation 06/02/2018.Cardiology service follow-up Dr. Algie Coffer. Echocardiogram with ejection fraction of 35 to 40% moderately reduced systolic function. His amiodarone was resumed for heart rate control and myocardial ischemia. Intravenous Cardizem was initiated and continued on telemetry with transition to PO diltiazem.Patient has been using nonrebreather mask at 4 L for comfort can use nasal cannula but prefers facemask if possible.He remains on subcutaneous heparin for DVT prophylaxis. His diet has been advanced to a regular consistency. AKI felt to be related to diuresis creatinine 2.08. Metformin was held due to elevations in creatinine.Noted history of Crohn's colitis and did receive Remicade after contacts made to Dr.Wimmer [phone] did have some  bouts of urinary retention  maintained on low-dose Urecholine. Klebsiella pneumo UTI completing course of Bactrim.Therapies have been resumed patient is admitted to inpatient rehab service to continue comprehensive rehab.  Current Status:  Richard Wang denies significant increase in anxiety.  Reports taht his cognition has been improving and he was well oriented today with good mental status.  Speech was slow but clear and articulate.  Skin color grey, but this is reported to be typical state.  Patient has had concerns about getting a power of attorney completed as he needs niece to see to his financial affairs.  The patient reports that she has been doing this for him already and he trusts her more than other family members.  Understands why this can't be completed right now in hospital.    Behavioral Observation: Richard Wang  presents as a 73 y.o.-year-old Right Caucasian Male who appeared his stated age. his dress was Appropriate and he was Well Groomed and his manners were Appropriate to the situation.  his participation was indicative of Appropriate and Attentive behaviors.  There were any physical disabilities noted.  he displayed an appropriate level of cooperation and motivation.     Interactions:    Active Appropriate and Attentive  Attention:   abnormal but improving and near baseline according to patient.  Memory:   within normal limits; recent and remote memory intact  Visuo-spatial:  not examined  Speech (Volume):  low  Speech:   normal; normal  Thought Process:  Coherent and Relevant  Though Content:  WNL; not suicidal and not homicidal  Orientation:   person, place, time/date and situation  Judgment:   Fair  Planning:   Fair  Affect:    Anxious  Mood:    Anxious  Insight:   Fair  Intelligence:   normal  Medical History:   Past Medical History:  Diagnosis Date  . Acute and chronic respiratory failure with hypoxia (HCC)   . Acute myocardial infarction, subendocardial infarction, subsequent  episode of care (HCC)   . Acute systolic heart failure (HCC)   . Atrial fibrillation (HCC)   . Chronic atrial fibrillation   . Crohn's colitis (HCC)   . Diabetes mellitus without complication (HCC)   . Hypertension   . Sepsis with acute organ dysfunction (HCC)   . Unspecified septicemia(038.9) (HCC)        Psychiatric History:  No significant past psychiatric history.  Family Med/Psych History:  Family History  Problem Relation Age of Onset  . Bone cancer Brother     Risk of Suicide/Violence: low Patient denies SI or HI.  Impression/DX:  Richard Wang is a 73 year old male with history of COPD with remote tobacco abuse, Crohn's colitis/GI bleed, diabetes, chronic low back pain with foot drop, CAD, renal insufficiency, Afib and hypertension.  Patient has had long period cardio and pulmonary issues.  He has had to leave CIR at one point due to cardiovascular issues.  The patient has history of anxiety and some coping issues related to extensive medical issues.    Richard Wang denies significant increase in anxiety.  Reports taht his cognition has been improving and he was well oriented today with good mental status.  Speech was slow but clear and articulate.  Skin color grey, but this is reported to be typical state.  Patient has had concerns about getting a power of attorney completed as he needs niece to see to his financial affairs.  The patient reports that she has been doing  this for him already and he trusts her more than other family members.  Understands why this can't be completed right now in hospital.     Disposition/Plan:  One hour in-person visit working on coping and adjustment issues with extensive medical issues and long hospital course.  Diagnosis:    Debility, Anxiety        Electronically Signed   _______________________ Arley Phenix, Psy.D.

## 2018-06-12 NOTE — Progress Notes (Signed)
[facility] PHYSICAL MEDICINE & REHABILITATION PROGRESS NOTE  Subjective/Complaints: Patient seen sitting up in bed this morning.  He states he slept fairly overnight due to his machine beeping.  He states he had a decent bowel movement yesterday.  He notes benefit with Lidoderm patch.  ROS: Denies CP, SOB, N/V/D  Objective: Vital Signs: Blood pressure 117/72, pulse 98, temperature 97.6 F (36.4 C), temperature source Oral, resp. rate 18, weight 75.8 kg, SpO2 99 %. No results found. Recent Labs    06/10/18 0946  WBC 9.6  HGB 8.1*  HCT 26.5*  PLT 186   Recent Labs    06/10/18 0946  NA 137  K 3.9  CL 102  CO2 22  GLUCOSE 193*  BUN 65*  CREATININE 2.17*  CALCIUM 9.2    Physical Exam: BP 117/72 (BP Location: Right Arm)   Pulse 98   Temp 97.6 F (36.4 C) (Oral)   Resp 18   Wt 75.8 kg   SpO2 99%   BMI 23.98 kg/m  Constitutional: No distress . Vital signs reviewed. HENT: Normocephalic.  Atraumatic. Eyes: EOMI.  No discharge. Cardiovascular: No JVD. Respiratory: Normal effort.  + [place]. GI: Non-distended. Musc: No edema or tenderness in extremities. Neurological: Patient is alert  Follows commands.  Motor: Bilateral upper extremities: 4+/5 proximal distal Right lower extremity: Hip flexion, knee extension 3/5, ankle dorsiflexion 4-/5, slowly improving Left lower extremity: Hip flexion, knee extension 3/5, ankle dorsiflexion 1/5, slowly improving Skin: Color is chronically grayin face Psych: Flat  Assessment/Plan: 1. Functional deficits secondary to chronic respiratory failure with multiple medical complications, including cardiac and rheumatological which require 3+ hours per day of interdisciplinary therapy in a comprehensive inpatient rehab setting.  Physiatrist is providing close team supervision and 24 hour management of active medical problems listed below.  Physiatrist and rehab team continue to assess barriers to discharge/monitor patient progress toward  functional and medical goals  Care Tool:  Bathing    Body parts bathed by patient: Right arm, Left arm, Chest, Abdomen, Front perineal area, Right upper leg, Left upper leg, Face, Right lower leg, Left lower leg   Body parts bathed by helper: Buttocks     Bathing assist Assist Level: Contact Guard/Touching assist     Upper Body Dressing/Undressing Upper body dressing Upper body dressing/undressing activity did not occur (including orthotics): Refused What is the patient wearing?: Pull over shirt    Upper body assist Assist Level: Minimal Assistance - Patient > 75%    Lower Body Dressing/Undressing Lower body dressing    Lower body dressing activity did not occur: Refused What is the patient wearing?: Pants     Lower body assist Assist for lower body dressing: Minimal Assistance - Patient > 75%     Toileting Toileting    Toileting assist Assist for toileting: Moderate Assistance - Patient 50 - 74%     Transfers Chair/bed transfer  Transfers assist  Chair/bed transfer activity did not occur: Refused  Chair/bed transfer assist level: Contact Guard/Touching assist     Locomotion Ambulation   Ambulation assist   Ambulation activity did not occur: Safety/medical concerns  Assist level: Contact Guard/Touching assist Assistive device: Walker-rolling Max distance: 50'   Walk 10 feet activity   Assist  Walk 10 feet activity did not occur: Safety/medical concerns  Assist level: Contact Guard/Touching assist Assistive device: Walker-rolling   Walk 50 feet activity   Assist Walk 50 feet with 2 turns activity did not occur: Safety/medical concerns  Assist level: Contact Guard/Touching  assist Assistive device: Walker-rolling    Walk 150 feet activity   Assist Walk 150 feet activity did not occur: Safety/medical concerns         Walk 10 feet on uneven surface  activity   Assist Walk 10 feet on uneven surfaces activity did not occur:  Safety/medical concerns         Wheelchair     Assist Will patient use wheelchair at discharge?: Yes Type of Wheelchair: Manual Wheelchair activity did not occur: Safety/medical concerns         Wheelchair 50 feet with 2 turns activity    Assist    Wheelchair 50 feet with 2 turns activity did not occur: Safety/medical concerns       Wheelchair 150 feet activity     Assist Wheelchair 150 feet activity did not occur: Safety/medical concerns          Medical Problem List and Plan: 1.Debilitysecondary to acute on chronic respiratory failurewith hypoxemia and hypercarbia, CAD/CHF with history of colitis on Remicade.  Continue CIR   Prednisone decreased to 50 on 4/20  Continue supplemental oxygen  Flutter valve ordered  Team conference today to discuss current and goals and coordination of care, home and environmental barriers, and discharge planning with nursing, case manager, and therapies.  2. Antithrombotics: -DVT/anticoagulation:Subcutaneous heparin -antiplatelet therapy: Aspirin 81 mg daily 3. Pain Management/Chronic back pain:Hydrocodone and Robaxin as needed  Needs regular encouragement  -kpad for chest wall, prn ice  -added levbid for GI cramping, DC'd on 4/22  Lidoderm patch added on 4/21 4. Mood:Provide emotional support  Klonopin 3 times daily started on 4/17, increased on 4/20 -antipsychotic agents: N/A 5. Neuropsych: This patientiscapable of making decisions on hisown behalf. 6. Skin/Wound Care:Routine skin checks 7. Fluids/Electrolytes/Nutrition:Routine in and outs 8.CAD with non-STEMI. Continue aspirin. Follow-up per cardiology services  Anxiety remains a contributing factor to chest pain, however objective findings present as well.   Repeat ECG on 4/18 reviewed, showing A. fib and prolonged QTC  Continue with cards recs 9.Atrial fibrillation. Diltiazem daily/amiodarone  daily/hydralazine/metoprolol.   Medications being adjusted by Cards, appreciate recs  Heart rate relatively controlled on 4/22 10.Hypertension.  See #9  Controlled on 4/22  Monitor with increased mobility 11. Diastolic congestive heart failure. Monitor for any signs of fluid overload.Continue Lasix as directed  Daily weights ordered.  Filed Weights   06/10/18 0406 06/11/18 0500 06/12/18 0600  Weight: 76.2 kg 75.7 kg 75.8 kg   Stable on 4/22 12. Urinary retention  Urecholine 10 mg 3 times a day, Flomax 0.4 mg daily.    Klebsiella pneumo UTI. Completed course of Bactrim on 4/22 13. Hyperlipidemia. Lipitor 14.  Steroid-induced hyperglycemia with hx of DM. Latest hemoglobin A1c 5.0.   Patient on metformin 1000 mg twice a day prior to admissionbut held due to mildly elevated creatinine.   Presently on Lantus insulin 7 units daily at bedtime, with 5 mg daily.   Check blood sugars before meals and at bedtime. Diabetic teaching  Monitor with taper of prednisone.    Elevated on 4/22 15. Crohn's colitis with history of GI bleed. Protonix 40 mg twice a day.Patientdid receive Remicade 06/05/2018 and is scheduled every 8 weeks  Need to be particularly cautious due to immunosuppression  MiraLAX daily ordered on 4/20  Colace 3 times daily ordered on 4/21  Improving 16. AKI/renal insufficiency. Strict in and out's.   Creatinine 2.17 on 4/20  Will consider IVF if necessary  Encourage fluids 16. Tobacco abuse. Counseling 17.  Acute on chronic anemia.   Hemoglobin 8.1 on 4/20 18. Hyponatremia  Sodium 137 on 4/20  Plan to order labs for the end of the week 19.  Hyperkalemia: Resolved  Potassium 3.9 on 4/20 20.  Transaminitis: Resolved  LFTs within normal limits on 4/20 21.  Leukocytosis: Resolved  Afebrile    LOS: 6 days A FACE TO FACE EVALUATION WAS PERFORMED  Kiyra Slaubaugh Karis Juba 06/12/2018, 8:27 AM

## 2018-06-12 NOTE — Plan of Care (Signed)
  Problem: Consults Goal: RH GENERAL PATIENT EDUCATION Description See Patient Education module for education specifics.mod assist  Outcome: Progressing Goal: Skin Care Protocol Initiated - if Braden Score 18 or less Description If consults are not indicated, leave blank or document N/A; mod assistance  Outcome: Progressing Goal: Nutrition Consult-if indicated Description Mod assistance  Outcome: Progressing Goal: Diabetes Guidelines if Diabetic/Glucose > 140 Description If diabetic or lab glucose is > 140 mg/dl - Initiate Diabetes/Hyperglycemia Guidelines & Document Interventions mod assistance  Outcome: Progressing   Problem: RH BOWEL ELIMINATION Goal: RH STG MANAGE BOWEL WITH ASSISTANCE Description STG Manage Bowel with Assistance.mod assistance  Outcome: Progressing Flowsheets (Taken 06/12/2018 1155) STG: Pt will manage bowels with assistance: 4-Minimum assistance Goal: RH STG MANAGE BOWEL W/MEDICATION W/ASSISTANCE Description STG Manage Bowel with Medication with mod  Assistance.  Outcome: Progressing Flowsheets (Taken 06/12/2018 1155) STG: Pt will manage bowels with medication with assistance: 4-Minimal assistance   Problem: RH SKIN INTEGRITY Goal: RH STG SKIN FREE OF INFECTION/BREAKDOWN Description Patient free from skin infection entire stay on rehab min assist  Outcome: Progressing   Problem: RH SAFETY Goal: RH STG ADHERE TO SAFETY PRECAUTIONS W/ASSISTANCE/DEVICE Description STG Adhere to Safety Precautions With  Mod Assistance/Device.  Outcome: Progressing Flowsheets (Taken 06/12/2018 1155) STG:Pt will adhere to safety precautions with assistance/device: 4-Minimal assistance   Problem: RH PAIN MANAGEMENT Goal: RH STG PAIN MANAGED AT OR BELOW PT'S PAIN GOAL Description Pain less than 4  Outcome: Progressing

## 2018-06-13 ENCOUNTER — Inpatient Hospital Stay (HOSPITAL_COMMUNITY): Payer: Medicare Other | Admitting: Physical Therapy

## 2018-06-13 ENCOUNTER — Inpatient Hospital Stay (HOSPITAL_COMMUNITY): Payer: Medicare Other | Admitting: Occupational Therapy

## 2018-06-13 LAB — GLUCOSE, CAPILLARY
Glucose-Capillary: 101 mg/dL — ABNORMAL HIGH (ref 70–99)
Glucose-Capillary: 136 mg/dL — ABNORMAL HIGH (ref 70–99)
Glucose-Capillary: 242 mg/dL — ABNORMAL HIGH (ref 70–99)
Glucose-Capillary: 299 mg/dL — ABNORMAL HIGH (ref 70–99)

## 2018-06-13 MED ORDER — SIMETHICONE 80 MG PO CHEW
80.0000 mg | CHEWABLE_TABLET | Freq: Four times a day (QID) | ORAL | Status: DC | PRN
  Administered 2018-06-13: 10:00:00 80 mg via ORAL
  Filled 2018-06-13: qty 1

## 2018-06-13 MED ORDER — PREDNISONE 20 MG PO TABS
40.0000 mg | ORAL_TABLET | Freq: Every day | ORAL | Status: DC
  Administered 2018-06-14 – 2018-06-15 (×2): 40 mg via ORAL
  Filled 2018-06-13 (×2): qty 2

## 2018-06-13 MED ORDER — DOCUSATE SODIUM 100 MG PO CAPS
200.0000 mg | ORAL_CAPSULE | Freq: Three times a day (TID) | ORAL | Status: DC
  Administered 2018-06-13 – 2018-06-17 (×11): 200 mg via ORAL
  Filled 2018-06-13 (×11): qty 2

## 2018-06-13 MED ORDER — LEVALBUTEROL HCL 1.25 MG/0.5ML IN NEBU
1.2500 mg | INHALATION_SOLUTION | Freq: Four times a day (QID) | RESPIRATORY_TRACT | Status: DC | PRN
  Administered 2018-06-14 – 2018-06-17 (×2): 1.25 mg via RESPIRATORY_TRACT
  Filled 2018-06-13 (×3): qty 0.5

## 2018-06-13 NOTE — Progress Notes (Signed)
Physical Therapy Session Note  Patient Details  Name: Richard Wang MRN: [id number] Date of Birth: 20-Jan-1946  Today's Date: 06/13/2018 PT Missed Time: 45 Minutes Missed Time Reason: Patient unwilling to participate;Patient fatigue  Pt refusing participation in therapy today, stating "I don't feel too good". Encouraged to get OOB and sit up in attempts at feeling better and more awake, pt continuing to decline any activity. Missed 45 min of skilled PT.   Dionne Knoop Melton Krebs 06/13/2018, 12:34 PM

## 2018-06-13 NOTE — Progress Notes (Signed)
Attempted to work with patient for 30 minute session this morning. He shook his head "no" as soon as PT entered room and states he did not sleep well, has been up and down due to not being able to have BM, and is having significant stomach discomfort. Unable to convince patient to participate even in modified session. Encouraged him to participate in therapies later in the day. He was left in bed with all needs met, bed alarm active.   Deniece Ree PT, DPT, CBIS  Supplemental Physical Therapist [facility]    Pager [phone] Acute Rehab Office [phone]

## 2018-06-13 NOTE — Progress Notes (Signed)
[facility] PHYSICAL MEDICINE & REHABILITATION PROGRESS NOTE  Subjective/Complaints: Patient seen sitting up in bed this morning.  He states he slept fairly overnight due to hard stools.  He states he does not feel well today.  He does note improvement in back pain with Lidoderm patch.  ROS: + Mild SOB.  Denies CP, N/V/D  Objective: Vital Signs: Blood pressure 111/68, pulse 89, temperature 98.9 F (37.2 C), resp. rate 16, height 5\' 10"  (1.778 m), weight 75.8 kg, SpO2 97 %. No results found. No results for input(s): WBC, HGB, HCT, PLT in the last 72 hours. No results for input(s): NA, K, CL, CO2, GLUCOSE, BUN, CREATININE, CALCIUM in the last 72 hours.  Physical Exam: BP 111/68 (BP Location: Right Arm)   Pulse 89   Temp 98.9 F (37.2 C)   Resp 16   Ht 5\' 10"  (1.778 m)   Wt 75.8 kg   SpO2 97%   BMI 23.98 kg/m  Constitutional: No distress . Vital signs reviewed. HENT: Normocephalic.  Atraumatic. Eyes: EOMI.  No discharge. Cardiovascular: No JVD. Respiratory: Normal effort.  + [place]. GI: Non-distended. Musc: No edema or tenderness in extremities. Neurological: Patient is alert  Follows commands.  Motor: Bilateral upper extremities: 4+/5 proximal distal Right lower extremity: Hip flexion, knee extension 3/5, ankle dorsiflexion 4-/5, stable Left lower extremity: Hip flexion, knee extension 3/5, ankle dorsiflexion 1/5, stable Skin: Color is chronically grayin face Psych: Flat  Assessment/Plan: 1. Functional deficits secondary to chronic respiratory failure with multiple medical complications, including cardiac and rheumatological which require 3+ hours per day of interdisciplinary therapy in a comprehensive inpatient rehab setting.  Physiatrist is providing close team supervision and 24 hour management of active medical problems listed below.  Physiatrist and rehab team continue to assess barriers to discharge/monitor patient progress toward functional and medical goals  Care  Tool:  Bathing    Body parts bathed by patient: Right arm, Left arm, Chest, Abdomen, Front perineal area, Right upper leg, Left upper leg, Face, Right lower leg, Left lower leg   Body parts bathed by helper: Buttocks     Bathing assist Assist Level: Contact Guard/Touching assist     Upper Body Dressing/Undressing Upper body dressing Upper body dressing/undressing activity did not occur (including orthotics): Refused What is the patient wearing?: Pull over shirt    Upper body assist Assist Level: Minimal Assistance - Patient > 75%    Lower Body Dressing/Undressing Lower body dressing    Lower body dressing activity did not occur: Refused What is the patient wearing?: Pants     Lower body assist Assist for lower body dressing: Minimal Assistance - Patient > 75%     Toileting Toileting    Toileting assist Assist for toileting: Moderate Assistance - Patient 50 - 74%     Transfers Chair/bed transfer  Transfers assist  Chair/bed transfer activity did not occur: Refused  Chair/bed transfer assist level: Contact Guard/Touching assist     Locomotion Ambulation   Ambulation assist   Ambulation activity did not occur: Safety/medical concerns  Assist level: Contact Guard/Touching assist Assistive device: Walker-rolling Max distance: 50'   Walk 10 feet activity   Assist  Walk 10 feet activity did not occur: Safety/medical concerns  Assist level: Contact Guard/Touching assist Assistive device: Walker-rolling   Walk 50 feet activity   Assist Walk 50 feet with 2 turns activity did not occur: Safety/medical concerns  Assist level: Contact Guard/Touching assist Assistive device: Walker-rolling    Walk 150 feet activity  Assist Walk 150 feet activity did not occur: Safety/medical concerns         Walk 10 feet on uneven surface  activity   Assist Walk 10 feet on uneven surfaces activity did not occur: Safety/medical concerns          Wheelchair     Assist Will patient use wheelchair at discharge?: Yes Type of Wheelchair: Manual Wheelchair activity did not occur: Safety/medical concerns  Wheelchair assist level: Supervision/Verbal cueing Max wheelchair distance: 150'    Wheelchair 50 feet with 2 turns activity    Assist    Wheelchair 50 feet with 2 turns activity did not occur: Safety/medical concerns   Assist Level: Supervision/Verbal cueing   Wheelchair 150 feet activity     Assist Wheelchair 150 feet activity did not occur: Safety/medical concerns   Assist Level: Supervision/Verbal cueing      Medical Problem List and Plan: 1.Debilitysecondary to acute on chronic respiratory failurewith hypoxemia and hypercarbia, CAD/CHF with history of colitis on Remicade.  Continue CIR   Prednisone decreased to 50 on 4/20, decreased to 40 on 4/24  Continue supplemental oxygen, will attempt to wean  Flutter valve ordered 2. Antithrombotics: -DVT/anticoagulation:Subcutaneous heparin -antiplatelet therapy: Aspirin 81 mg daily 3. Pain Management/Chronic back pain:Hydrocodone and Robaxin as needed  Needs regular encouragement  -kpad for chest wall, prn ice  -added levbid for GI cramping, DC'd on 4/22  Lidoderm patch added on 4/21 4. Mood:Provide emotional support  Klonopin 3 times daily started on 4/17, increased on 4/20 -antipsychotic agents: N/A 5. Neuropsych: This patientiscapable of making decisions on hisown behalf. 6. Skin/Wound Care:Routine skin checks 7. Fluids/Electrolytes/Nutrition:Routine in and outs 8.CAD with non-STEMI. Continue aspirin. Follow-up per cardiology services  Anxiety remains a contributing factor to chest pain, however objective findings present as well.   Repeat ECG on 4/18 reviewed, showing A. fib and prolonged QTC  Continue with cards recs 9.Atrial fibrillation. Diltiazem daily/amiodarone daily/hydralazine/metoprolol.    Medications being adjusted by Cards, appreciate recs  Heart rate relatively labile on 4/23 10.Hypertension.  See #9  Relatively controlled on 4/23  Monitor with increased mobility 11. Diastolic congestive heart failure. Monitor for any signs of fluid overload.Continue Lasix as directed  Daily weights ordered.  Filed Weights   06/11/18 0500 06/12/18 0600 06/13/18 0600  Weight: 75.7 kg 75.8 kg 75.8 kg   Stable on 4/23 12. Urinary retention  Urecholine 10 mg 3 times a day, Flomax 0.4 mg daily.    Klebsiella pneumo UTI. Completed course of Bactrim on 4/22 13. Hyperlipidemia. Lipitor 14.  Steroid-induced hyperglycemia with hx of DM. Latest hemoglobin A1c 5.0.   Patient on metformin 1000 mg twice a day prior to admissionbut held due to mildly elevated creatinine.   Presently on Lantus insulin 7 units daily at bedtime, with 5 mg daily.   Check blood sugars before meals and at bedtime. Diabetic teaching  Monitor with taper of prednisone.    Labile on 4/23 15. Crohn's colitis with history of GI bleed. Protonix 40 mg twice a day.Patientdid receive Remicade 06/05/2018 and is scheduled every 8 weeks  Need to be particularly cautious due to immunosuppression  MiraLAX daily ordered on 4/20  Colace 3 times daily ordered on 4/21, increased on 4/23 16. AKI/renal insufficiency. Strict in and out's.   Creatinine 2.17 on 4/20  Labs ordered for tomorrow  Will consider IVF if necessary  Encourage fluids 16. Tobacco abuse. Counseling 17. Acute on chronic anemia.   Hemoglobin 8.1 on 4/20  Labs ordered for tomorrow  18. Hyponatremia  Sodium 137 on 4/20, labs ordered for tomorrow 19.  Hyperkalemia: Resolved  Potassium 3.9 on 4/20 20.  Transaminitis: Resolved  LFTs within normal limits on 4/20 21.  Leukocytosis: Resolved  Afebrile    LOS: 7 days A FACE TO FACE EVALUATION WAS PERFORMED  Kathaleya Mcduffee Karis Juba 06/13/2018, 11:44 AM

## 2018-06-13 NOTE — Progress Notes (Signed)
Occupational Therapy Session Note  Patient Details  Name: Richard Wang MRN: [id number] Date of Birth: 11-13-1945  Today's Date: 06/13/2018 OT Individual Time: 4888-9169 OT Individual Time Calculation (min): 23 min     Skilled Therapeutic Interventions/Progress Updates:   patient lying on his left side upon approach for OT.  He stated, "I am not going to do anything today.   I have been so sick.   I finally went, but I have nausea and have not slept and feel horrible.  I just need to rest right now."     He was in a poor position bunched up on his side at the bottom and his bed, and stated, "I can never get this bed right."    This clinician reminded him to use his call bell and ask for staff help for repositioning and re education of using bed controls.  He did allow this clinician to assist him with repositioning in bed.  He required min A to complete bed repositioning.     He missed 52 minutes of his scheduled session due to complaints of feeling sick, fatigued, and nauseated.  He was left in the care of his nurse who was giving him pain meds when this clinician exited his room.     Therapy Documentation Precautions:  Precautions Precautions: Fall Precaution Comments: watch O2 and HR Restrictions Weight Bearing Restrictions: No General: General OT Amount of Missed Time: 52 Minutes(52)  Pain:RN gave meds Pain Assessment Pain Scale: 0-10 Pain Score: 4  Pain Type: Chronic pain Pain Location: Back Pain Descriptors / Indicators: Aching Pain Intervention(s): Repositioned    Therapy/Group: Individual Therapy  Rozelle Logan 06/13/2018, 9:13 PM

## 2018-06-13 NOTE — Progress Notes (Signed)
Occupational Therapy Note  Patient Details  Name: Richard Wang MRN: [id number] Date of Birth: 21-Apr-1945  Today's Date: 06/13/2018 OT Missed Time: 33 Minutes Missed Time Reason: Patient unwilling/refused to participate without medical reason;Patient fatigue  Patient greeted semi-reclined in bed asleep with all lights off. Pt easy to wake but states he is too fatigued to participate in therapy. Pt states he has been up to commode multiple times today and does not feel well. OT provided gently encouragement and therapeutic use of self to try to get pt to participate, but was unable to convince him. Pt left semi-reclined in bed with bed alarm on and needs met.    Daneen Schick Marilyn Nihiser 06/13/2018, 2:57 PM

## 2018-06-14 ENCOUNTER — Inpatient Hospital Stay (HOSPITAL_COMMUNITY): Payer: Medicare Other | Admitting: Occupational Therapy

## 2018-06-14 ENCOUNTER — Inpatient Hospital Stay (HOSPITAL_COMMUNITY): Payer: Medicare Other | Admitting: Physical Therapy

## 2018-06-14 LAB — CBC WITH DIFFERENTIAL/PLATELET
Abs Immature Granulocytes: 0.18 10*3/uL — ABNORMAL HIGH (ref 0.00–0.07)
Basophils Absolute: 0 10*3/uL (ref 0.0–0.1)
Basophils Relative: 0 %
Eosinophils Absolute: 0 10*3/uL (ref 0.0–0.5)
Eosinophils Relative: 0 %
HCT: 28.6 % — ABNORMAL LOW (ref 39.0–52.0)
Hemoglobin: 8.5 g/dL — ABNORMAL LOW (ref 13.0–17.0)
Immature Granulocytes: 2 %
Lymphocytes Relative: 9 %
Lymphs Abs: 1.1 10*3/uL (ref 0.7–4.0)
MCH: 25.3 pg — ABNORMAL LOW (ref 26.0–34.0)
MCHC: 29.7 g/dL — ABNORMAL LOW (ref 30.0–36.0)
MCV: 85.1 fL (ref 80.0–100.0)
Monocytes Absolute: 0.5 10*3/uL (ref 0.1–1.0)
Monocytes Relative: 5 %
Neutro Abs: 9.9 10*3/uL — ABNORMAL HIGH (ref 1.7–7.7)
Neutrophils Relative %: 84 %
Platelets: 222 10*3/uL (ref 150–400)
RBC: 3.36 MIL/uL — ABNORMAL LOW (ref 4.22–5.81)
RDW: 16.6 % — ABNORMAL HIGH (ref 11.5–15.5)
WBC: 11.7 10*3/uL — ABNORMAL HIGH (ref 4.0–10.5)
nRBC: 0 % (ref 0.0–0.2)

## 2018-06-14 LAB — BASIC METABOLIC PANEL
Anion gap: 12 (ref 5–15)
BUN: 51 mg/dL — ABNORMAL HIGH (ref 8–23)
CO2: 22 mmol/L (ref 22–32)
Calcium: 9.1 mg/dL (ref 8.9–10.3)
Chloride: 106 mmol/L (ref 98–111)
Creatinine, Ser: 1.71 mg/dL — ABNORMAL HIGH (ref 0.61–1.24)
GFR calc Af Amer: 46 mL/min — ABNORMAL LOW (ref >60)
GFR calc non Af Amer: 39 mL/min — ABNORMAL LOW (ref >60)
Glucose, Bld: 110 mg/dL — ABNORMAL HIGH (ref 70–99)
Potassium: 4.3 mmol/L (ref 3.5–5.1)
Sodium: 140 mmol/L (ref 135–145)

## 2018-06-14 LAB — GLUCOSE, CAPILLARY
Glucose-Capillary: 103 mg/dL — ABNORMAL HIGH (ref 70–99)
Glucose-Capillary: 173 mg/dL — ABNORMAL HIGH (ref 70–99)
Glucose-Capillary: 252 mg/dL — ABNORMAL HIGH (ref 70–99)
Glucose-Capillary: 316 mg/dL — ABNORMAL HIGH (ref 70–99)

## 2018-06-14 NOTE — Progress Notes (Signed)
RT Note:  Was called by patient RN, RN stated that patient was feeling SOB and that she placed patient back on [place] 1L.  When I came into room, patient stated he did not know why he was SOB, he said that it has been happening lately.  Sats were 100% on 1L of O2.  BS were clear and diminished with no wheezing or rhonchi present.  I asked patient if he thought he was anxious, he stated he did not know what the problem was, but said that he had the O2 off for awhile and then it started and he had to put it back on, he said it has been happening like that.  Gave PRN treatment.  Will continue to monitor.

## 2018-06-14 NOTE — Patient Care Conference (Signed)
Inpatient RehabilitationTeam Conference and Plan of Care Update Date: 06/12/2018   Time: 2:50 PM    Patient Name: Richard Wang      Medical Record Number: [id number]  Date of Birth: [birth year] Sex: Male         Room/Bed: 4M06C/4M06C-01 Payor Info: Payor: MEDICARE / Plan: MEDICARE PART A AND B / Product Type: *No Product type* /    Admitting Diagnosis: Debility  Admit Date/Time:  06/06/2018  2:17 PM Admission Comments: No comment available   Primary Diagnosis:  <principal problem not specified> Principal Problem: <principal problem not specified>  Patient Active Problem List   Diagnosis Date Noted  . Supplemental oxygen dependent   . UTI due to Klebsiella species   . Transaminitis   . Hyperkalemia   . Hyponatremia   . Acute on chronic anemia   . Diabetes mellitus type 2 in nonobese (HCC)   . Acute combined systolic and diastolic congestive heart failure (HCC)   . Coronary artery disease involving native coronary artery of native heart with unstable angina pectoris (HCC)   . Chronic pain syndrome   . Anxiety state   . Diabetes mellitus type 2, noninsulin dependent (HCC)   . FTT (failure to thrive) in adult   . AKI (acute kidney injury) (HCC) 06/03/2018  . 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 Crohn's disease (HCC) 06/03/2018  . Atrial fibrillation with RVR (HCC) 06/02/2018  . Shortness of breath   . Hypokalemia   . Acute blood loss anemia   . Labile blood glucose   . Urinary retention   . Chronic respiratory failure (HCC)   . Atrial fibrillation (HCC)   . Labile blood pressure   . Essential hypertension   . Steroid-induced hyperglycemia   . Normocytic anemia   . Acute on chronic respiratory failure (HCC)   . Leucocytosis   . Hypoalbuminemia due to protein-calorie malnutrition (HCC)   . Urinary frequency   . Debility 05/17/2018  . Acute and chronic respiratory failure with hypoxia (HCC)   . Acute systolic heart failure (HCC)   . Chronic atrial fibrillation   .  Acute myocardial infarction, subendocardial infarction, subsequent episode of care (HCC)   . Unspecified septicemia(038.9) (HCC)   . Sepsis with acute organ dysfunction (HCC)   . Acute and chronic respiratory failure with hypoxia [facility])     Expected Discharge Date: Expected Discharge Date: 06/17/18  Team Members Present: Physician leading conference: Dr. Maryla Morrow Social Worker Present: Amada Jupiter, LCSW Nurse Present: Adora Fridge, RN PT Present: Carlynn Purl, PT OT Present: Blanch Media, OT SLP Present: Feliberto Gottron, SLP PPS Coordinator present : Fae Pippin     Current Status/Progress Goal Weekly Team Focus  Medical   Debility secondary to acute on chronic respiratory failure with hypoxemia and hypercarbia, CAD/CHF with history of colitis on Remicade.  Improve mobility, SOB, anxiety, back pain, AKI, UTI, Crohns  See above   Bowel/Bladder   continent of bowel & bladder, LBM 06/10/18  Maintain regular bowel pattern.  assist as needed   Swallow/Nutrition/ Hydration             ADL's   min A overall  Supervision  activity tolerance, general strengthening, sit<>stands, transfers, activity tolerance   Mobility   CGA-min asssist sit<>stands, CGA-supervision once in standing  supervision overall  LE strength, functional strength, discharge planning    Communication             Safety/Cognition/ Behavioral Observations  Pain   c/o pain to back & abdomen, has norco prn  pain scale <4/10  assess & treat as needed   Skin   bruising to abdoemn & bil arms, MASD groin  no new skin breakdown  assess q shift    Rehab Goals Patient on target to meet rehab goals: Yes *See Care Plan and progress notes for long and short-term goals.     Barriers to Discharge  Current Status/Progress Possible Resolutions Date Resolved   Physician    Medical stability;Other (comments);Behavior;New oxygen  Chronic pain  See above  Therapies, follow labs, abx, bowel meds, encourage  fluids, Cards recs, bladder meds, optimize DM/HTN meds      Nursing                  PT  Medical stability;Decreased caregiver support  Caregivers only able to provide supervision level assist              OT                  SLP                SW                Discharge Planning/Teaching Needs:  Plan to return home with sister and her family including a local neice who can provide 24/7 supervision.  Teaching needs to be determined closer to d/c.   Team Discussion:  Pt c/o CP and SOB still, however, cards note he is stable overall.  ?anxiety contiues.  UTI treated.  Cont b/b.  Received breathing treatments.CG- min assist sit -stands;  amb ~ 53' with rw and goals supervision.  Min assist with OT and supervision goals. Better participation past couple of days.  Revisions to Treatment Plan:  NA    Continued Need for Acute Rehabilitation Level of Care: The patient requires daily medical management by a physician with specialized training in physical medicine and rehabilitation for the following conditions: Daily direction of a multidisciplinary physical rehabilitation program to ensure safe treatment while eliciting the highest outcome that is of practical value to the patient.: Yes Daily medical management of patient stability for increased activity during participation in an intensive rehabilitation regime.: Yes Daily analysis of laboratory values and/or radiology reports with any subsequent need for medication adjustment of medical intervention for : Pulmonary problems;Other;Urological problems;Cardiac problems;Renal problems;Mood/behavior problems   I attest that I was present, lead the team conference, and concur with the assessment and plan of the team.   Amada Jupiter 06/13/2018, 3:06 PM    Team conference was held via web/ teleconference due to COVID - 19

## 2018-06-14 NOTE — Progress Notes (Signed)
Occupational Therapy Weekly Progress Note  Patient Details  Name: Richard Wang MRN: [id number] Date of Birth: 10/18/1945  Beginning of progress report period: June 07, 2018 End of progress report period: June 14, 2018  Today's Date: 06/14/2018  Session 1 OT Individual Time: 0820-0905 OT Individual Time Calculation (min): 45 min   Session  OT Individual Time: 1430-1530 OT Individual Time Calculation (min): 60 min   Patient was able to meet 3/3 OT goals this week despite pt missing therapy 2/2 fatigue, stomach and back pain, and nausea. When pt participates, he is able to complete BADL tasks at an overall min A level. Pt continues to have very poor endurance and activity tolerance within BADL tasks requiring sitting for most activities.   Patient continues to demonstrate the following deficits: muscle weakness, decreased cardiorespiratoy endurance and decreased oxygen support and decreased standing balance and decreased balance strategies and therefore will continue to benefit from skilled OT intervention to enhance overall performance with BADL.  Patient progressing toward long term goals..  Continue plan of care.  OT Short Term Goals Week 1:  OT Short Term Goal 1 (Week 1): Pt will tolerate sitting EOB for 5 mins in preparation for BADL task OT Short Term Goal 1 - Progress (Week 1): Not met OT Short Term Goal 2 (Week 1): Pt will complete sit<>stand with mod A of 1 OT Short Term Goal 2 - Progress (Week 1): Met OT Short Term Goal 3 (Week 1): Pt will complete 1 step of LB dressing OT Short Term Goal 3 - Progress (Week 1): Met Week 2:  OT Short Term Goal 1 (Week 2): STG=LTG 2/2 ELOS  Skilled Therapeutic Interventions/Progress Updates:    Pt greeted semi-reclined in bed, reports feeling much better today and agreeable to OT treatment session. Pt requests bathing/dressing to be completed at later session. Pt on 2L of O2 at rest and SpO2 at 100%. Monitored O2 sats throughout session as  described below. Pt came to sitting EOB with supervision. Sit<>stands with min A. Tolerated standing for 3 minutes to check O2. Took seated rest break, then ambulated 7 feet to wc with RW and CGA. Pt completed grooming tasks at the sink sitting. PT agreeable to stay up in wc for at least 30 minutes. Pt left in wc with alarm belt on and needs met.   Supine at rest on room air: SpO2 98% HR 80 Sitting EOB on room air :  SpO2 97% HR 85 Standing at EOB on 2L with deep breathing: SpO2 98% HR 110 Ambulating in room on 2L: SpO2 95% HR 112  Session 2 Pt greeted semi-reclined in bed and argeeable to OT. SpO2 100% on 1L of O2. OT removed O2 and pt maintained 100% on room air. OT discussed DME needs and home bathroom set-up with pt. Pt reports he does not think a tub transfer bench with fit in the space between tub and commode-plan to have home health OT assess tub transfer and pt agreeable to sponge baths until home health comes. Pt came to sitting EOB and completed sit<>stand from EOB w/RW and CGA. Pt then ambulated to bathroom CGA and RW. Pt doffed clothing with min verbal cues and CGA for balance when removing pants. SpO2 maintained at 95-100% throughout session on room air. Bathing completed with overall set-up A/supervision. Stand-pivot out of shower with CGA and use of grab bars. Dressing completed sit<>stand from the sink with increased time and rest breaks with supervision overall and only CGA when  standing to pull up pants. Pt agreeable to stay up in wc for a while and left seated in wc with alarm belt on and needs met.   Therapy Documentation Precautions:  Precautions Precautions: Fall Precaution Comments: watch O2 and HR Restrictions Weight Bearing Restrictions: No General: General PT Missed Treatment Reason: Toileting Vital Signs: Therapy Vitals Temp: 98 F (36.7 C) Temp Source: Oral Pulse Rate: 64 Resp: 16 BP: 123/62 Patient Position (if appropriate): Lying Oxygen Therapy SpO2: 100  % O2 Device: Nasal Cannula O2 Flow Rate (L/min): 1 L/min Pain: Pain Assessment Pain Scale: 0-10 Pain Score: 2  Pain Type: Chronic pain Pain Location: Back Pain Orientation: Mid;Lower Pain Descriptors / Indicators: Aching Pain Frequency: Constant Pain Onset: On-going Pain Intervention(s): Repositioned ADL: ADL Eating: (UTA) Grooming: Setup(washed face in bed with set-up ) Upper Body Bathing: (UTA) ADL Comments: UTA BADLs 2/2 SOB and pt declining to participate   Therapy/Group: Individual Therapy  Valma Cava 06/14/2018, 3:41 PM

## 2018-06-14 NOTE — Plan of Care (Signed)
  Problem: Consults Goal: RH GENERAL PATIENT EDUCATION Description See Patient Education module for education specifics.mod assist  Outcome: Progressing Goal: Skin Care Protocol Initiated - if Braden Score 18 or less Description If consults are not indicated, leave blank or document N/A; mod assistance  Outcome: Progressing Goal: Nutrition Consult-if indicated Description Mod assistance  Outcome: Progressing Goal: Diabetes Guidelines if Diabetic/Glucose > 140 Description If diabetic or lab glucose is > 140 mg/dl - Initiate Diabetes/Hyperglycemia Guidelines & Document Interventions mod assistance  Outcome: Progressing   Problem: RH BOWEL ELIMINATION Goal: RH STG MANAGE BOWEL WITH ASSISTANCE Description STG Manage Bowel with Assistance.mod assistance  Outcome: Progressing Goal: RH STG MANAGE BOWEL W/MEDICATION W/ASSISTANCE Description STG Manage Bowel with Medication with mod  Assistance.  Outcome: Progressing   Problem: RH SKIN INTEGRITY Goal: RH STG SKIN FREE OF INFECTION/BREAKDOWN Description Patient free from skin infection entire stay on rehab min assist  Outcome: Progressing   Problem: RH SAFETY Goal: RH STG ADHERE TO SAFETY PRECAUTIONS W/ASSISTANCE/DEVICE Description STG Adhere to Safety Precautions With  Mod Assistance/Device.  Outcome: Progressing   Problem: RH PAIN MANAGEMENT Goal: RH STG PAIN MANAGED AT OR BELOW PT'S PAIN GOAL Description Pain less than 4  Outcome: Progressing   

## 2018-06-14 NOTE — Progress Notes (Signed)
O2 titrated down to 1L/min this morning starting around 0815. Pt stayed on 1L/min  Until 1430 and O2 sats stayed between 96-100%. O2 turned off at 1430. Pt completed OT session without supplemental O2. According to OT, O2 sat stayed between 97-100. Pt is currently in bed on room air sating 97-100%. No distress present at this time. Continue plan of care.   Yousif Edelson W Saralynn Langhorst

## 2018-06-14 NOTE — Progress Notes (Signed)
Physical Therapy Weekly Progress Note  Patient Details  Name: Richard Wang MRN: [id number] Date of Birth: 12-Mar-1945  Beginning of progress report period: June 07, 2018 End of progress report period: June 14, 2018  Today's Date: 06/14/2018 PT Individual Time: 1027-2536 PT Individual Time Calculation (min): 60 min   Patient has met 2 of 3 short term goals. Pt is making progress towards LTGs despite intermittent bouts of chest pain and abnormal vital sign response to activity, fatigue, and nausea/constipation. He has returned to the functional level he was at prior to d/c back to acute care services, except w/ more impaired endurance and requirement for supplemental O2. He is supervision for all mobility except min assist needed to boost into standing from surfaces w/o armrests. Anticipate this being a functional deficit 2/2 LE weakness for some time, and thus have performed slide board transfers to/from surfaces w/o armrests to boost w/ supervision. Pt doesn't have family members to physically assist 24/7. He remains a limited household ambulator, ambulating up to 39' w/ RW and is self-propelling w/c w/ supervision using BUEs. He continues to demonstrate self-limiting behavior and has poor insight to the functional gains he has made.   Patient continues to demonstrate the following deficits muscle weakness and muscle paralysis, decreased cardiorespiratoy endurance and decreased oxygen support and decreased standing balance and decreased balance strategies and therefore will continue to benefit from skilled PT intervention to increase functional independence with mobility. Focusing on increasing endurance and decreasing supplemental O2 requirement.   Patient progressing toward long term goals..  Continue plan of care.   PT Short Term Goals Week 1:  PT Short Term Goal 1 (Week 1): Pt will tolerate 30 min of upright sitting w/o increase in fatigue PT Short Term Goal 1 - Progress (Week 1): Not  met PT Short Term Goal 2 (Week 1): Pt will ambulate 50' w/ LRAD w/ min assist PT Short Term Goal 2 - Progress (Week 1): Met PT Short Term Goal 3 (Week 1): Pt will transfer bed<>chair w/ min assist PT Short Term Goal 3 - Progress (Week 1): Met Week 2:  PT Short Term Goal 1 (Week 2): =LTGs due to ELOS  Skilled Therapeutic Interventions/Progress Updates:   Pt in supine and agreeable to therapy, no c/o pain. Supervision bed mobility and total assist to don shoes for time management. Sit>stand from bed surface w/ min assist and supervision for stand pivot transfer to w/c. Pt self-propelled w/c to/from therapy gym and around unit w/ supervision using BUEs. Session focused on problem solving home set-up and discharge planning. Educated pt on his functional gains, although he remains self-limiting and states "well if you guys say so, I'd hoped I'd be better by now". Educated pt on effects of prolonged immobilization in light of his medical complications and realistic expectations for recovery. Discussed that he is almost at goal level and would require months of recovery and time to build his strength and endurance back up. Pt verbalized understanding of all education. Practiced slide board transfers to/from mat for pt to perform when he is transferring from a surface w/o arm rests such as car or bed. He continues to require min assist to boost from those surfaces and does not have anyone that can provide physical assistance. Also practiced car transfer at height of pt's niece's mini-van. CGA-supervision and discussed use of slide board for lower height cars. Pt reporting he needed to urgently toilet. Returned to room via w/c and performed transfer to [facility] w/ CGA.  Ended session in [facility] and call bell in reach, missed 15 min of skilled PT 2/2 toileting. Supplemental O2 remainded on 1 L/min throughout session, O2 sat >95% and mild increase in work of breathing w/ all activity that resolves w/ rest.   Therapy  Documentation Precautions:  Precautions Precautions: Fall Precaution Comments: watch O2 and HR Restrictions Weight Bearing Restrictions: No Vital Signs:   Therapy/Group: Individual Therapy  Minha Fulco Clent Demark 06/14/2018, 12:39 PM

## 2018-06-14 NOTE — Progress Notes (Signed)
  Patient ID: Richard Wang, male   DOB: May 20, 1945, 73 y.o.   MRN: [id number]      Diagnosis codes:  R53.81;  J96.11;  I50.43  Height: 5'8"         Weight:  166 lbs          Patient suffers from debility, respiratory failure, COPD and CHF   which impairs their ability to perform daily activities like toileting, dressing and mobility  in the home.  A walker  will not resolve issue with performing activities of daily living.  A wheelchair will allow patient to safely perform daily activities.  Patient is not able to propel themselves in the home using a standard weight wheelchair due to significant debility and weakness .  Patient can self propel in the lightweight wheelchair.  Mariam Dollar, PA-C

## 2018-06-14 NOTE — Progress Notes (Signed)
[place] PHYSICAL MEDICINE & REHABILITATION PROGRESS NOTE  Subjective/Complaints: Patient seen sitting up in bed this morning.  He states he slept well overnight.  He states he feels better.  He notes bowel movements are baseline.  Discussed supplemental oxygen with nursing- appears to be predominantly anxiety related.  He notes significant benefit with Lidoderm patch to his back.  ROS: + Mild shortness of breath.  Denies CP, N/V/D  Objective: Vital Signs: Blood pressure (!) 122/91, pulse 82, temperature 97.8 F (36.6 C), resp. rate 17, height 5\' 10"  (1.778 m), weight 75.5 kg, SpO2 100 %. No results found. Recent Labs    06/14/18 0602  WBC 11.7*  HGB 8.5*  HCT 28.6*  PLT 222   Recent Labs    06/14/18 0602  NA 140  K 4.3  CL 106  CO2 22  GLUCOSE 110*  BUN 51*  CREATININE 1.71*  CALCIUM 9.1    Physical Exam: BP (!) 122/91 (BP Location: Right Arm)   Pulse 82   Temp 97.8 F (36.6 C)   Resp 17   Ht 5\' 10"  (1.778 m)   Wt 75.5 kg   SpO2 100%   BMI 23.88 kg/m  Constitutional: No distress . Vital signs reviewed. HENT: Normocephalic.  Atraumatic. Eyes: EOMI.  No discharge. Cardiovascular: No JVD. Respiratory: Normal effort.  + [place]. GI: Non-distended. Musc: No edema or tenderness in extremities. Neurological: Patient is alert  Follows commands.  Motor: Bilateral upper extremities: 4+/5 proximal distal Right lower extremity: Hip flexion, knee extension 3/5, ankle dorsiflexion 4-/5, unchanged Left lower extremity: Hip flexion, knee extension 3/5, ankle dorsiflexion 1/5, unchanged Skin: Color is chronically grayin face Psych: Flat  Assessment/Plan: 1. Functional deficits secondary to chronic respiratory failure with multiple medical complications, including cardiac and rheumatological which require 3+ hours per day of interdisciplinary therapy in a comprehensive inpatient rehab setting.  Physiatrist is providing close team supervision and 24 hour management of  active medical problems listed below.  Physiatrist and rehab team continue to assess barriers to discharge/monitor patient progress toward functional and medical goals  Care Tool:  Bathing    Body parts bathed by patient: Right arm, Left arm, Chest, Abdomen, Front perineal area, Right upper leg, Left upper leg, Face, Right lower leg, Left lower leg   Body parts bathed by helper: Buttocks     Bathing assist Assist Level: Contact Guard/Touching assist     Upper Body Dressing/Undressing Upper body dressing Upper body dressing/undressing activity did not occur (including orthotics): Refused What is the patient wearing?: Pull over shirt    Upper body assist Assist Level: Minimal Assistance - Patient > 75%    Lower Body Dressing/Undressing Lower body dressing    Lower body dressing activity did not occur: Refused What is the patient wearing?: Pants     Lower body assist Assist for lower body dressing: Minimal Assistance - Patient > 75%     Toileting Toileting    Toileting assist Assist for toileting: Moderate Assistance - Patient 50 - 74%     Transfers Chair/bed transfer  Transfers assist  Chair/bed transfer activity did not occur: Refused  Chair/bed transfer assist level: Contact Guard/Touching assist     Locomotion Ambulation   Ambulation assist   Ambulation activity did not occur: Safety/medical concerns  Assist level: Contact Guard/Touching assist Assistive device: Walker-rolling Max distance: 50'   Walk 10 feet activity   Assist  Walk 10 feet activity did not occur: Safety/medical concerns  Assist level: Contact Guard/Touching assist Assistive device:  Walker-rolling   Walk 50 feet activity   Assist Walk 50 feet with 2 turns activity did not occur: Safety/medical concerns  Assist level: Contact Guard/Touching assist Assistive device: Walker-rolling    Walk 150 feet activity   Assist Walk 150 feet activity did not occur: Safety/medical  concerns         Walk 10 feet on uneven surface  activity   Assist Walk 10 feet on uneven surfaces activity did not occur: Safety/medical concerns         Wheelchair     Assist Will patient use wheelchair at discharge?: Yes Type of Wheelchair: Manual Wheelchair activity did not occur: Safety/medical concerns  Wheelchair assist level: Supervision/Verbal cueing Max wheelchair distance: 150'    Wheelchair 50 feet with 2 turns activity    Assist    Wheelchair 50 feet with 2 turns activity did not occur: Safety/medical concerns   Assist Level: Supervision/Verbal cueing   Wheelchair 150 feet activity     Assist Wheelchair 150 feet activity did not occur: Safety/medical concerns   Assist Level: Supervision/Verbal cueing      Medical Problem List and Plan: 1.Debilitysecondary to acute on chronic respiratory failurewith hypoxemia and hypercarbia, CAD/CHF with history of colitis on Remicade.  Continue CIR   Prednisone decreased to 50 on 4/20, decreased to 40 on 4/24  Continue supplemental oxygen, wean as tolerated, discussed documentation  Flutter valve ordered  Plan for d/c on Monday  Will see patient for transitional care management in 1-2 weeks post-discharge 2. Antithrombotics: -DVT/anticoagulation:Subcutaneous heparin -antiplatelet therapy: Aspirin 81 mg daily 3. Pain Management/Chronic back pain:Hydrocodone and Robaxin as needed  Needs regular encouragement  -kpad for chest wall, prn ice  -added levbid for GI cramping, DC'd on 4/22  Lidoderm patch added on 4/21 with significant benefit 4. Mood:Provide emotional support  Klonopin 3 times daily started on 4/17, increased on 4/20 -antipsychotic agents: N/A 5. Neuropsych: This patientiscapable of making decisions on hisown behalf. 6. Skin/Wound Care:Routine skin checks 7. Fluids/Electrolytes/Nutrition:Routine in and outs 8.CAD with non-STEMI. Continue aspirin.  Follow-up per cardiology services  Anxiety remains a contributing factor to chest pain, however objective findings present as well.   Repeat ECG on 4/18 reviewed, showing A. fib and prolonged QTC  Continue with cards recs  Improved 9.Atrial fibrillation. Diltiazem daily/amiodarone daily/hydralazine/metoprolol.   Medications being adjusted by Cards, appreciate recs  Heart rate controlled on 4/24 10.Hypertension.  See #9  Controlled on 4/24  Monitor with increased mobility 11. Diastolic congestive heart failure. Monitor for any signs of fluid overload.Continue Lasix as directed  Daily weights ordered.  Filed Weights   06/12/18 0600 06/13/18 0600 06/14/18 0457  Weight: 75.8 kg 75.8 kg 75.5 kg   Stable on 4/24 12. Urinary retention  Urecholine 10 mg 3 times a day, Flomax 0.4 mg daily.    Klebsiella pneumo UTI. Completed course of Bactrim on 4/22 13. Hyperlipidemia. Lipitor 14.  Steroid-induced hyperglycemia with hx of DM. Latest hemoglobin A1c 5.0.   Patient on metformin 1000 mg twice a day prior to admissionbut held due to mildly elevated creatinine.   Presently on Lantus insulin 7 units daily at bedtime, with 5 mg daily.   Check blood sugars before meals and at bedtime. Diabetic teaching  Monitor with taper of prednisone.    Labile on 4/24 15. Crohn's colitis with history of GI bleed. Protonix 40 mg twice a day.Patientdid receive Remicade 06/05/2018 and is scheduled every 8 weeks  Need to be particularly cautious due to immunosuppression  MiraLAX  daily ordered on 4/20  Colace 3 times daily ordered on 4/21, increased on 4/23  Now at baseline 16. AKI/renal insufficiency. Strict in and out's.   Creatinine 1.71 on 4/24  Will consider IVF if necessary  Encourage fluids 16. Tobacco abuse. Counseling 17. Acute on chronic anemia.   Hemoglobin 8.5 on 4/24 18. Hyponatremia: Resolved  Sodium 140 on 4/24 19.  Hyperkalemia: Resolved  Potassium 3.9 on 4/20 20.  Transaminitis:  Resolved  LFTs within normal limits on 4/20 21.  Leukocytosis: Likely steroid-induced  Afebrile  WBC 11.7 on 4/24    LOS: 8 days A FACE TO FACE EVALUATION WAS PERFORMED  Ankit Karis Juba 06/14/2018, 9:23 AM

## 2018-06-14 NOTE — Progress Notes (Signed)
Social Work Patient ID: Richard Wang, male   DOB: Nov 01, 1945, 73 y.o.   MRN: [id number]  Have discussed pt's overall d/c readiness with MD and tx team today.  All feel we can still aim for d/c on Monday after family education is completed.  Niece, Duwayne Heck, to be here by 9am for ed.    Pier Bosher, LCSW

## 2018-06-15 ENCOUNTER — Inpatient Hospital Stay (HOSPITAL_COMMUNITY): Payer: Medicare Other

## 2018-06-15 LAB — GLUCOSE, CAPILLARY
Glucose-Capillary: 94 mg/dL (ref 70–99)
Glucose-Capillary: 242 mg/dL — ABNORMAL HIGH (ref 70–99)
Glucose-Capillary: 251 mg/dL — ABNORMAL HIGH (ref 70–99)
Glucose-Capillary: 296 mg/dL — ABNORMAL HIGH (ref 70–99)

## 2018-06-15 NOTE — Progress Notes (Signed)
[place] PHYSICAL MEDICINE & REHABILITATION PROGRESS NOTE  Subjective/Complaints:  Per nsg pt feels SOB on 1L O2 although sats are ok Does not have IS Currently feels a little SOB, no CP , no cough or congestion  ROS: + Mild shortness of breath.  Denies CP, N/V/D  Objective: Vital Signs: Blood pressure 133/83, pulse 93, temperature 97.7 F (36.5 C), temperature source Oral, resp. rate 16, height 5\' 10"  (1.778 m), weight 74.7 kg, SpO2 98 %. No results found. Recent Labs    06/14/18 0602  WBC 11.7*  HGB 8.5*  HCT 28.6*  PLT 222   Recent Labs    06/14/18 0602  NA 140  K 4.3  CL 106  CO2 22  GLUCOSE 110*  BUN 51*  CREATININE 1.71*  CALCIUM 9.1    Physical Exam: BP 133/83 (BP Location: Right Arm)   Pulse 93   Temp 97.7 F (36.5 C) (Oral)   Resp 16   Ht 5\' 10"  (1.778 m)   Wt 74.7 kg   SpO2 98%   BMI 23.63 kg/m  Constitutional: No distress . Vital signs reviewed. HENT: Normocephalic.  Atraumatic. Eyes: EOMI.  No discharge. Cardiovascular:IRREG no murmur  No JVD. Respiratory: Normal effort.  Lungs clear no wheezing  GI: Non-distended. Musc: No edema or tenderness in extremities. Neurological: Patient is alert  Follows commands.  Motor: Bilateral upper extremities: 4+/5 proximal distal Right lower extremity: Hip flexion, knee extension 3/5, ankle dorsiflexion 4-/5, unchanged Left lower extremity: Hip flexion, knee extension 3/5, ankle dorsiflexion 1/5, unchanged Skin: Color is chronically grayin face Psych: Flat  Assessment/Plan: 1. Functional deficits secondary to chronic respiratory failure with multiple medical complications, including cardiac and rheumatological which require 3+ hours per day of interdisciplinary therapy in a comprehensive inpatient rehab setting.  Physiatrist is providing close team supervision and 24 hour management of active medical problems listed below.  Physiatrist and rehab team continue to assess barriers to  discharge/monitor patient progress toward functional and medical goals  Care Tool:  Bathing    Body parts bathed by patient: Right arm, Left arm, Chest, Abdomen, Front perineal area, Right upper leg, Left upper leg, Face, Right lower leg, Left lower leg, Buttocks   Body parts bathed by helper: Buttocks     Bathing assist Assist Level: Contact Guard/Touching assist     Upper Body Dressing/Undressing Upper body dressing Upper body dressing/undressing activity did not occur (including orthotics): Refused What is the patient wearing?: Pull over shirt    Upper body assist Assist Level: Set up assist    Lower Body Dressing/Undressing Lower body dressing    Lower body dressing activity did not occur: Refused What is the patient wearing?: Underwear/pull up, Pants     Lower body assist Assist for lower body dressing: Contact Guard/Touching assist     Toileting Toileting    Toileting assist Assist for toileting: Minimal Assistance - Patient > 75%     Transfers Chair/bed transfer  Transfers assist  Chair/bed transfer activity did not occur: Refused  Chair/bed transfer assist level: Supervision/Verbal cueing(slideboard)     Locomotion Ambulation   Ambulation assist   Ambulation activity did not occur: Safety/medical concerns  Assist level: Contact Guard/Touching assist Assistive device: Walker-rolling Max distance: 50'   Walk 10 feet activity   Assist  Walk 10 feet activity did not occur: Safety/medical concerns  Assist level: Contact Guard/Touching assist Assistive device: Walker-rolling   Walk 50 feet activity   Assist Walk 50 feet with 2 turns activity did not  occur: Safety/medical concerns  Assist level: Contact Guard/Touching assist Assistive device: Walker-rolling    Walk 150 feet activity   Assist Walk 150 feet activity did not occur: Safety/medical concerns         Walk 10 feet on uneven surface  activity   Assist Walk 10 feet on  uneven surfaces activity did not occur: Safety/medical concerns         Wheelchair     Assist Will patient use wheelchair at discharge?: Yes Type of Wheelchair: Manual Wheelchair activity did not occur: Safety/medical concerns  Wheelchair assist level: Supervision/Verbal cueing Max wheelchair distance: 150'    Wheelchair 50 feet with 2 turns activity    Assist    Wheelchair 50 feet with 2 turns activity did not occur: Safety/medical concerns   Assist Level: Supervision/Verbal cueing   Wheelchair 150 feet activity     Assist Wheelchair 150 feet activity did not occur: Safety/medical concerns   Assist Level: Supervision/Verbal cueing      Medical Problem List and Plan: 1.Debilitysecondary to acute on chronic respiratory failurewith hypoxemia and hypercarbia, CAD/CHF with history of colitis on Remicade.  Continue CIR   Prednisone decreased to 50 on 4/20, decreased to 40 on 4/24  Continue supplemental oxygen, wean as tolerated, discussed documentation  Flutter valve ordered do not see Will order IS given no secretions  Plan for d/c on Monday, ? If he qualifies for O2 given normal sats  2. Antithrombotics: -DVT/anticoagulation:Subcutaneous heparin -antiplatelet therapy: Aspirin 81 mg daily 3. Pain Management/Chronic back pain:Hydrocodone and Robaxin as needed  Needs regular encouragement  -kpad for chest wall, prn ice  -added levbid for GI cramping, DC'd on 4/22  Lidoderm patch added on 4/21 with significant benefit 4. Mood:Provide emotional support  Klonopin 3 times daily started on 4/17, increased on 4/20 -antipsychotic agents: N/A 5. Neuropsych: This patientiscapable of making decisions on hisown behalf. 6. Skin/Wound Care:Routine skin checks 7. Fluids/Electrolytes/Nutrition:Routine in and outs 8.CAD with non-STEMI. Continue aspirin. Follow-up per cardiology services  No CP  Repeat ECG on 4/18 reviewed, showing  A. fib and prolonged QTC  Continue with cards recs  Improved 9.Atrial fibrillation. Diltiazem daily/amiodarone daily/hydralazine/metoprolol.   Medications being adjusted by Cards, appreciate recs  Heart rate controlled on 4/24 10.Hypertension.  See #9   Vitals:   06/15/18 0613 06/15/18 0634  BP:    Pulse: 93   Resp:    Temp:    SpO2: 98% 98%   11. Diastolic congestive heart failure. Monitor for any signs of fluid overload.Continue Lasix as directed  Daily weights ordered.  Filed Weights   06/13/18 0600 06/14/18 0457 06/15/18 0505  Weight: 75.8 kg 75.5 kg 74.7 kg   Stable on 4/24 12. Urinary retention  Urecholine 10 mg 3 times a day, Flomax 0.4 mg daily.    Klebsiella pneumo UTI. Completed course of Bactrim on 4/22 13. Hyperlipidemia. Lipitor 14.  Steroid-induced hyperglycemia with hx of DM. Latest hemoglobin A1c 5.0.   Patient on metformin 1000 mg twice a day prior to admissionbut held due to mildly elevated creatinine.   Presently on Lantus insulin 7 units daily at bedtime, with 5 mg daily.   Check blood sugars before meals and at bedtime. Diabetic teaching  Monitor with taper of prednisone.    Labile on 4/24 15. Crohn's colitis with history of GI bleed. Protonix 40 mg twice a day.Patientdid receive Remicade 06/05/2018 and is scheduled every 8 weeks  Need to be particularly cautious due to immunosuppression  MiraLAX daily ordered on  4/20  Colace 3 times daily ordered on 4/21, increased on 4/23  Now at baseline 16. AKI/renal insufficiency. Strict in and out's.   Creatinine 1.71 on 4/24  Will consider IVF if necessary  Encourage fluids 16. Tobacco abuse. Counseling 17. Acute on chronic anemia.   Hemoglobin 8.5 on 4/24 18. Hyponatremia: Resolved  Sodium 140 on 4/24 19.  Hyperkalemia: Resolved  Potassium 3.9 on 4/20 20.  Transaminitis: Resolved  LFTs within normal limits on 4/20 21.  Leukocytosis: Likely steroid-induced  Afebrile 4/25  WBC 11.7 on 4/24 22.   SOB likely anxiety induced , exam is normal sats normal afeb will check CXR as f/u last CXR 4/13 showing pulm edema Order IS may have atelectasis   LOS: 9 days A FACE TO FACE EVALUATION WAS PERFORMED  Erick Colace 06/15/2018, 7:22 AM

## 2018-06-15 NOTE — Plan of Care (Signed)
  Problem: Consults Goal: RH GENERAL PATIENT EDUCATION Description See Patient Education module for education specifics.mod assist  Outcome: Progressing Goal: Skin Care Protocol Initiated - if Braden Score 18 or less Description If consults are not indicated, leave blank or document N/A; mod assistance  Outcome: Progressing Goal: Nutrition Consult-if indicated Description Mod assistance  Outcome: Progressing Goal: Diabetes Guidelines if Diabetic/Glucose > 140 Description If diabetic or lab glucose is > 140 mg/dl - Initiate Diabetes/Hyperglycemia Guidelines & Document Interventions mod assistance  Outcome: Progressing   Problem: RH BOWEL ELIMINATION Goal: RH STG MANAGE BOWEL WITH ASSISTANCE Description STG Manage Bowel with Assistance.mod assistance  Outcome: Progressing Goal: RH STG MANAGE BOWEL W/MEDICATION W/ASSISTANCE Description STG Manage Bowel with Medication with mod  Assistance.  Outcome: Progressing   Problem: RH SKIN INTEGRITY Goal: RH STG SKIN FREE OF INFECTION/BREAKDOWN Description Patient free from skin infection entire stay on rehab min assist  Outcome: Progressing   Problem: RH SAFETY Goal: RH STG ADHERE TO SAFETY PRECAUTIONS W/ASSISTANCE/DEVICE Description STG Adhere to Safety Precautions With  Mod Assistance/Device.  Outcome: Progressing   Problem: RH PAIN MANAGEMENT Goal: RH STG PAIN MANAGED AT OR BELOW PT'S PAIN GOAL Description Pain less than 4  Outcome: Progressing   

## 2018-06-16 ENCOUNTER — Inpatient Hospital Stay (HOSPITAL_COMMUNITY): Payer: Medicare Other | Admitting: Occupational Therapy

## 2018-06-16 LAB — GLUCOSE, CAPILLARY
Glucose-Capillary: 97 mg/dL (ref 70–99)
Glucose-Capillary: 137 mg/dL — ABNORMAL HIGH (ref 70–99)
Glucose-Capillary: 166 mg/dL — ABNORMAL HIGH (ref 70–99)
Glucose-Capillary: 175 mg/dL — ABNORMAL HIGH (ref 70–99)

## 2018-06-16 MED ORDER — PREDNISONE 20 MG PO TABS
30.0000 mg | ORAL_TABLET | Freq: Every day | ORAL | Status: DC
  Administered 2018-06-17: 30 mg via ORAL
  Filled 2018-06-16: qty 1

## 2018-06-16 NOTE — Progress Notes (Signed)
Physical Therapy Discharge Summary  Patient Details  Name: Richard Wang MRN: [id number] Date of Birth: May 06, 1945  Today's Date: 06/16/2018 PT Individual Time: 0900-0953 PT Individual Time Calculation (min): 53 min   Pt in supine and agreeable to therapy, no c/o pain. Pt's niece present for education. Educated niece on providing supervision level assist for limited household mobility w/ RW and w/c. Discussed use of slide board to transfer to w/c from surfaces w/o UE support including bed and couch. Also discussed need to be propped up on pillows when sleeping as pt has been used to being propped in supine in hospital. Performed O2 assessment, pt @ 100% on RA at rest. Pt down to 66% on RA w/ gait for 15', immediately returned to 100% within a few seconds of sitting down. Ambulated 15' on 1 L/min, O2 @ 95% majority of time, down to 70% at very end of walk, returned to 100% when sitting. Moderate increase in work of breathing w/ all mobility, no SOB. Problem solved getting into niece's truck, used 6" step to back up w/ RW step on step and then sit down on truck seat, needed min assist overall at truck height, CGA at sedan height. Niece feels comfortable assisting in and out of truck. Returned to room via w/c. Returned to room and ended session in w/c, all needs in reach.   Patient has met 10 of 11 long term goals due to improved activity tolerance, improved balance, increased strength, ability to compensate for deficits and functional use of  right lower extremity and left lower extremity.  Patient to discharge at an ambulatory and transfer level Supervision. Pt remains a limited household ambulator 2/2 endurance deficits and chronic back pain, he will use manual w/c for energy conservation a portion of the time.  Patient's care partner is independent to provide the necessary physical assistance at discharge. Pt lives w/ sister and brother-in-law who are unable to provide physical assistance more than  supervision. Pt's niece is very involved in his medical affairs and has been trained on what supervision level assist requires and plans to relay that to other family members.   Reasons goals not met: Pt continues to require min assist to boost into standing from surfaces w/o arm rests 2/2 LE weakness. He plans to use the slide board for transfers from these surfaces as his family members are unable to provide more than supervision level assist.   Recommendation:  Patient will benefit from ongoing skilled PT services in home health setting to continue to advance safe functional mobility, address ongoing impairments in functional LE strength, functional balance, chronic back pain, endurance, and cardiovascular fitness, and minimize fall risk.  Equipment: 18x18 w/c, RW, slide board  Reasons for discharge: treatment goals met and discharge from hospital  Patient/family agrees with progress made and goals achieved: Yes  PT Discharge Precautions/Restrictions Precautions Precautions: Fall Restrictions Weight Bearing Restrictions: No Vision/Perception  Perception Perception: Within Functional Limits Praxis Praxis: Intact  Cognition Overall Cognitive Status: Within Functional Limits for tasks assessed Arousal/Alertness: Awake/alert Orientation Level: Oriented X4 Selective Attention: Appears intact Memory: Appears intact Awareness: Appears intact Problem Solving: Appears intact Safety/Judgment: Appears intact Comments: Anxiety at baseline Sensation Sensation Light Touch: Impaired Detail Central sensation comments: intact  Peripheral sensation comments: diminished in LEs Coordination Gross Motor Movements are Fluid and Coordinated: No Heel Shin Test: impaired bilaterally 2/2 weakness Motor  Motor Motor: Other (comment) Motor - Discharge Observations: generalized weakness, L foot drop   Mobility Bed Mobility Bed  Mobility: Rolling Left;Rolling Right Rolling Right: Independent  with assistive device Rolling Left: Independent with assistive device Supine to Sit: Independent with assistive device Sit to Supine: Independent with assistive device Transfers Transfers: Sit to Stand;Stand to Sit;Stand Pivot Transfers Sit to Stand: Minimal Assistance - Patient > 75%(supervision from surface w/ stable B armrests) Stand to Sit: Supervision/Verbal cueing Stand Pivot Transfers: Supervision/Verbal cueing(supervision once in stance) Transfer (Assistive device): Rolling walker Locomotion  Gait Ambulation: Yes Gait Assistance: Supervision/Verbal cueing Gait Distance (Feet): 50 Feet Assistive device: Rolling walker Gait Gait Pattern: Impaired Gait Pattern: Trunk flexed;Decreased dorsiflexion - left Stairs / Additional Locomotion Stairs: No Wheelchair Mobility Wheelchair Mobility: Yes Wheelchair Assistance: Chartered loss adjuster: Both upper extremities Wheelchair Parts Management: Supervision/cueing Distance: 150'  Trunk/Postural Assessment  Cervical Assessment Cervical Assessment: Exceptions to WFL(forward head) Thoracic Assessment Thoracic Assessment: Within Functional Limits Lumbar Assessment Lumbar Assessment: Exceptions to WFL(posterior pelvic tilt, chronic pain) Postural Control Postural Control: Within Functional Limits  Balance Balance Balance Assessed: Yes Static Sitting Balance Static Sitting - Balance Support: Feet supported;No upper extremity supported Static Sitting - Level of Assistance: 5: Stand by assistance Dynamic Sitting Balance Dynamic Sitting - Balance Support: Feet supported;No upper extremity supported Dynamic Sitting - Level of Assistance: 5: Stand by assistance Static Standing Balance Static Standing - Balance Support: Bilateral upper extremity supported;During functional activity Static Standing - Level of Assistance: 5: Stand by assistance Dynamic Standing Balance Dynamic Standing - Balance Support: During  functional activity;No upper extremity supported Dynamic Standing - Level of Assistance: 5: Stand by assistance(CGA) Extremity Assessment  RLE Assessment Passive Range of Motion (PROM) Comments: WFL, moderate gastroc tightness General Strength Comments: globally 3+ to 4-/5  LLE Assessment LLE Assessment: Exceptions to [facility] Passive Range of Motion (PROM) Comments: WFL, moderate gastroc tightness General Strength Comments: Globally 3+ to 4-/5, except 0/5 dorsiflexion    Xiamara Hulet K Jesseca Marsch 06/16/2018, 1:48 PM

## 2018-06-16 NOTE — Discharge Summary (Addendum)
Physician Discharge Summary  Patient ID: Richard Wang MRN: [id number] DOB/AGE: 1945/06/18 73 y.o.  Admit date: 06/06/2018 Discharge date: 06/17/2018  Discharge Diagnoses:  Active Problems:   Debility   Transaminitis   Hyperkalemia   Hyponatremia   Acute on chronic anemia   Diabetes mellitus type 2 in nonobese (HCC)   Acute combined systolic and diastolic congestive heart failure (HCC)   Coronary artery disease involving native coronary artery of native heart with unstable angina pectoris (HCC)   Chronic pain syndrome   Anxiety state   UTI due to Klebsiella species   Supplemental oxygen dependent   Discharged Condition: Stable  Significant Diagnostic Studies: Dg Chest 2 View  Result Date: 06/15/2018 CLINICAL DATA:  Shortness of breath and history of pulmonary edema and respiratory failure. EXAM: CHEST - 2 VIEW COMPARISON:  06/03/2018 FINDINGS: Stable mild cardiac enlargement. Degree of interstitial edema appears improved since the prior x-ray. There is some probable atelectasis in both lower lung zones, right greater than left. There are small bilateral pleural effusions seen best on the lateral view. No pneumothorax. IMPRESSION: Stable cardiomegaly. Decrease in interstitial edema since the prior study with persistent atelectasis in both lower lung zones, right greater than left and small bilateral pleural effusions. Electronically Signed   By: Irish Lack M.D.   On: 06/15/2018 08:39   Dg Chest Port 1 View  Result Date: 06/03/2018 CLINICAL DATA:  Respiratory distress. EXAM: PORTABLE CHEST 1 VIEW 2:12 p.m. COMPARISON:  06/03/2018 at 6:14 a.m. and 06/02/2018 FINDINGS: The heart size and pulmonary vascularity are within normal limits. Tortuosity and calcification of the thoracic aorta. Hazy bilateral pulmonary infiltrates primarily in the lower lobes with increased consolidation at the left lung base since the prior study. No definable effusions. Faint Kerley B-lines at the lung  bases. IMPRESSION: Findings are consistent with bilateral pulmonary edema, slightly progressed at the left lung base. Aortic Atherosclerosis (ICD10-I70.0). Electronically Signed   By: Francene Boyers M.D.   On: 06/03/2018 14:36   Dg Chest Port 1 View  Result Date: 06/03/2018 CLINICAL DATA:  Short of breath EXAM: PORTABLE CHEST 1 VIEW COMPARISON:  06/02/2018 FINDINGS: Diffuse interstitial infiltrates and vascular congestion is are worse. More confluent opacity towards the lung bases likely due to airspace disease has developed. No pneumothorax or pleural effusion. Normal heart size. IMPRESSION: The above findings most likely represent worsening diffuse pulmonary edema with a normal heart size. An inflammatory process is not excluded. Electronically Signed   By: Jolaine Click M.D.   On: 06/03/2018 08:01   Dg Chest Port 1 View  Result Date: 06/02/2018 CLINICAL DATA:  Chest pain. EXAM: PORTABLE CHEST 1 VIEW COMPARISON:  None. FINDINGS: The heart size and mediastinal contours are within normal limits. No pneumothorax or pleural effusion is noted. Mild bilateral interstitial densities are noted, including Kerley B lines in both lung bases, concerning for possible pulmonary edema. The visualized skeletal structures are unremarkable. IMPRESSION: Findings concerning for mild bilateral pulmonary edema. Aortic Atherosclerosis (ICD10-I70.0). Electronically Signed   By: Lupita Raider, M.D.   On: 06/02/2018 16:06    Labs:  Basic Metabolic Panel: Recent Labs  Lab 06/10/18 0946 06/14/18 0602  NA 137 140  K 3.9 4.3  CL 102 106  CO2 22 22  GLUCOSE 193* 110*  BUN 65* 51*  CREATININE 2.17* 1.71*  CALCIUM 9.2 9.1    CBC: Recent Labs  Lab 06/10/18 0946 06/14/18 0602  WBC 9.6 11.7*  NEUTROABS 8.3* 9.9*  HGB 8.1* 8.5*  HCT 26.5* 28.6*  MCV 85.8 85.1  PLT 186 222    CBG: Recent Labs  Lab 06/15/18 0637 06/15/18 1149 06/15/18 1712 06/15/18 2057 06/16/18 0632  GLUCAP 94 242* 251* 296* 97    Family history.  Brother with bone cancer.  Mother with hypertension.  Denies diabetes  Brief HPI:    Richard Wang is a 73 year old right-handed male with history of COPD tobacco abuse home oxygen as well as Crohn's disease diabetes chronic low back pain CAD with non-STEMI renal insufficiency with atrial fibrillation and hypertension.  Patient presented to outside hospital with increasing shortness of breath and lower extremity edema.  Chest x-ray and echocardiogram showed moderate LV systolic dysfunction and moderate to severe mitral regurgitation and moderate pulmonary systolic hypertension consistent with CHF.  Patient was started on BiPAP however deteriorated ended up having to be intubated placed on a ventilator.  Patient subsequently complications associated with worsening renal function congestive heart failure attempts at extubation failed he was [facility] for ventilation management.  Initially on IV Solu-Medrol transition to oral prednisone.  Tracheostomy was not needed.  He was successfully extubated continued on prednisone with slow taper.  Subcutaneous heparin for DVT prophylaxis.  He remained on aspirin therapy and Cardizem for atrial fibrillation.  Therapy evaluations completed he was admitted to inpatient rehab services 05/17/2018.  Patient with slow progressive gains.  Noted on 06/02/2018 around 2 PM developed rapid heart rate nonspecific chest pain received nitroglycerin x2 and morphine x2 with little relief.  He was saturating well with 8 L of oxygen.  Heart rate noted to be in the 160s.  Troponin 0.14-1.24.  EKG showed rapid rate possibly some worsening heart block chest x-ray concerning for bilateral pulmonary edema he was discharged to acute care services cardiology follow-up echocardiogram with ejection fraction of 35 to 40% his amiodarone was resumed Cardizem adjusted.  Patient had been on nonrebreather mask for comfort later using nasal cannula.   His diet remained regular consistency.  Renal function was monitored creatinine 2.08.  Noted history of Crohn's disease received Remicade after contacts made to his GI specialist.  He was completed course of Bactrim for UTI and remained afebrile.  He was readmitted back to inpatient rehab services for ongoing therapies  Hospital Course: Richard Wang was admitted to rehab 06/06/2018 for inpatient therapies to consist of PT, ST and OT at least three hours five days a week. Past admission physiatrist, therapy team and rehab RN have worked together to provide customized collaborative inpatient rehab.  Pertaining to Mr. Kalla debility related to chronic respiratory failure with hypoxemia CAD.  Prednisone taper as advised with supplemental oxygen.  Close monitoring for any fluid overload.  He received follow-up by cardiology services for CAD/ CHF.  Maintained on Lasix 40 mg daily.  He remained on low-dose aspirin therapy.  Anxiety remained a contributing factor to nonspecific chest discomfort and patient maintained on Klonopin as needed.  He remained on amiodarone 200 mg daily, Lopressor 50 mg twice daily as well as Cardizem 300 mg daily.  Subcutaneous heparin for DVT prophylaxis.  No bleeding episodes.  Blood sugars overall maintained with prednisone tapered.  Pain management with the use of a Lidoderm patch as well as hydrocodone as needed.  Patient with BPH low-dose Flomax he was voiding better.  Physical exam.  Blood pressure 120/60 pulse 80 respirations 20 oxygen saturations 92% with 4 L of oxygen. Constitutional.  Oriented to person place and time frail-appearing HEENT Head normocephalic  atraumatic Eyes.  Pupils round and reactive to light EOMs normal without nystagmus Neck.  Normal range of motion no tracheal deviation no thyromegaly Cardiovascular normal rate without murmur Neurological.  Alert and oriented to person place and time follows full commands somewhat anxious upper extremities 3- 4 out of 5  right lower extremity 3 out of 5 hip flexors knee extension 4 out of 5 ankle dorsi plantarflexion left lower extremity 3 out of 5 hip flexors 4 out of 5 knee extension ankle plantar dorsiflexion  Rehab course: During patient's stay in rehab weekly team conferences were held to monitor patient's progress, set goals and discuss barriers to discharge. At admission, patient required minimal assist sit to stand modified independent overall bed mobility patient with a posterior lean steady assist for general transfers.  Moderate assist lower body bathing set up for upper body bathing moderate assist lower body bathing set up for upper body dressing  He  has had improvement in activity tolerance, balance, postural control as well as ability to compensate for deficits. He/She has had improvement in functional use RUE/LUE  and RLE/LLE as well as improvement in awareness.  Patient was attending therapies needing some encouragement.  Ambulating 50 feet rolling walker self-propelling his wheelchair supervision.  Patient had return to his functional level as prior to initial transfer back to acute care services.  Supervision for overall mobility except minimal assist needed to boost and to standing from surfaces without armrests.  Working with energy conservation techniques.  He can ambulate to the bathroom contact-guard assist rolling walker.  Don and doff clothing with minimal verbal cues contact-guard assist for bathing bathing overall completed at a set up supervision level.  Stand pivot from the shower contact-guard assist.  Full teaching completed plan discharged to home       Disposition: Discharged home   Diet: Carb modified diet  Special Instructions: No smoking driving or alcohol  Continue oxygen therapy as directed  Follow-up with Dr.Wimmer [facility] [phone] for next dose of Remicade  Medications at discharge. 1.  Allopurinol 100 mg p.o. daily 2.  Amiodarone 200 mg  p.o. daily 3.  Aspirin 81 mg p.o. daily 4.  Lipitor 40 mg p.o. daily 5.  Tums 1 tablet p.o. twice daily as needed 6.  Klonopin 0.5 mg p.o. 3 times daily as needed anxiety 7.  Cardizem 300 mg p.o. daily 8.  Colace 200 mg p.o. 3 times daily hold for loose stools 9.  Folic acid 1 mg p.o. daily 10.  Lasix 40 mg p.o. daily 11.  Hydrocodone 7.5-325 mg 1 tablet p.o. 3 times daily as needed pain 12.  Lidoderm patch change as directed 13.  Claritin 10 mg p.o. daily 14.  Robaxin 750 mg every 6 hours as needed muscle spasms 15.  Lopressor 50 mg p.o. twice daily 16.  Nitroglycerin as needed chest pain 17.  Protonix 40 mg p.o. twice daily 18.  MiraLAX daily 19.  Prednisone taper as directed 20 Flomax 0.4 mg daily  Discharge Instructions    Ambulatory referral to Physical Medicine Rehab   Complete by:  As directed    Moderate complexity follow-up 1-2 weeks debility related to acute chronic respiratory failure      Follow-up Information    Marcello Fennel, MD Follow up.   Specialty:  Physical Medicine and Rehabilitation Why:  Office to call for appointment Contact information: [address] [place] [address] [place] [address] [phone]  Orpah Cobb, MD Follow up.   Specialty:  Cardiology Why:  call for appointment Contact information: [address] [place] [address] [phone]           Signed: Mcarthur Rossetti Angiulli 06/16/2018, 9:25 AM Patient seen and examined by me on day of discharge. Maryla Morrow, MD, ABPMR

## 2018-06-16 NOTE — Progress Notes (Signed)
Occupational Therapy Session Note  Patient Details  Name: Richard Wang MRN: [id number] Date of Birth: 01/13/1946  Today's Date: 06/16/2018 OT Individual Time: 1332-1400 OT Individual Time Calculation (min): 28 min   Skilled Therapeutic Interventions/Progress Updates:    Pt greeted in bed with c/o stomach pain. Per RN, it was not yet time for pt to receive medication. He declined use of k-pad for thermotherapy. Provided encouragement to get OOB, practice slideboard transfers, or engage in any therapeutic activity in w/c. However, pt declined due to fatigue. To work on activity tolerance, had pt clean his room and pack ADL items in prep for d/c tomorrow. Increased exertion noted when pt leaned across his table to retrieve items for bag. 02 sats throughout session 96-100% on RA. At end of tx pt donned a pillowcase with increased time. He was left with all needs within reach and bed alarm set.   Therapy Documentation Precautions:  Precautions Precautions: Fall Precaution Comments: watch O2 and HR Restrictions Weight Bearing Restrictions: No ADL: ADL Eating: (UTA) Grooming: Setup(washed face in bed with set-up ) Upper Body Bathing: (UTA) ADL Comments: UTA BADLs 2/2 SOB and pt declining to participate      Therapy/Group: Individual Therapy  [name] A Pierrette Scheu 06/16/2018, 4:00 PM

## 2018-06-16 NOTE — Plan of Care (Signed)
  Problem: Consults Goal: RH GENERAL PATIENT EDUCATION Description See Patient Education module for education specifics.mod assist  Outcome: Progressing Goal: Skin Care Protocol Initiated - if Braden Score 18 or less Description If consults are not indicated, leave blank or document N/A; mod assistance  Outcome: Progressing Goal: Nutrition Consult-if indicated Description Mod assistance  Outcome: Progressing Goal: Diabetes Guidelines if Diabetic/Glucose > 140 Description If diabetic or lab glucose is > 140 mg/dl - Initiate Diabetes/Hyperglycemia Guidelines & Document Interventions mod assistance  Outcome: Progressing   Problem: RH BOWEL ELIMINATION Goal: RH STG MANAGE BOWEL WITH ASSISTANCE Description STG Manage Bowel with Assistance.mod assistance  Outcome: Progressing Goal: RH STG MANAGE BOWEL W/MEDICATION W/ASSISTANCE Description STG Manage Bowel with Medication with mod  Assistance.  Outcome: Progressing   Problem: RH SKIN INTEGRITY Goal: RH STG SKIN FREE OF INFECTION/BREAKDOWN Description Patient free from skin infection entire stay on rehab min assist  Outcome: Progressing   Problem: RH SAFETY Goal: RH STG ADHERE TO SAFETY PRECAUTIONS W/ASSISTANCE/DEVICE Description STG Adhere to Safety Precautions With  Mod Assistance/Device.  Outcome: Progressing   Problem: RH PAIN MANAGEMENT Goal: RH STG PAIN MANAGED AT OR BELOW PT'S PAIN GOAL Description Pain less than 4  Outcome: Progressing

## 2018-06-16 NOTE — Progress Notes (Signed)
Pt stayed on room air since 0900. O2 sat dropped to 76 during a transfer, but came back up to 98% in less that 10 secs. Otherwise O2 sat remained between 97-100. Continue plan of care.   Richard Wang W Kingslee Mairena

## 2018-06-16 NOTE — Progress Notes (Signed)
[place] PHYSICAL MEDICINE & REHABILITATION PROGRESS NOTE  Subjective/Complaints:  Les SOB using IS, gets it to with goal of  ROS: + Mild shortness of breath.  Denies CP, N/V/D  Objective: Vital Signs: Blood pressure 111/81, pulse 67, temperature 97.9 F (36.6 C), temperature source Oral, resp. rate 18, height 5\' 10"  (1.778 m), weight 75.2 kg, SpO2 99 %. Dg Chest 2 View  Result Date: 06/15/2018 CLINICAL DATA:  Shortness of breath and history of pulmonary edema and respiratory failure. EXAM: CHEST - 2 VIEW COMPARISON:  06/03/2018 FINDINGS: Stable mild cardiac enlargement. Degree of interstitial edema appears improved since the prior x-ray. There is some probable atelectasis in both lower lung zones, right greater than left. There are small bilateral pleural effusions seen best on the lateral view. No pneumothorax. IMPRESSION: Stable cardiomegaly. Decrease in interstitial edema since the prior study with persistent atelectasis in both lower lung zones, right greater than left and small bilateral pleural effusions. Electronically Signed   By: Irish Lack M.D.   On: 06/15/2018 08:39   Recent Labs    06/14/18 0602  WBC 11.7*  HGB 8.5*  HCT 28.6*  PLT 222   Recent Labs    06/14/18 0602  NA 140  K 4.3  CL 106  CO2 22  GLUCOSE 110*  BUN 51*  CREATININE 1.71*  CALCIUM 9.1    Physical Exam: BP 111/81 (BP Location: Right Arm)   Pulse 67   Temp 97.9 F (36.6 C) (Oral)   Resp 18   Ht 5\' 10"  (1.778 m)   Wt 75.2 kg   SpO2 99%   BMI 23.79 kg/m  Constitutional: No distress . Vital signs reviewed. HENT: Normocephalic.  Atraumatic. Eyes: EOMI.  No discharge. Cardiovascular:IRREG no murmur  No JVD. Respiratory: Normal effort.  Lungs clear no wheezing  GI: Non-distended. Musc: No edema or tenderness in extremities. Neurological: Patient is alert  Follows commands.  Motor: Bilateral upper extremities: 4+/5 proximal distal Right lower extremity: Hip flexion,  knee extension 3/5, ankle dorsiflexion 4-/5, unchanged Left lower extremity: Hip flexion, knee extension 3/5, ankle dorsiflexion 1/5, unchanged Skin: Color is chronically grayin face Psych: Flat  Assessment/Plan: 1. Functional deficits secondary to chronic respiratory failure with multiple medical complications, including cardiac and rheumatological which require 3+ hours per day of interdisciplinary therapy in a comprehensive inpatient rehab setting.  Physiatrist is providing close team supervision and 24 hour management of active medical problems listed below.  Physiatrist and rehab team continue to assess barriers to discharge/monitor patient progress toward functional and medical goals  Care Tool:  Bathing    Body parts bathed by patient: Right arm, Left arm, Chest, Abdomen, Front perineal area, Right upper leg, Left upper leg, Face, Right lower leg, Left lower leg, Buttocks   Body parts bathed by helper: Buttocks     Bathing assist Assist Level: Contact Guard/Touching assist     Upper Body Dressing/Undressing Upper body dressing Upper body dressing/undressing activity did not occur (including orthotics): Refused What is the patient wearing?: Pull over shirt    Upper body assist Assist Level: Set up assist    Lower Body Dressing/Undressing Lower body dressing    Lower body dressing activity did not occur: Refused What is the patient wearing?: Underwear/pull up, Pants     Lower body assist Assist for lower body dressing: Contact Guard/Touching assist     Toileting Toileting    Toileting assist Assist for toileting: Minimal Assistance - Patient > 75%  Transfers Chair/bed transfer  Transfers assist  Chair/bed transfer activity did not occur: Refused  Chair/bed transfer assist level: Supervision/Verbal cueing     Locomotion Ambulation   Ambulation assist   Ambulation activity did not occur: Safety/medical concerns  Assist level: Contact  Guard/Touching assist Assistive device: Walker-rolling Max distance: 50'   Walk 10 feet activity   Assist  Walk 10 feet activity did not occur: Safety/medical concerns  Assist level: Contact Guard/Touching assist Assistive device: Walker-rolling   Walk 50 feet activity   Assist Walk 50 feet with 2 turns activity did not occur: Safety/medical concerns  Assist level: Contact Guard/Touching assist Assistive device: Walker-rolling    Walk 150 feet activity   Assist Walk 150 feet activity did not occur: Safety/medical concerns         Walk 10 feet on uneven surface  activity   Assist Walk 10 feet on uneven surfaces activity did not occur: Safety/medical concerns         Wheelchair     Assist Will patient use wheelchair at discharge?: Yes Type of Wheelchair: Manual Wheelchair activity did not occur: Safety/medical concerns  Wheelchair assist level: Supervision/Verbal cueing Max wheelchair distance: 150'    Wheelchair 50 feet with 2 turns activity    Assist    Wheelchair 50 feet with 2 turns activity did not occur: Safety/medical concerns   Assist Level: Supervision/Verbal cueing   Wheelchair 150 feet activity     Assist Wheelchair 150 feet activity did not occur: Safety/medical concerns   Assist Level: Supervision/Verbal cueing      Medical Problem List and Plan: 1.Debilitysecondary to acute on chronic respiratory failurewith hypoxemia and hypercarbia, CAD/CHF with history of colitis on Remicade.  Continue CIR   Prednisone decreased to 50 on 4/20, decreased to 40 on 4/24  Continue supplemental oxygen, wean as tolerated, discussed documentation  Flutter valve ordered do not see Will order IS given no secretions  Plan for d/c on Monday, ? If he qualifies for O2 given normal sats  2. Antithrombotics: -DVT/anticoagulation:Subcutaneous heparin -antiplatelet therapy: Aspirin 81 mg daily 3. Pain Management/Chronic back  pain:Hydrocodone and Robaxin as needed  Needs regular encouragement  -kpad for chest wall, prn ice  -added levbid for GI cramping, DC'd on 4/22  Lidoderm patch added on 4/21 with significant benefit 4. Mood:Provide emotional support  Klonopin 3 times daily started on 4/17, increased on 4/20 -antipsychotic agents: N/A 5. Neuropsych: This patientiscapable of making decisions on hisown behalf. 6. Skin/Wound Care:Routine skin checks 7. Fluids/Electrolytes/Nutrition:Routine in and outs 8.CAD with non-STEMI. Continue aspirin. Follow-up per cardiology services  No CP  Repeat ECG on 4/18 reviewed, showing A. fib and prolonged QTC  Continue with cards recs  Improved 9.Atrial fibrillation. Diltiazem daily/amiodarone daily/hydralazine/metoprolol.   Medications being adjusted by Cards, appreciate recs  Heart rate controlled on 4/24 10.Hypertension.  See #9   Vitals:   06/15/18 1920 06/16/18 0548  BP: (!) 105/59 111/81  Pulse: 83 67  Resp: 18 18  Temp: 98 F (36.7 C) 97.9 F (36.6 C)  SpO2: 100% 99%   11. Diastolic congestive heart failure. Monitor for any signs of fluid overload.Continue Lasix as directed  Daily weights ordered.  Filed Weights   06/14/18 0457 06/15/18 0505 06/16/18 0548  Weight: 75.5 kg 74.7 kg 75.2 kg   Stable on 4/24 12. Urinary retention  Urecholine 10 mg 3 times a day, Flomax 0.4 mg daily.    Klebsiella pneumo UTI. Completed course of Bactrim on 4/22 13. Hyperlipidemia. Lipitor 14.  Steroid-induced hyperglycemia with hx of DM. Latest hemoglobin A1c 5.0.   Patient on metformin 1000 mg twice a day prior to admissionbut held due to mildly elevated creatinine.   Presently on Lantus insulin 7 units daily at bedtime, with 5 mg daily.   Check blood sugars before meals and at bedtime. Diabetic teaching  Monitor with taper of prednisone. Will reduce prednisone to  tomorrow   Labile on 4/24 15. Crohn's colitis with history of GI bleed.  Protonix 40 mg twice a day.Patientdid receive Remicade 06/05/2018 and is scheduled every 8 weeks  Need to be particularly cautious due to immunosuppression  MiraLAX daily ordered on 4/20  Colace 3 times daily ordered on 4/21, increased on 4/23  Now at baseline 16. AKI/renal insufficiency. Strict in and out's.   Creatinine 1.71 on 4/24  Will consider IVF if necessary  Encourage fluids 16. Tobacco abuse. Counseling 17. Acute on chronic anemia.   Hemoglobin 8.5 on 4/24 18. Hyponatremia: Resolved  Sodium 140 on 4/24 19.  Hyperkalemia: Resolved  Potassium 3.9 on 4/20 20.  Transaminitis: Resolved  LFTs within normal limits on 4/20 21.  Leukocytosis: Likely steroid-induced  Afebrile 4/25  WBC 11.7 on 4/24 22.  SOB likely anxiety induced , exam is normal sats normal ,afeb  CXR 4/25 showing atelectasis, now using IS , gets to   LOS: 10 days A FACE TO FACE EVALUATION WAS PERFORMED  Erick Colace 06/16/2018, 6:55 AM

## 2018-06-17 ENCOUNTER — Inpatient Hospital Stay (HOSPITAL_COMMUNITY): Payer: Medicare Other | Admitting: Occupational Therapy

## 2018-06-17 ENCOUNTER — Inpatient Hospital Stay (HOSPITAL_COMMUNITY): Payer: Medicare Other | Admitting: Physical Therapy

## 2018-06-17 DIAGNOSIS — R0602 Shortness of breath: Secondary | ICD-10-CM

## 2018-06-17 LAB — GLUCOSE, CAPILLARY: Glucose-Capillary: 80 mg/dL (ref 70–99)

## 2018-06-17 MED ORDER — ALLOPURINOL 100 MG PO TABS: 100.0000 mg | ORAL_TABLET | Freq: Every day | ORAL | 1 refills | Status: AC

## 2018-06-17 MED ORDER — PREDNISONE 10 MG PO TABS: 10.0000 mg | ORAL_TABLET | Freq: Every day | ORAL | 0 refills | Status: AC

## 2018-06-17 MED ORDER — FOLIC ACID 1 MG PO TABS: 1.0000 mg | ORAL_TABLET | Freq: Every day | ORAL | 0 refills | Status: AC

## 2018-06-17 MED ORDER — PREDNISONE 10 MG PO TABS: ORAL_TABLET | ORAL | 0 refills | Status: AC

## 2018-06-17 MED ORDER — DOCUSATE SODIUM 100 MG PO CAPS: 200.0000 mg | ORAL_CAPSULE | Freq: Three times a day (TID) | ORAL | 0 refills | Status: AC

## 2018-06-17 MED ORDER — LORATADINE 10 MG PO TABS: 10.0000 mg | ORAL_TABLET | Freq: Every day | ORAL | 0 refills | Status: AC

## 2018-06-17 MED ORDER — INSULIN GLARGINE 100 UNIT/ML SOLOSTAR PEN: 5.0000 [IU] | PEN_INJECTOR | Freq: Every day | SUBCUTANEOUS | 11 refills | Status: DC

## 2018-06-17 MED ORDER — PANTOPRAZOLE SODIUM 40 MG PO TBEC: 40.0000 mg | DELAYED_RELEASE_TABLET | Freq: Two times a day (BID) | ORAL | 0 refills | Status: AC

## 2018-06-17 MED ORDER — METOPROLOL TARTRATE 50 MG PO TABS: 50.0000 mg | ORAL_TABLET | Freq: Two times a day (BID) | ORAL | 1 refills | Status: AC

## 2018-06-17 MED ORDER — TAMSULOSIN HCL 0.4 MG PO CAPS: 0.4000 mg | ORAL_CAPSULE | Freq: Every day | ORAL | 0 refills | Status: AC

## 2018-06-17 MED ORDER — LIDOCAINE 5 % EX PTCH: 1.0000 | MEDICATED_PATCH | CUTANEOUS | 0 refills | Status: AC

## 2018-06-17 MED ORDER — CALCIUM CARBONATE ANTACID 500 MG PO CHEW: 1.0000 | CHEWABLE_TABLET | Freq: Two times a day (BID) | ORAL | 0 refills | Status: AC | PRN

## 2018-06-17 MED ORDER — THIAMINE HCL 100 MG PO TABS: 100.0000 mg | ORAL_TABLET | Freq: Every day | ORAL | 0 refills | Status: AC

## 2018-06-17 MED ORDER — ONDANSETRON HCL 4 MG PO TABS: 4.0000 mg | ORAL_TABLET | Freq: Three times a day (TID) | ORAL | 0 refills | Status: AC | PRN

## 2018-06-17 MED ORDER — DILTIAZEM HCL ER COATED BEADS 300 MG PO CP24: 300.0000 mg | ORAL_CAPSULE | Freq: Every day | ORAL | 0 refills | Status: AC

## 2018-06-17 MED ORDER — CLONAZEPAM 0.5 MG PO TABS: 0.5000 mg | ORAL_TABLET | Freq: Two times a day (BID) | ORAL | 0 refills | Status: AC | PRN

## 2018-06-17 MED ORDER — INSULIN GLARGINE 100 UNIT/ML SOLOSTAR PEN: 7.0000 [IU] | PEN_INJECTOR | Freq: Every day | SUBCUTANEOUS | 11 refills | Status: DC

## 2018-06-17 MED ORDER — AMIODARONE HCL 200 MG PO TABS: 200.0000 mg | ORAL_TABLET | Freq: Every day | ORAL | 1 refills | Status: AC

## 2018-06-17 MED ORDER — HYDROCODONE-ACETAMINOPHEN 7.5-325 MG PO TABS: 1.0000 | ORAL_TABLET | Freq: Three times a day (TID) | ORAL | 0 refills | Status: AC | PRN

## 2018-06-17 MED ORDER — ATORVASTATIN CALCIUM 40 MG PO TABS: 40.0000 mg | ORAL_TABLET | Freq: Every day | ORAL | 1 refills | Status: AC

## 2018-06-17 MED ORDER — FUROSEMIDE 40 MG PO TABS: 40.0000 mg | ORAL_TABLET | Freq: Every day | ORAL | 11 refills | Status: AC

## 2018-06-17 MED ORDER — METHOCARBAMOL 750 MG PO TABS: 750.0000 mg | ORAL_TABLET | Freq: Four times a day (QID) | ORAL | 0 refills | Status: AC | PRN

## 2018-06-17 NOTE — Plan of Care (Signed)
  Problem: Consults Goal: RH GENERAL PATIENT EDUCATION Description See Patient Education module for education specifics.mod assist  Outcome: Completed/Met Goal: Skin Care Protocol Initiated - if Braden Score 18 or less Description If consults are not indicated, leave blank or document N/A; mod assistance  Outcome: Completed/Met Goal: Nutrition Consult-if indicated Description Mod assistance  Outcome: Completed/Met Goal: Diabetes Guidelines if Diabetic/Glucose > 140 Description If diabetic or lab glucose is > 140 mg/dl - Initiate Diabetes/Hyperglycemia Guidelines & Document Interventions mod assistance  Outcome: Completed/Met   Problem: RH BOWEL ELIMINATION Goal: RH STG MANAGE BOWEL WITH ASSISTANCE Description STG Manage Bowel with Assistance.mod assistance  Outcome: Completed/Met Goal: RH STG MANAGE BOWEL W/MEDICATION W/ASSISTANCE Description STG Manage Bowel with Medication with mod  Assistance.  Outcome: Completed/Met   Problem: RH SKIN INTEGRITY Goal: RH STG SKIN FREE OF INFECTION/BREAKDOWN Description Patient free from skin infection entire stay on rehab min assist  Outcome: Completed/Met   Problem: RH SAFETY Goal: RH STG ADHERE TO SAFETY PRECAUTIONS W/ASSISTANCE/DEVICE Description STG Adhere to Safety Precautions With  Mod Assistance/Device.  Outcome: Completed/Met   Problem: RH PAIN MANAGEMENT Goal: RH STG PAIN MANAGED AT OR BELOW PT'S PAIN GOAL Description Pain less than 4  Outcome: Completed/Met

## 2018-06-17 NOTE — Progress Notes (Addendum)
Brief note:  Weekend notes reviewed, discussed with nursing shortness of breath overnight which resolved with supplemental oxygen.  Will discontinue bedtime insulin.  Patient will need to monitor with steroid wean.  Discussed with PA.  Please also see discharge summary.  Patient medically stable for discharge today with supplemental home oxygen.

## 2018-06-17 NOTE — Progress Notes (Signed)
Patient complained of SOB, O2 sat 100% Xopenex PRN given and was effective. We continue to monitor.

## 2018-06-17 NOTE — Progress Notes (Signed)
Occupational Therapy Discharge Summary  Patient Details  Name: Richard Wang MRN: [id number] Date of Birth: 04-04-1945  Today's Date: 06/17/2018 OT Individual Time: 1005-1045 OT Individual Time Calculation (min): 40 min   Pt greeted sitting in wc with niece present for family education. Educated niece on use of gait belt for ambulation if needed and for car transfers. Pt then reported need to go to the bathroom. Pt able to stand from wc with supervsion and RW. Pt's niece then ambulated with him into the bathroom with close supervision. Pt able to manage clothing and have successful BM. Supervision for toileting tasks. Verbal cues for RW palcement when standing at the sink to wash hands. Pt reached max fatigue and required min A to safely sit down to wc. Discussed energy conservation techniques and home bathroom set-up. Pt plans to sponge bathe until home health comes to assess tub shower transfers. Pt's niece also states they plan to install a walk-in-shower very soon. Pt and niece with questions regarding O2 for car ride home. Per case manager, O2 will be delivered to room prior to dc. Pt and niece feel ready for dc from and OT standpoint. Pt left seated in wc with niece present and needs met.    Patient has met 9 of 10 long term goals due to improved activity tolerance, improved balance, postural control, ability to compensate for deficits and functional use of  LEFT lower extremity.  Patient to discharge at overall Supervision level.  Patient's care partner is independent to provide the necessary physical assistance at discharge for higher level iADL tasks.    Reasons goals not met: Pt still need occasional min A when standing from lower surfaces that do not have arm rests. Adequate for discharge.   Recommendation:  Patient will benefit from ongoing skilled OT services in home health setting to continue to advance functional skills in the area of BADL.  Equipment: 3-in-1 BSC, RW, wheelchair,  slideboard  Reasons for discharge: treatment goals met and discharge from hospital  Patient/family agrees with progress made and goals achieved: Yes  OT Discharge Precautions/Restrictions  Precautions Precautions: Fall Restrictions Weight Bearing Restrictions: No Pain  denies pain ADL ADL Eating: Independent Grooming: Independent Upper Body Bathing: Supervision/safety Lower Body Bathing: Supervision/safety Upper Body Dressing: Supervision/safety Lower Body Dressing: Supervision/safety Toileting: Supervision/safety Toilet Transfer: Close supervision Toilet Transfer Equipment: Raised toilet seat ADL Comments: UTA BADLs 2/2 SOB and pt declining to participate Perception  Perception: Within Functional Limits Praxis Praxis: Intact Cognition Overall Cognitive Status: Within Functional Limits for tasks assessed Arousal/Alertness: Awake/alert Orientation Level: Oriented X4 Safety/Judgment: Appears intact Sensation Sensation Light Touch: Impaired Detail Central sensation comments: intact  Peripheral sensation comments: diminished in LEs Light Touch Impaired Details: (distal>proximal) Coordination Gross Motor Movements are Fluid and Coordinated: No Fine Motor Movements are Fluid and Coordinated: Yes Finger Nose Finger Test: [facility] Heel Shin Test: impaired bilaterally 2/2 weakness Motor  Motor Motor - Discharge Observations: generalized weakness, L foot drop  Mobility  Bed Mobility Supine to Sit: Independent with assistive device Sit to Supine: Independent with assistive device Transfers Sit to Stand: Contact Guard/Touching assist(supervision from surface w/ stable B armrests) Stand to Sit: Supervision/Verbal cueing  Trunk/Postural Assessment  Cervical Assessment Cervical Assessment: Exceptions to WFL(forward head) Thoracic Assessment Thoracic Assessment: Within Functional Limits Lumbar Assessment Lumbar Assessment: Exceptions to WFL(posterior pelvic tilt, chronic  pain) Postural Control Postural Control: Within Functional Limits  Balance Balance Balance Assessed: Yes Static Sitting Balance Static Sitting - Balance Support: Feet supported;No upper  extremity supported Static Sitting - Level of Assistance: 7: Independent Dynamic Sitting Balance Dynamic Sitting - Balance Support: During functional activity;Feet supported Dynamic Sitting - Level of Assistance: 7: Independent Static Standing Balance Static Standing - Balance Support: Bilateral upper extremity supported;During functional activity Static Standing - Level of Assistance: 5: Stand by assistance Dynamic Standing Balance Dynamic Standing - Balance Support: During functional activity;No upper extremity supported Dynamic Standing - Level of Assistance: 5: Stand by assistance Extremity/Trunk Assessment RUE Assessment RUE Assessment: Exceptions to [facility] Passive Range of Motion (PROM) Comments: WFLs Active Range of Motion (AROM) Comments: X General Strength Comments: 4+/5 RUE Strength Right Shoulder Flexion: 4+/5 LUE Assessment LUE Assessment: Exceptions to [facility] Passive Range of Motion (PROM) Comments: WFLs Active Range of Motion (AROM) Comments: x General Strength Comments: 4+/5   Daneen Schick Tatisha Cerino 06/17/2018, 11:01 AM

## 2018-06-17 NOTE — Progress Notes (Signed)
Social Work  Discharge Note  The overall goal for the admission was met for:   Discharge location: Yes - returning home with sister and other family members providing 24/7 assistance.  Length of Stay: Yes - 9 days  Discharge activity level: Yes - supervision  Home/community participation: Yes  Services provided included: MD, RD, PT, OT, RN, Pharmacy, Neuropsych and SW  Financial Services: Medicare  Follow-up services arranged: Home Health: RN, PT, OT via [facility], DME: 18x18 lightweight w/c, cushion, rolling walker, 3n1 commode, oxygen via [facility] and Patient/Family has no preference for HH/DME agencies  Comments (or additional information):      Contact info:  Pt @ home [phone]                 Niece, Richarda Blade @ [phone]  Patient/Family verbalized understanding of follow-up arrangements: Yes  Individual responsible for coordination of the follow-up plan: pt  Confirmed correct DME delivered: Lennart Pall 06/17/2018    Mysti Haley, Lorre Nick

## 2018-06-17 NOTE — Progress Notes (Signed)
  Patient ID: Richard Wang, male   DOB: May 18, 1945, 73 y.o.   MRN: [id number]   SATURATION QUALIFICATIONS: (This note is used to comply with regulatory documentation for home oxygen)  Patient Saturations on Room Air at Rest = 100%  Patient Saturations on Room Air while Ambulating = 66%  Patient Saturations on 1-2 Liters of oxygen while Ambulating = 95%  Please briefly explain why patient needs home oxygen:  Pt with significant debility following acute on chronic respiratory failure, COPD and CHF.   Harvel Ricks, PA-C

## 2018-06-17 NOTE — Discharge Instructions (Signed)
Inpatient Rehab Discharge Instructions  Richard Wang Discharge date and time: No discharge date for patient encounter.   Activities/Precautions/ Functional Status: Activity: activity as tolerated Diet: diabetic diet Wound Care: none needed Functional status:  ___ No restrictions     ___ Walk up steps independently ___ 24/7 supervision/assistance   ___ Walk up steps with assistance ___ Intermittent supervision/assistance  ___ Bathe/dress independently ___ Walk with walker     _x__ Bathe/dress with assistance ___ Walk Independently    ___ Shower independently ___ Walk with assistance    ___ Shower with assistance ___ No alcohol     ___ Return to work/school ________     COMMUNITY REFERRALS UPON DISCHARGE:    Home Health:   PT     OT    RN                     Agency:  [facility]  Phone: [phone]   Medical Equipment/Items Ordered:  Wheelchair, cushion, walker, commode and oxygen                                                      Agency/Supplier:  [facility] @ [phone]      Special Instructions: No driving smoking or alcohol  Follow-up Dr.W[facility] [phone]   My questions have been answered and I understand these instructions. I will adhere to these goals and the provided educational materials after my discharge from the hospital.  Patient/Caregiver Signature _______________________________ Date __________  Clinician Signature _______________________________________ Date __________  Please bring this form and your medication list with you to all your follow-up doctor's appointments.

## 2018-06-19 ENCOUNTER — Telehealth: Payer: Self-pay

## 2018-06-19 NOTE — Telephone Encounter (Signed)
Selena Batten, RN from [facility] called requesting verbal orders for [facility] 1wk4 and 2 prn. Orders approved and given per discharge summary.

## 2018-06-20 ENCOUNTER — Encounter: Payer: Self-pay | Admitting: Registered Nurse

## 2018-06-20 ENCOUNTER — Encounter: Payer: Medicare Other | Attending: Registered Nurse | Admitting: Registered Nurse

## 2018-06-20 ENCOUNTER — Other Ambulatory Visit: Payer: Self-pay

## 2018-06-20 DIAGNOSIS — I25110 Atherosclerotic heart disease of native coronary artery with unstable angina pectoris: Secondary | ICD-10-CM | POA: Diagnosis not present

## 2018-06-20 DIAGNOSIS — I5041 Acute combined systolic (congestive) and diastolic (congestive) heart failure: Secondary | ICD-10-CM

## 2018-06-20 DIAGNOSIS — Z9981 Dependence on supplemental oxygen: Secondary | ICD-10-CM | POA: Diagnosis not present

## 2018-06-20 DIAGNOSIS — R5381 Other malaise: Secondary | ICD-10-CM | POA: Diagnosis not present

## 2018-06-20 NOTE — Progress Notes (Signed)
Transitional Care call  Patient name: Richard Wang  DOB: 02/06/1946 1. Are you/is patient experiencing any problems since coming home? No a. Are there any questions regarding any aspect of care? No 2. Are there any questions regarding medications administration/dosing? No a. Are meds being taken as prescribed? Yes b. "Patient should review meds with caller to confirm" Medication List Reviewed 3. Have there been any falls? No 4. Has Home Health been to the house and/or have they contacted you? Yes, [facility] a. If not, have you tried to contact them?See Above b. Can we help you contact them? NA 5. Are bowels and bladder emptying properly? Yes a. Are there any unexpected incontinence issues? No b. If applicable, is patient following bowel/bladder programs? NA 6. Any fevers, problems with breathing, unexpected pain? No 7. Are there any skin problems or new areas of breakdown? No 8. Has the patient/family member arranged specialty MD follow up (ie cardiology/neurology/renal/surgical/etc.)?  He states his Cardiologist is Dr. Carole Binning he was encouraged to schedule HFU appointment he verbalizes understanding.  a. Can we help arrange? NA 9. Does the patient need any other services or support that we can help arrange? No 10. Are caregivers following through as expected in assisting the patient? Yes 11. Has the patient quit smoking, drinking alcohol, or using drugs as recommended? [name] states he doesn't smoke, drink alcohol or use illicit drugs.   Virtual Appointment/ Telephone Call:  06/28/2018 at 10:00 am. At [address] [facility] [address]

## 2018-06-26 DIAGNOSIS — K5090 Crohn's disease, unspecified, without complications: Secondary | ICD-10-CM

## 2018-06-26 DIAGNOSIS — J449 Chronic obstructive pulmonary disease, unspecified: Secondary | ICD-10-CM

## 2018-06-26 DIAGNOSIS — I2510 Atherosclerotic heart disease of native coronary artery without angina pectoris: Secondary | ICD-10-CM

## 2018-06-26 DIAGNOSIS — I130 Hypertensive heart and chronic kidney disease with heart failure and stage 1 through stage 4 chronic kidney disease, or unspecified chronic kidney disease: Secondary | ICD-10-CM

## 2018-06-26 DIAGNOSIS — Z9181 History of falling: Secondary | ICD-10-CM

## 2018-06-26 DIAGNOSIS — Z9981 Dependence on supplemental oxygen: Secondary | ICD-10-CM

## 2018-06-26 DIAGNOSIS — M545 Low back pain: Secondary | ICD-10-CM

## 2018-06-26 DIAGNOSIS — M21372 Foot drop, left foot: Secondary | ICD-10-CM

## 2018-06-26 DIAGNOSIS — Z7952 Long term (current) use of systemic steroids: Secondary | ICD-10-CM

## 2018-06-26 DIAGNOSIS — I4891 Unspecified atrial fibrillation: Secondary | ICD-10-CM

## 2018-06-26 DIAGNOSIS — E1122 Type 2 diabetes mellitus with diabetic chronic kidney disease: Secondary | ICD-10-CM

## 2018-06-26 DIAGNOSIS — I5040 Unspecified combined systolic (congestive) and diastolic (congestive) heart failure: Secondary | ICD-10-CM

## 2018-06-26 DIAGNOSIS — E1142 Type 2 diabetes mellitus with diabetic polyneuropathy: Secondary | ICD-10-CM

## 2018-06-26 DIAGNOSIS — D631 Anemia in chronic kidney disease: Secondary | ICD-10-CM

## 2018-06-26 DIAGNOSIS — G8929 Other chronic pain: Secondary | ICD-10-CM

## 2018-06-26 DIAGNOSIS — N182 Chronic kidney disease, stage 2 (mild): Secondary | ICD-10-CM

## 2018-06-26 DIAGNOSIS — I252 Old myocardial infarction: Secondary | ICD-10-CM

## 2018-06-26 DIAGNOSIS — J9611 Chronic respiratory failure with hypoxia: Secondary | ICD-10-CM

## 2018-06-26 DIAGNOSIS — Z87440 Personal history of urinary (tract) infections: Secondary | ICD-10-CM

## 2018-06-26 DIAGNOSIS — J9612 Chronic respiratory failure with hypercapnia: Secondary | ICD-10-CM

## 2018-06-28 ENCOUNTER — Encounter: Payer: Medicare Other | Admitting: Physical Medicine & Rehabilitation

## 2018-07-22 DEATH — deceased

## 2019-05-12 IMAGING — DX PORTABLE CHEST - 1 VIEW
1 series · 1 of 1 positions shown · non-contrast
Comparison: 06/02/2018

CLINICAL DATA: Short of breath

EXAM:
PORTABLE CHEST 1 VIEW

[chest ap]
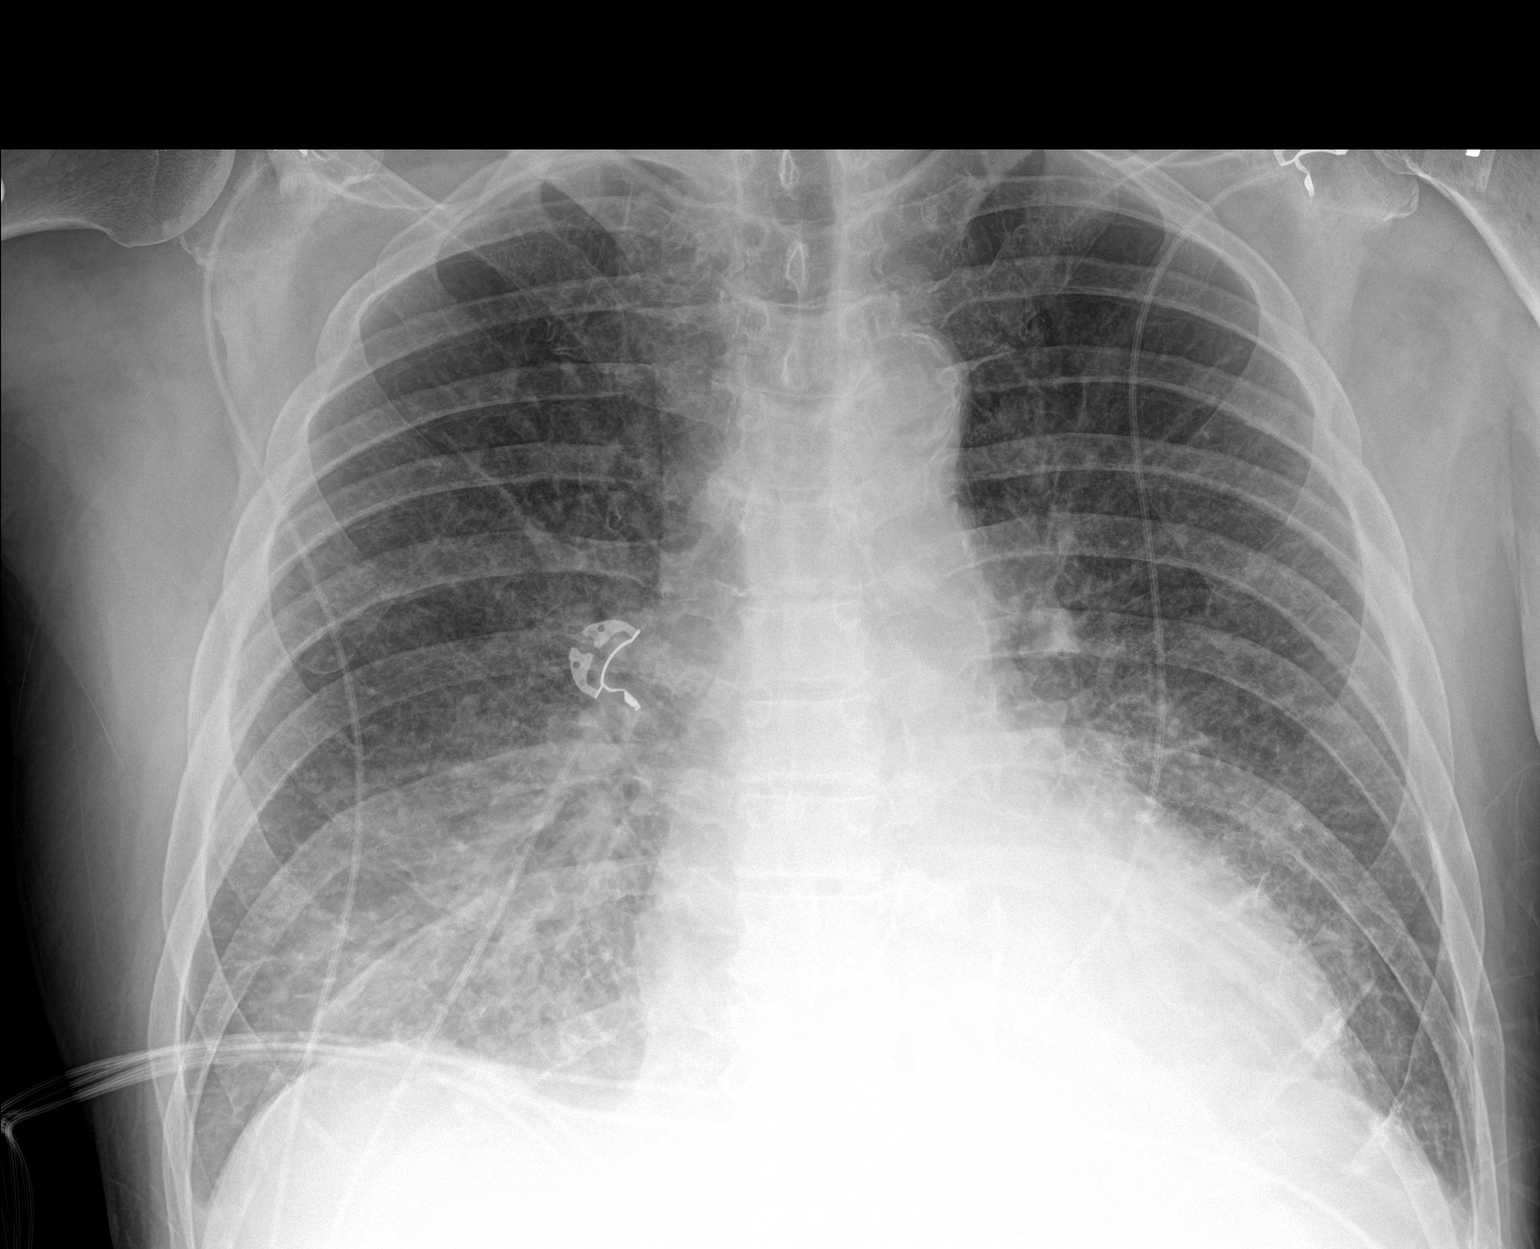

[1 of 1 positions shown; findings below may reference images not displayed]

FINDINGS: Diffuse interstitial infiltrates and vascular congestion is are
worse. More confluent opacity towards the lung bases likely due to
airspace disease has developed. No pneumothorax or pleural effusion.
Normal heart size.
IMPRESSION: The above findings most likely represent worsening diffuse pulmonary
edema with a normal heart size. An inflammatory process is not
excluded.

## 2019-05-12 IMAGING — DX PORTABLE CHEST - 1 VIEW
1 series · 1 of 1 positions shown · non-contrast
Comparison: 06/03/2018 at [DATE] a.m. and 06/02/2018

CLINICAL DATA: Respiratory distress.

EXAM:
PORTABLE CHEST 1 VIEW [DATE] p.m.

[chest]
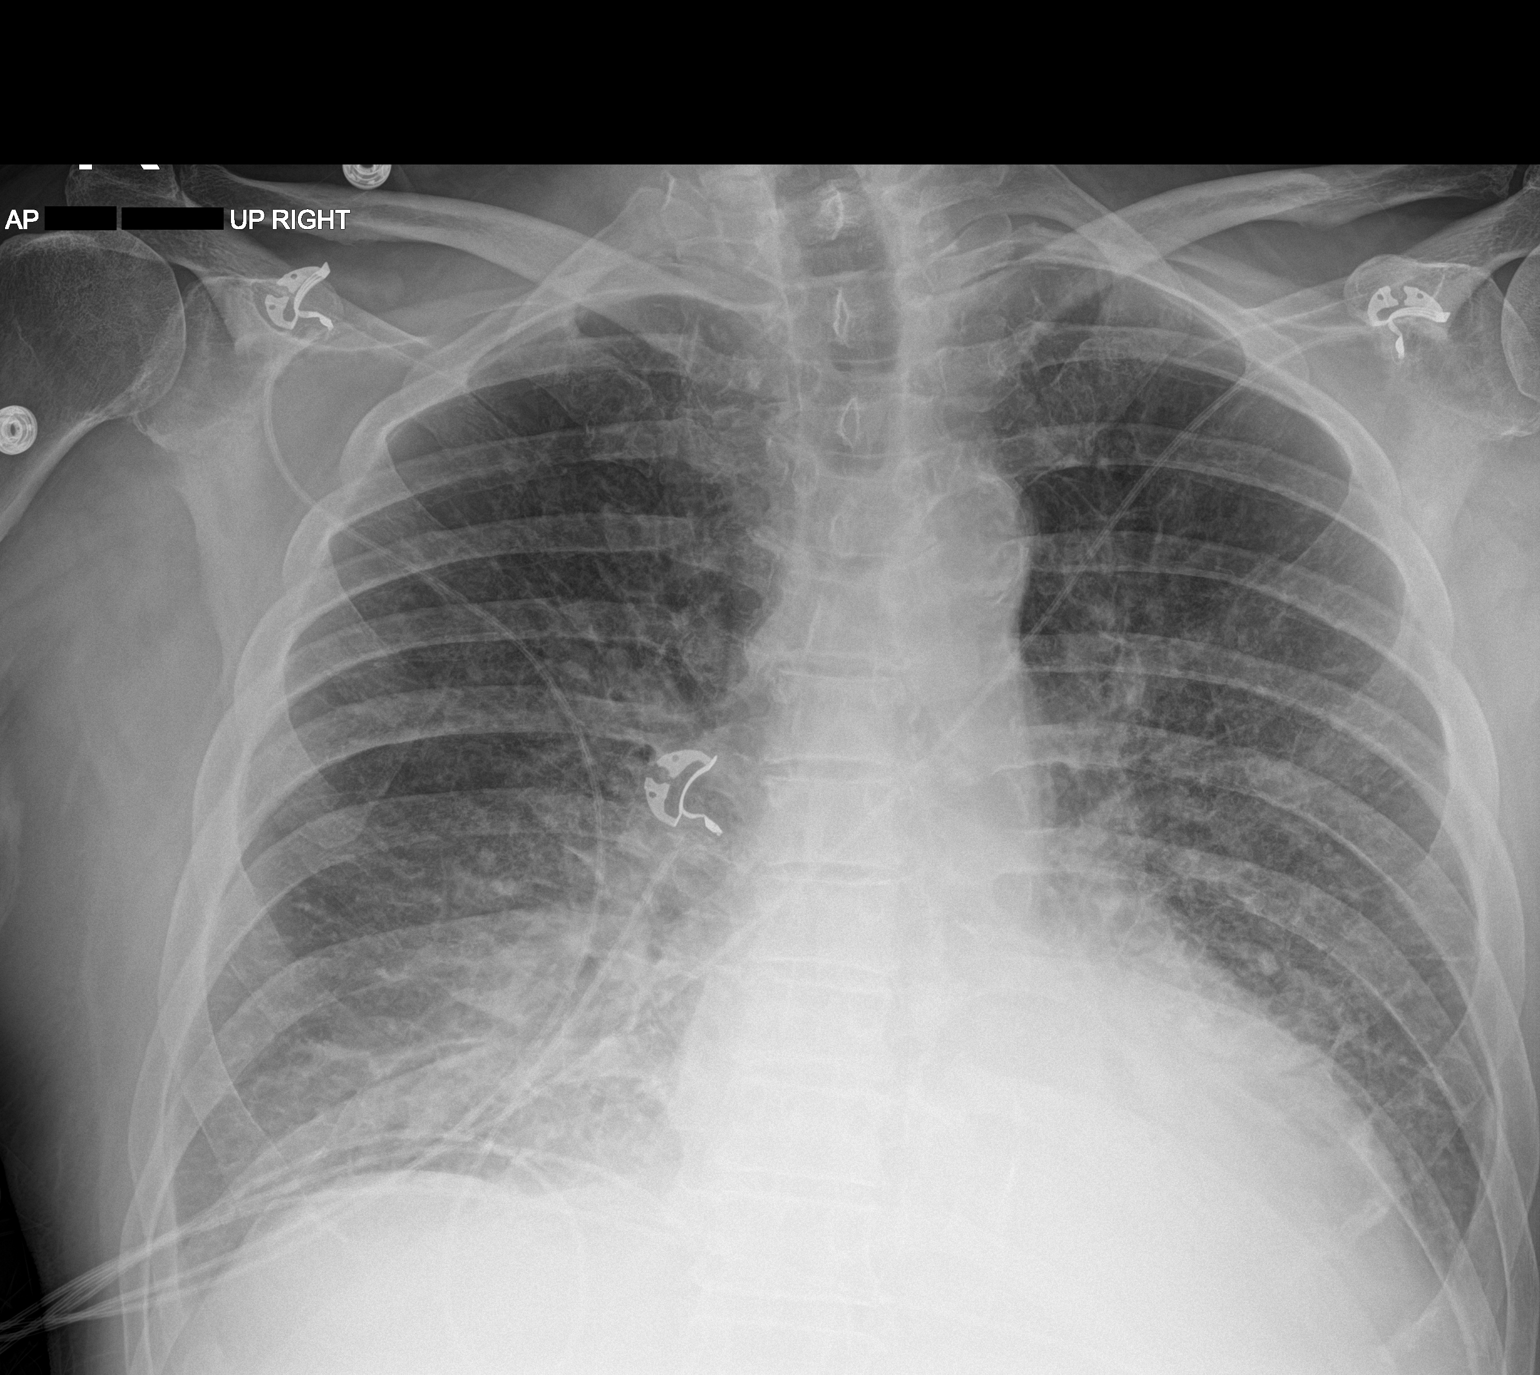

[1 of 1 positions shown; findings below may reference images not displayed]

FINDINGS: The heart size and pulmonary vascularity are within normal limits.
Tortuosity and calcification of the thoracic aorta. Hazy bilateral
pulmonary infiltrates primarily in the lower lobes with increased
consolidation at the left lung base since the prior study. No
definable effusions. Faint Kerley B-lines at the lung bases.
IMPRESSION: Findings are consistent with bilateral pulmonary edema, slightly
progressed at the left lung base.

Aortic Atherosclerosis ([id number]).

## 2019-05-24 IMAGING — CR CHEST - 2 VIEW
2 series · 2 of 2 positions shown · non-contrast
Comparison: 06/03/2018

CLINICAL DATA: Shortness of breath and history of pulmonary edema
and respiratory failure.

EXAM:
CHEST - 2 VIEW

[chest lat]
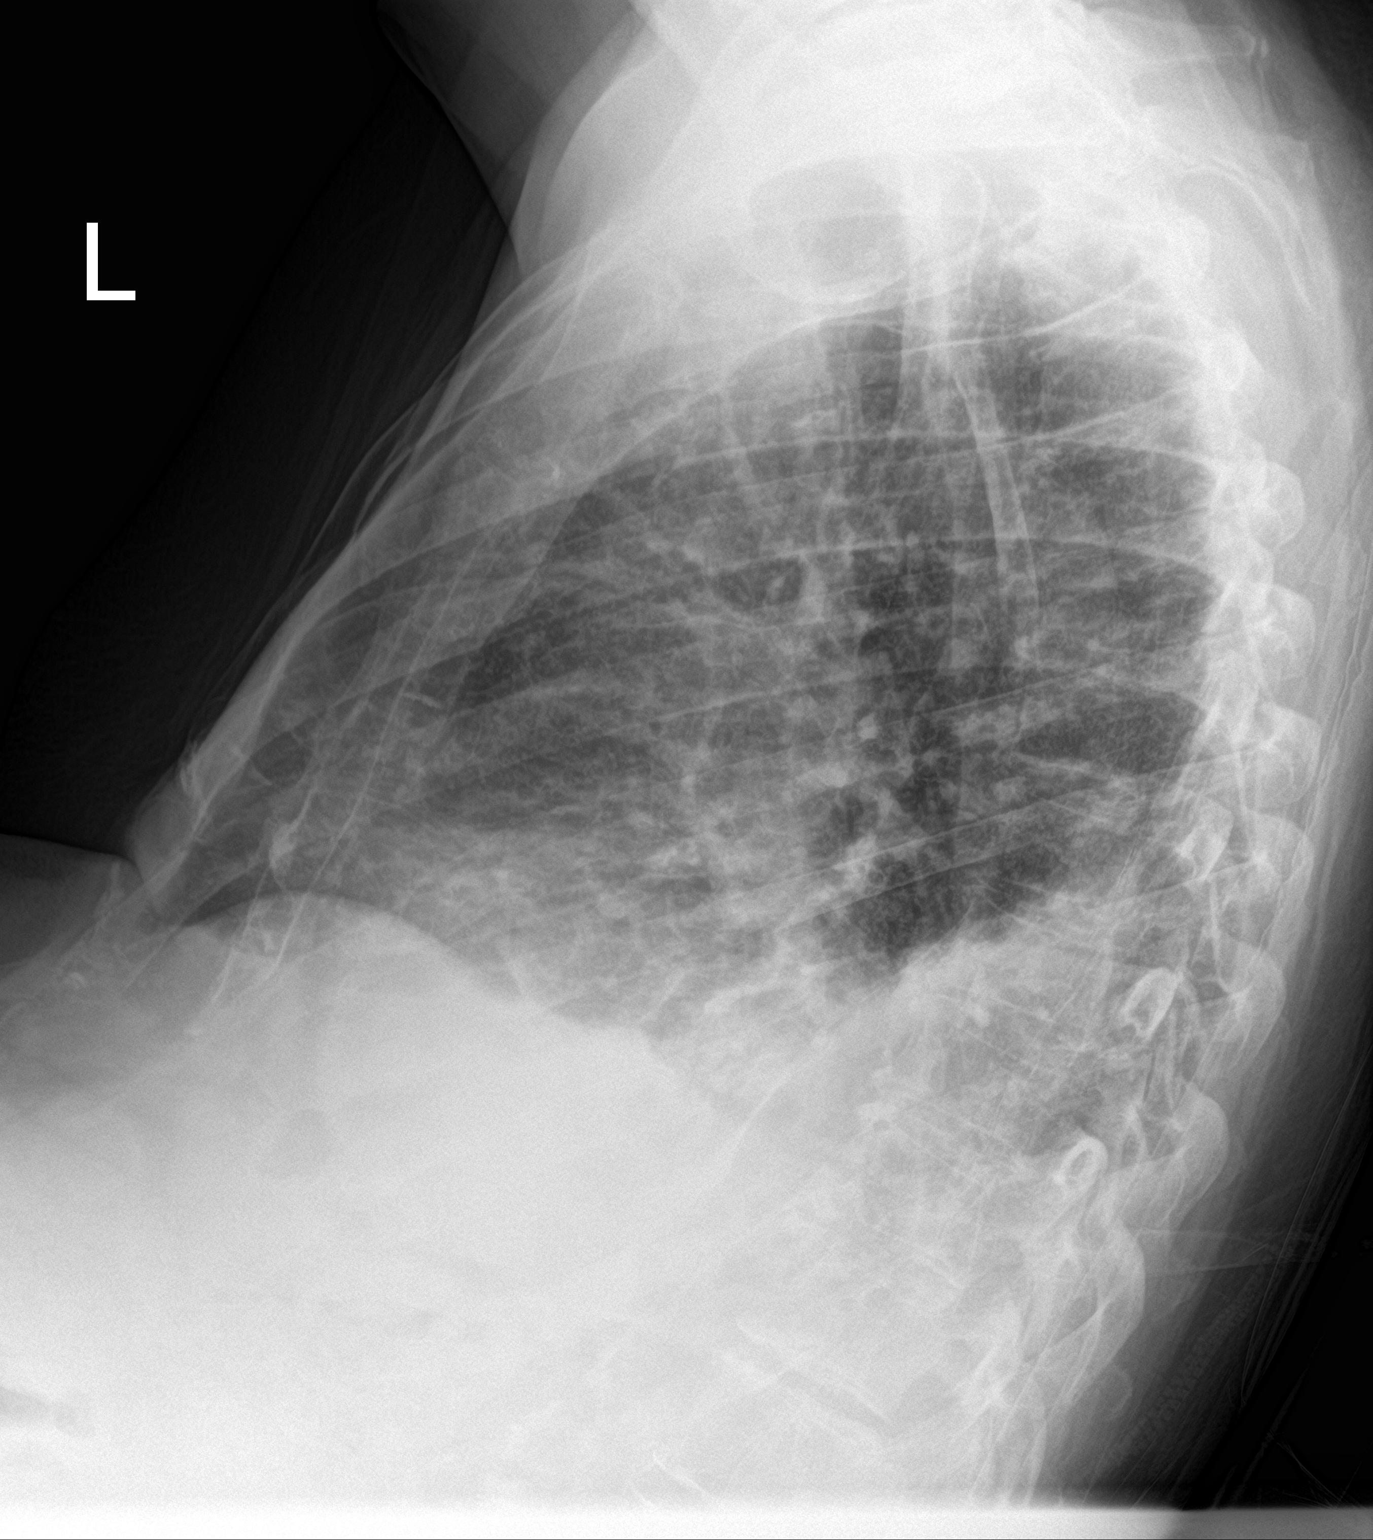

[chest ap]
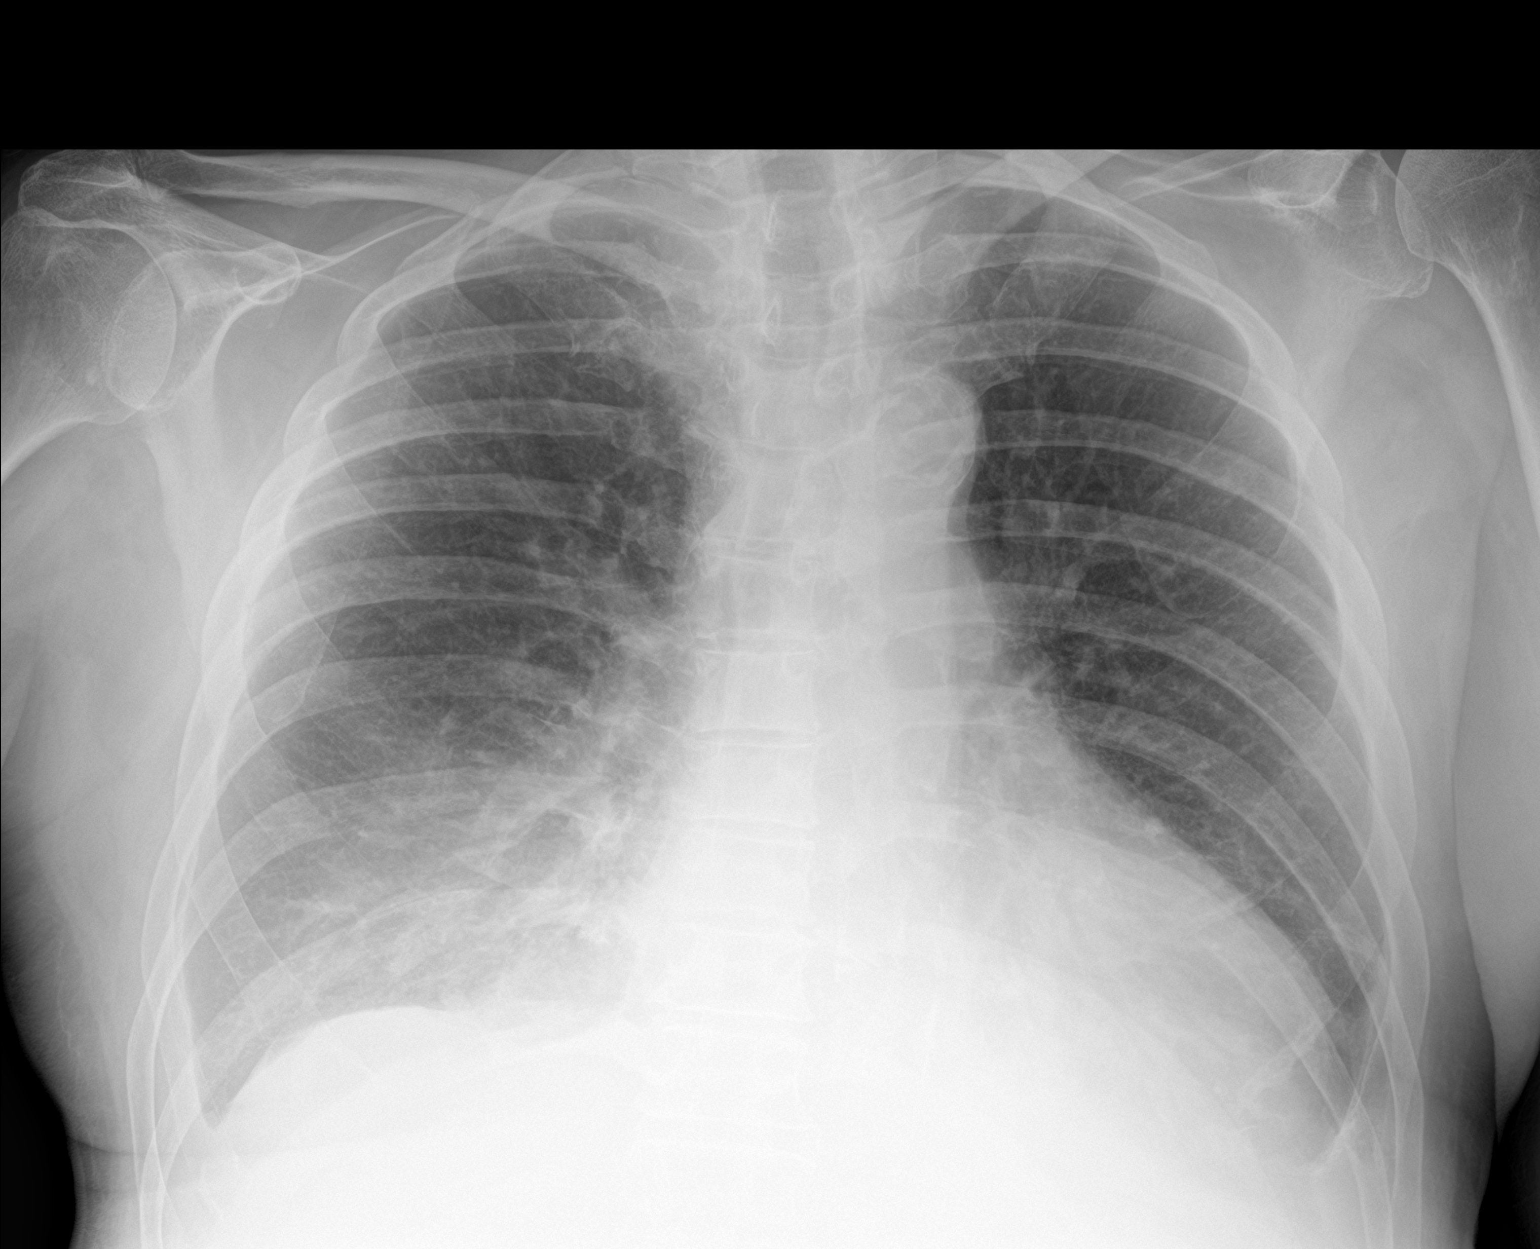

[2 of 2 positions shown; findings below may reference images not displayed]

FINDINGS: Stable mild cardiac enlargement. Degree of interstitial edema
appears improved since the prior x-ray. There is some probable
atelectasis in both lower lung zones, right greater than left. There
are small bilateral pleural effusions seen best on the lateral view.
No pneumothorax.
IMPRESSION: Stable cardiomegaly. Decrease in interstitial edema since the prior
study with persistent atelectasis in both lower lung zones, right
greater than left and small bilateral pleural effusions.
